# Patient Record
Sex: Female | Born: 1965
Health system: Southern US, Community
[De-identification: ages and names within clinical notes are randomized; demographics above are authoritative.]

## PROBLEM LIST (undated history)

## (undated) DIAGNOSIS — Z8042 Family history of malignant neoplasm of prostate: Secondary | ICD-10-CM

## (undated) DIAGNOSIS — F329 Major depressive disorder, single episode, unspecified: Secondary | ICD-10-CM

## (undated) DIAGNOSIS — F419 Anxiety disorder, unspecified: Secondary | ICD-10-CM

## (undated) DIAGNOSIS — C50411 Malignant neoplasm of upper-outer quadrant of right female breast: Principal | ICD-10-CM

## (undated) DIAGNOSIS — J302 Other seasonal allergic rhinitis: Secondary | ICD-10-CM

## (undated) DIAGNOSIS — Z9889 Other specified postprocedural states: Secondary | ICD-10-CM

## (undated) DIAGNOSIS — R112 Nausea with vomiting, unspecified: Secondary | ICD-10-CM

## (undated) DIAGNOSIS — I10 Essential (primary) hypertension: Secondary | ICD-10-CM

## (undated) DIAGNOSIS — Z803 Family history of malignant neoplasm of breast: Secondary | ICD-10-CM

## (undated) DIAGNOSIS — F32A Depression, unspecified: Secondary | ICD-10-CM

## (undated) HISTORY — DX: Depression, unspecified: F32.A

## (undated) HISTORY — PX: WISDOM TOOTH EXTRACTION: SHX21

## (undated) HISTORY — PX: FOOT NEUROMA SURGERY: SHX646

## (undated) HISTORY — DX: Anxiety disorder, unspecified: F41.9

## (undated) HISTORY — PX: TONSILLECTOMY: SUR1361

## (undated) HISTORY — DX: Family history of malignant neoplasm of breast: Z80.3

## (undated) HISTORY — DX: Major depressive disorder, single episode, unspecified: F32.9

## (undated) HISTORY — DX: Family history of malignant neoplasm of prostate: Z80.42

## (undated) HISTORY — DX: Malignant neoplasm of upper-outer quadrant of right female breast: C50.411

---

## 1998-10-22 ENCOUNTER — Other Ambulatory Visit: Admission: RE | Admit: 1998-10-22 | Discharge: 1998-10-22 | Payer: Self-pay

## 1999-11-29 ENCOUNTER — Other Ambulatory Visit: Admission: RE | Admit: 1999-11-29 | Discharge: 1999-11-29 | Payer: Self-pay | Admitting: Obstetrics and Gynecology

## 2000-12-01 ENCOUNTER — Other Ambulatory Visit: Admission: RE | Admit: 2000-12-01 | Discharge: 2000-12-01 | Payer: Self-pay | Admitting: Obstetrics and Gynecology

## 2001-12-02 ENCOUNTER — Other Ambulatory Visit: Admission: RE | Admit: 2001-12-02 | Discharge: 2001-12-02 | Payer: Self-pay | Admitting: Obstetrics and Gynecology

## 2002-12-05 ENCOUNTER — Other Ambulatory Visit: Admission: RE | Admit: 2002-12-05 | Discharge: 2002-12-05 | Payer: Self-pay | Admitting: Obstetrics and Gynecology

## 2003-12-15 ENCOUNTER — Other Ambulatory Visit: Admission: RE | Admit: 2003-12-15 | Discharge: 2003-12-15 | Payer: Self-pay | Admitting: Obstetrics and Gynecology

## 2005-11-24 ENCOUNTER — Ambulatory Visit (HOSPITAL_COMMUNITY): Admission: RE | Admit: 2005-11-24 | Discharge: 2005-11-24 | Payer: Self-pay | Admitting: *Deleted

## 2005-12-02 ENCOUNTER — Ambulatory Visit (HOSPITAL_COMMUNITY): Admission: RE | Admit: 2005-12-02 | Discharge: 2005-12-02 | Payer: Self-pay | Admitting: *Deleted

## 2005-12-08 ENCOUNTER — Inpatient Hospital Stay (HOSPITAL_COMMUNITY): Admission: AD | Admit: 2005-12-08 | Discharge: 2005-12-12 | Payer: Self-pay | Admitting: Obstetrics and Gynecology

## 2008-08-16 ENCOUNTER — Ambulatory Visit (HOSPITAL_COMMUNITY): Admission: RE | Admit: 2008-08-16 | Discharge: 2008-08-16 | Payer: Self-pay | Admitting: Obstetrics & Gynecology

## 2010-03-25 HISTORY — PX: ABDOMINAL HYSTERECTOMY: SHX81

## 2010-04-02 ENCOUNTER — Ambulatory Visit (HOSPITAL_COMMUNITY): Admission: RE | Admit: 2010-04-02 | Discharge: 2010-04-03 | Payer: Self-pay | Admitting: Obstetrics & Gynecology

## 2010-04-02 ENCOUNTER — Encounter (INDEPENDENT_AMBULATORY_CARE_PROVIDER_SITE_OTHER): Payer: Self-pay | Admitting: Obstetrics & Gynecology

## 2010-09-15 ENCOUNTER — Encounter: Payer: Self-pay | Admitting: *Deleted

## 2010-11-08 LAB — DIFFERENTIAL
Basophils Absolute: 0 10*3/uL (ref 0.0–0.1)
Eosinophils Absolute: 0.1 10*3/uL (ref 0.0–0.7)
Eosinophils Relative: 1 % (ref 0–5)

## 2010-11-08 LAB — CBC
HCT: 40.3 % (ref 36.0–46.0)
Hemoglobin: 12.3 g/dL (ref 12.0–15.0)
MCH: 33.5 pg (ref 26.0–34.0)
MCV: 95.7 fL (ref 78.0–100.0)
MCV: 96.2 fL (ref 78.0–100.0)
RBC: 4.18 MIL/uL (ref 3.87–5.11)
RDW: 13 % (ref 11.5–15.5)
WBC: 11.7 10*3/uL — ABNORMAL HIGH (ref 4.0–10.5)

## 2010-11-08 LAB — TYPE AND SCREEN: Antibody Screen: NEGATIVE

## 2010-11-08 LAB — ABO/RH: ABO/RH(D): A POS

## 2010-11-08 LAB — SURGICAL PCR SCREEN
MRSA, PCR: NEGATIVE
Staphylococcus aureus: POSITIVE — AB

## 2010-11-08 LAB — PREGNANCY, URINE: Preg Test, Ur: NEGATIVE

## 2011-01-07 NOTE — Op Note (Signed)
NAMEJACI, DESANTO             ACCOUNT NO.:  [id number]   MEDICAL RECORD NO.:  [id number]          PATIENT TYPE:  AMB   LOCATION:  SDC                           FACILITY:  [facility]   PHYSICIAN:  Genia Del, M.D.DATE OF BIRTH:  [birth year]   DATE OF PROCEDURE:  DATE OF DISCHARGE:                               OPERATIVE REPORT   PREOPERATIVE DIAGNOSIS:  Intrauterine polyp, irregular bleeding.   POSTOPERATIVE DIAGNOSIS:  Intrauterine polyp, irregular bleeding.   PROCEDURE:  Hysteroscopy, resection, and dilatation and curettage.   SURGEON:  Genia Del, MD   PROCEDURE:  Under MAC analgesia, the patient is in lithotomy position.  She is prepped with Betadine on the suprapubic, vulvar, and vaginal  areas.  The vaginal exam reveals an anteverted uterus slightly deviated  to the right, mobile, no adnexal mass.  We inserted the speculum in the  vagina.  We proceeded with a paracervical block with Nesacaine 1% 20 mL  total at 4 and 8 o'clock.  The anterior lip of the cervix was grasped  with a tenaculum.  Dilatation of the cervix at Hegar dilator #31 without  difficulty.  We then inserted the operative hysteroscope with 1 loop.  We visualized the intrauterine cavity.  Few polyps were present on the  anterior wall of the uterus.  We resected those polyp after taking  pictures.  We then completed a curettage of the entire intrauterine  cavity with a sharp curette a lot of endometrial curettings including  more polyps were removed.  The specimen was sent altogether to  Pathology.  We then went back with the hysteroscope, visualized the  intrauterine cavity.  No other lesion were present.  Hemostasis was  adequate.  The instruments were therefore removed.  The estimated blood  loss was minimal.  The fluid deficit was 180 mL.  No complications  occurred, and the patient was brought to recovery room in good stable  status.  A dose of Ancef 1 g IV was given at induction.      Genia Del, M.D.  Electronically Signed     ML/MEDQ  D:  08/16/2008  T:  08/16/2008  Job:  [id number]

## 2011-05-30 LAB — CBC
MCHC: 34.2 g/dL (ref 30.0–36.0)
MCV: 96.8 fL (ref 78.0–100.0)
Platelets: 353 10*3/uL (ref 150–400)
RDW: 12.4 % (ref 11.5–15.5)
WBC: 8.1 10*3/uL (ref 4.0–10.5)

## 2011-05-30 LAB — PREGNANCY, URINE: Preg Test, Ur: NEGATIVE

## 2012-06-22 ENCOUNTER — Ambulatory Visit (INDEPENDENT_AMBULATORY_CARE_PROVIDER_SITE_OTHER): Payer: PRIVATE HEALTH INSURANCE | Admitting: Family Medicine

## 2012-06-22 VITALS — BP 110/74 | HR 75 | Temp 98.0°F | Resp 16 | Ht 69.0 in | Wt 175.0 lb

## 2012-06-22 DIAGNOSIS — Z0000 Encounter for general adult medical examination without abnormal findings: Secondary | ICD-10-CM

## 2012-06-22 LAB — POCT URINALYSIS DIPSTICK
Bilirubin, UA: NEGATIVE
Blood, UA: NEGATIVE
Glucose, UA: NEGATIVE
Ketones, UA: NEGATIVE
Leukocytes, UA: NEGATIVE
Nitrite, UA: NEGATIVE
Protein, UA: NEGATIVE
Spec Grav, UA: 1.01
Urobilinogen, UA: 0.2
pH, UA: 7

## 2012-06-22 LAB — POCT CBC
Granulocyte percent: 69.3 %G (ref 37–80)
HCT, POC: 43.8 % (ref 37.7–47.9)
Hemoglobin: 13.5 g/dL (ref 12.2–16.2)
Lymph, poc: 1.3 (ref 0.6–3.4)
MCH, POC: 31.1 pg (ref 27–31.2)
MCHC: 30.8 g/dL — AB (ref 31.8–35.4)
MCV: 100.9 fL — AB (ref 80–97)
MID (cbc): 0.3 (ref 0–0.9)
MPV: 8.6 fL (ref 0–99.8)
POC Granulocyte: 3.7 (ref 2–6.9)
POC LYMPH PERCENT: 25.4 %L (ref 10–50)
POC MID %: 5.3 %M (ref 0–12)
Platelet Count, POC: 325 10*3/uL (ref 142–424)
RBC: 4.34 M/uL (ref 4.04–5.48)
RDW, POC: 13.1 %
WBC: 5.3 10*3/uL (ref 4.6–10.2)

## 2012-06-22 LAB — LIPID PANEL
Cholesterol: 203 mg/dL — ABNORMAL HIGH (ref 0–200)
HDL: 48 mg/dL (ref >39)
LDL Cholesterol: 135 mg/dL — ABNORMAL HIGH (ref 0–99)
Total CHOL/HDL Ratio: 4.2 Ratio
Triglycerides: 100 mg/dL (ref <150)
VLDL: 20 mg/dL (ref 0–40)

## 2012-06-22 LAB — COMPREHENSIVE METABOLIC PANEL
ALT: 14 U/L (ref 0–35)
AST: 16 U/L (ref 0–37)
Albumin: 4.5 g/dL (ref 3.5–5.2)
Alkaline Phosphatase: 39 U/L (ref 39–117)
BUN: 9 mg/dL (ref 6–23)
CO2: 27 mEq/L (ref 19–32)
Calcium: 9.6 mg/dL (ref 8.4–10.5)
Chloride: 103 mEq/L (ref 96–112)
Creat: 0.83 mg/dL (ref 0.50–1.10)
Glucose, Bld: 89 mg/dL (ref 70–99)
Potassium: 4.5 mEq/L (ref 3.5–5.3)
Sodium: 139 mEq/L (ref 135–145)
Total Bilirubin: 0.5 mg/dL (ref 0.3–1.2)
Total Protein: 6.8 g/dL (ref 6.0–8.3)

## 2012-06-22 LAB — POCT UA - MICROSCOPIC ONLY
Casts, Ur, LPF, POC: NEGATIVE
Crystals, Ur, HPF, POC: NEGATIVE
Mucus, UA: NEGATIVE
RBC, urine, microscopic: NEGATIVE
Yeast, UA: NEGATIVE

## 2012-06-22 LAB — TSH: TSH: 1.23 u[IU]/mL (ref 0.350–4.500)

## 2012-06-22 NOTE — Progress Notes (Signed)
@[email or web address] @  Patient ID: Caitlin Monroe MRN: [id number], DOB: 07/25/1966, 46 y.o. Date of Encounter: 06/22/2012, 8:30 AM  Primary Physician: No primary provider on file.  Chief Complaint: Physical (CPE)  HPI: 46 y.o. y/o female with history of noted below here for CPE.  Doing well. No issues/complaints.  Pap:  3 years MMG:  2012 Review of Systems: Consitutional: No fever, chills, fatigue, night sweats, lymphadenopathy, or weight changes. Eyes: No visual changes, eye redness, or discharge. ENT/Mouth: Ears: No otalgia, tinnitus, hearing loss, discharge. Nose: No congestion, rhinorrhea, sinus pain, or epistaxis. Throat: No sore throat, post nasal drip, or teeth pain. Cardiovascular: No CP, palpitations, diaphoresis, DOE, edema, orthopnea, PND. Respiratory: No cough, hemoptysis, SOB, or wheezing. Gastrointestinal: No anorexia, dysphagia, reflux, pain, nausea, vomiting, hematemesis, diarrhea, constipation, BRBPR, or melena. Breast: No discharge, pain, swelling, or mass. Genitourinary: No dysuria, frequency, urgency, hematuria, incontinence, nocturia, amenorrhea, vaginal discharge, pruritis, burning, abnormal bleeding, or pain. Musculoskeletal: No decreased ROM, myalgias, stiffness, joint swelling, or weakness. Skin: No rash, erythema, lesion changes, pain, warmth, jaundice, or pruritis. Neurological: No headache, dizziness, syncope, seizures, tremors, memory loss, coordination problems, or paresthesias. Psychological: No anxiety, depression, hallucinations, SI/HI. Endocrine: No fatigue, polydipsia, polyphagia, polyuria, or known diabetes. All other systems were reviewed and are otherwise negative.  Past Medical History  Diagnosis Date  . Depression      Past Surgical History  Procedure Date  . Abdominal hysterectomy     Home Meds:  Prior to Admission medications   Medication Sig Start Date End Date Taking? Authorizing Provider  buPROPion (WELLBUTRIN SR) 150 MG 12 hr tablet  Take 150 mg by mouth daily.   Yes Historical Provider, MD  sertraline (ZOLOFT) 100 MG tablet Take 150 mg by mouth daily.   Yes Historical Provider, MD    Allergies: No Known Allergies  History   Social History  . Marital Status: Married    Spouse Name: N/A    Number of Children: N/A  . Years of Education: N/A   Occupational History  . Not on file.   Social History Main Topics  . Smoking status: Former Smoker    Quit date: 06/23/1995  . Smokeless tobacco: Not on file  . Alcohol Use: Not on file  . Drug Use: Not on file  . Sexually Active: Not on file   Other Topics Concern  . Not on file   Social History Narrative  . No narrative on file    Family History  Problem Relation Age of Onset  . Hypertension Mother   . Cancer Father   . Mental illness Sister   . Cancer Brother   . Cancer Maternal Grandmother   . Cancer Maternal Grandfather   . Cancer Paternal Grandmother     Physical Exam: Blood pressure 110/74, pulse 75, temperature 98 F (36.7 C), temperature source Oral, resp. rate 16, height 5\' 9"  (1.753 m), weight 175 lb (79.379 kg), SpO2 99.00%., Body mass index is 25.84 kg/(m^2). General: Well developed, well nourished, in no acute distress. HEENT: Normocephalic, atraumatic. Conjunctiva pink, sclera non-icteric. Pupils 2 mm constricting to 1 mm, round, regular, and equally reactive to light and accomodation. EOMI. Internal auditory canal clear. TMs with good cone of light and without pathology. Nasal mucosa pink. Nares are without discharge. No sinus tenderness. Oral mucosa pink. Dentition excellent. Pharynx without exudate.   Neck: Supple. Trachea midline. No thyromegaly. Full ROM. No lymphadenopathy. Lungs: Clear to auscultation bilaterally without wheezes, rales, or rhonchi. Breathing is of normal effort and  unlabored. Cardiovascular: RRR with S1 S2. No murmurs, rubs, or gallops appreciated. Distal pulses 2+ symmetrically. No carotid or abdominal bruits. Breast:  Symmetrical. No masses. Nipples without discharge. Abdomen: Soft, non-tender, non-distended with normoactive bowel sounds. No hepatosplenomegaly or masses. No rebound/guarding. No CVA tenderness. Without hernias. Musculoskeletal: Full range of motion and 5/5 strength throughout. Without swelling, atrophy, tenderness, crepitus, or warmth. Extremities without clubbing, cyanosis, or edema. Calves supple. Skin: Warm and moist without erythema, ecchymosis, wounds, or rash. Neuro: A+Ox3. CN II-XII grossly intact. Moves all extremities spontaneously. Full sensation throughout. Normal gait. DTR 2+ throughout upper and lower extremities. Finger to nose intact. Psych:  Responds to questions appropriately with a normal affect.   Studies: CBC, CMET, Lipid, TSH, Vitamin D all pending.  UA:   Assessment/Plan:  46 y.o. y/o female here for CPE -  Signed, Elvina Sidle, MD 06/22/2012 8:30 AM

## 2012-08-16 ENCOUNTER — Ambulatory Visit (INDEPENDENT_AMBULATORY_CARE_PROVIDER_SITE_OTHER): Payer: PRIVATE HEALTH INSURANCE | Admitting: Family Medicine

## 2012-08-16 VITALS — BP 128/80 | HR 114 | Temp 99.5°F | Resp 16 | Ht 68.5 in | Wt 170.0 lb

## 2012-08-16 DIAGNOSIS — J029 Acute pharyngitis, unspecified: Secondary | ICD-10-CM

## 2012-08-16 MED ORDER — CEFDINIR 300 MG PO CAPS: 600.0000 mg | ORAL_CAPSULE | Freq: Every day | ORAL | Status: DC

## 2012-08-16 NOTE — Progress Notes (Signed)
Patient ID: Caitlin OLBERDING MRN: [id number], DOB: 06-01-1966, 46 y.o. Date of Encounter: 08/16/2012, 6:49 PM  Primary Physician: No primary provider on file.  Chief Complaint:  Chief Complaint  Patient presents with  . Sore Throat    Daughter has had strep 3 x in the last month and now she thinks she has it; started having a ST today  . Fever    was 100.8 at 330 and she took some ibuprofen    HPI: 46 y.o. year old female presents with day history of sore throat. Subjective fever and chills. No cough, congestion, rhinorrhea, sinus pressure, otalgia, or headache. Normal hearing. No GI complaints. Able to swallow saliva, but hurts to do so. Decreased appetite secondary to sore throat.   Past Medical History  Diagnosis Date  . Depression      Home Meds: Prior to Admission medications   Medication Sig Start Date End Date Taking? Authorizing Provider  buPROPion (WELLBUTRIN SR) 150 MG 12 hr tablet Take 150 mg by mouth daily.   Yes Historical Provider, MD  hydrOXYzine (ATARAX/VISTARIL) 25 MG tablet Take 25 mg by mouth 3 (three) times daily as needed.   Yes Historical Provider, MD  sertraline (ZOLOFT) 100 MG tablet Take 150 mg by mouth daily.   Yes Historical Provider, MD    Allergies: No Known Allergies  History   Social History  . Marital Status: Married    Spouse Name: N/A    Number of Children: N/A  . Years of Education: N/A   Occupational History  . Not on file.   Social History Main Topics  . Smoking status: Former Smoker    Quit date: 06/23/1995  . Smokeless tobacco: Not on file  . Alcohol Use: Not on file  . Drug Use: Not on file  . Sexually Active: Not on file   Other Topics Concern  . Not on file   Social History Narrative  . No narrative on file     Review of Systems: Constitutional: negative for chills, fever, night sweats or weight changes HEENT: see above Cardiovascular: negative for chest pain or palpitations Respiratory: negative for  hemoptysis, wheezing, or shortness of breath Abdominal: negative for abdominal pain, nausea, vomiting or diarrhea Dermatological: negative for rash Neurologic: negative for headache   Physical Exam: Blood pressure 128/80, pulse 114, temperature 99.5 F (37.5 C), resp. rate 16, height 5' 8.5" (1.74 m), weight 170 lb (77.111 kg)., Body mass index is 25.47 kg/(m^2). General: Well developed, well nourished, in no acute distress. Head: Normocephalic, atraumatic, eyes without discharge, sclera non-icteric, nares are patent. Bilateral auditory canals clear, TM's are without perforation, pearly grey with reflective cone of light bilaterally. No sinus TTP. Oral cavity moist, dentition normal. Posterior pharynx with post nasal drip and mild erythema. No peritonsillar abscess or tonsillar exudate. Neck: Supple. No thyromegaly. Full ROM. No lymphadenopathy. Lungs: Clear bilaterally to auscultation without wheezes, rales, or rhonchi. Breathing is unlabored. Heart: RRR with S1 S2. No murmurs, rubs, or gallops appreciated. Abdomen: Soft, non-tender, non-distended with normoactive bowel sounds. No hepatomegaly. No rebound/guarding. No obvious abdominal masses. Msk:  Strength and tone normal for age. Extremities: No clubbing or cyanosis. No edema. Neuro: Alert and oriented X 3. Moves all extremities spontaneously. CNII-XII grossly in tact. Psych:  Responds to questions appropriately with a normal affect.      ASSESSMENT AND PLAN:  46 y.o. year old female with  - -Tylenol/Motrin prn -Rest/fluids -RTC precautions -RTC 3-5 days if no  improvement  Signed, Elvina Sidle, MD 08/16/2012 6:49 PM

## 2012-08-16 NOTE — Patient Instructions (Signed)

## 2012-08-20 LAB — CULTURE, GROUP A STREP: Organism ID, Bacteria: NORMAL

## 2013-03-17 ENCOUNTER — Telehealth: Payer: Self-pay | Admitting: Radiology

## 2013-03-17 NOTE — Telephone Encounter (Signed)
Medical Records: Can we please provide this for the pt and give her a call once completed? Thank you so much!  The following email was submitted to your website from [employer] I would like to get a copy of my most recent physical (06/22/12), which I need to submit for matriculation into graduate school. What steps do I need to take to get access to this? Thank you!

## 2013-03-18 NOTE — Telephone Encounter (Signed)
In the mailbox waiting to be mailed. Notified patient and she gave verbal consent to have them mailed and processed for graduate school.

## 2013-04-05 ENCOUNTER — Ambulatory Visit (INDEPENDENT_AMBULATORY_CARE_PROVIDER_SITE_OTHER): Payer: PRIVATE HEALTH INSURANCE | Admitting: Family Medicine

## 2013-04-05 ENCOUNTER — Encounter: Payer: Self-pay | Admitting: Family Medicine

## 2013-04-05 VITALS — BP 112/68 | HR 88 | Temp 98.1°F | Resp 16 | Ht 68.5 in | Wt 171.0 lb

## 2013-04-05 DIAGNOSIS — Z130 Encounter for screening for diseases of the blood and blood-forming organs and certain disorders involving the immune mechanism: Secondary | ICD-10-CM

## 2013-04-05 DIAGNOSIS — F418 Other specified anxiety disorders: Secondary | ICD-10-CM | POA: Insufficient documentation

## 2013-04-05 DIAGNOSIS — Z23 Encounter for immunization: Secondary | ICD-10-CM

## 2013-04-05 DIAGNOSIS — J309 Allergic rhinitis, unspecified: Secondary | ICD-10-CM | POA: Insufficient documentation

## 2013-04-05 NOTE — Progress Notes (Signed)
S:  This 47 y.o. Cauc female is here for [employer] pre-admission immunizations. She had CPE at [facility] in October 2013; exam was unremarkable. Pt requests Hepatitis B series and she needs a Tdap today. She will need titers for MMR and Varicella. She reports no new health issues since CPE. We briefly discussed her lipid panel results which showed and elevated LDL; pt thinks this is genetic. She and her husband practice good nutrition w/ fish oil and whole grain cereals.  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  PMHx, Soc Hx and Fam Hx reviewed.  ROS: Noncontributory.  O: Filed Vitals:   04/05/13 1431  BP: 112/68  Pulse: 88  Temp: 98.1 F (36.7 C)  Resp: 16   GEN: In NAD; WN,WD. HENT: [place]/AT; EOMI w/ clear conj/sclerae. EACs/nose/oroph clear. SKIN: W&D; no rashes, erythema, jaundice or pallor. COR: RRR. LUNGS: Normal resp rate and effort. MS: MAEs; no joint deformities, c/c/e. NEURO: A&O x 3; CNs intact. Nonfocal.  A/P: Need for prophylactic vaccination with combined diphtheria-tetanus-pertussis (DTP) vaccine - Plan: Tdap vaccine greater than or equal to 7yo IM  Need for prophylactic vaccination and inoculation against viral hepatitis - Plan: Hepatitis B vaccine adult IM; pt will return in 2 months for Hep B #2 and in 6 months for Hep B #3.  Screening for other and unspecified endocrine, nutritional, metabolic, and immunity disorders - Plan: Varicella zoster antibody, IgG, Measles/Mumps/Rubella Immunity

## 2013-04-05 NOTE — Patient Instructions (Signed)
You received Tdap and Hepatitis B #1 vaccines today. Return to [facility] in 2 months for Hep B #2 and in 6 months for Hep B #3.

## 2013-04-06 LAB — MEASLES/MUMPS/RUBELLA IMMUNITY: Rubeola IgG: 77.5 AU/mL — ABNORMAL HIGH (ref <25.00)

## 2014-07-19 ENCOUNTER — Ambulatory Visit (INDEPENDENT_AMBULATORY_CARE_PROVIDER_SITE_OTHER): Payer: PRIVATE HEALTH INSURANCE | Admitting: Family Medicine

## 2014-07-19 VITALS — BP 134/84 | HR 86 | Temp 98.0°F | Resp 16 | Ht 68.5 in | Wt 184.4 lb

## 2014-07-19 DIAGNOSIS — Z130 Encounter for screening for diseases of the blood and blood-forming organs and certain disorders involving the immune mechanism: Secondary | ICD-10-CM | POA: Diagnosis not present

## 2014-07-19 DIAGNOSIS — E559 Vitamin D deficiency, unspecified: Secondary | ICD-10-CM

## 2014-07-19 DIAGNOSIS — Z0000 Encounter for general adult medical examination without abnormal findings: Secondary | ICD-10-CM

## 2014-07-19 DIAGNOSIS — Z1329 Encounter for screening for other suspected endocrine disorder: Secondary | ICD-10-CM

## 2014-07-19 DIAGNOSIS — Z13220 Encounter for screening for lipoid disorders: Secondary | ICD-10-CM | POA: Diagnosis not present

## 2014-07-19 LAB — POCT CBC
Granulocyte percent: 71.8 % (ref 37–80)
HCT, POC: 42.8 % (ref 37.7–47.9)
Hemoglobin: 14.4 g/dL (ref 12.2–16.2)
Lymph, poc: 1.8 (ref 0.6–3.4)
MCH, POC: 32 pg — AB (ref 27–31.2)
MCHC: 33.5 g/dL (ref 31.8–35.4)
MCV: 95.3 fL (ref 80–97)
MID (cbc): 0.1 (ref 0–0.9)
MPV: 6.8 fL (ref 0–99.8)
POC Granulocyte: 4.8 (ref 2–6.9)
POC LYMPH PERCENT: 36.7 % (ref 10–50)
POC MID %: 1.5 % (ref 0–12)
Platelet Count, POC: 350 10*3/uL (ref 142–424)
RBC: 4.5 M/uL (ref 4.04–5.48)
RDW, POC: 12.8 %
WBC: 6.7 10*3/uL (ref 4.6–10.2)

## 2014-07-19 LAB — COMPLETE METABOLIC PANEL WITH GFR
BUN: 13 mg/dL (ref 6–23)
CO2: 26 mEq/L (ref 19–32)
Chloride: 104 mEq/L (ref 96–112)
GFR, Est African American: >89 mL/min
Glucose, Bld: 84 mg/dL (ref 70–99)
Potassium: 4.6 mEq/L (ref 3.5–5.3)
Sodium: 138 mEq/L (ref 135–145)
Total Bilirubin: 0.6 mg/dL (ref 0.2–1.2)

## 2014-07-19 LAB — COMPLETE METABOLIC PANEL WITHOUT GFR
ALT: 15 U/L (ref 0–35)
AST: 18 U/L (ref 0–37)
Albumin: 4.5 g/dL (ref 3.5–5.2)
Alkaline Phosphatase: 41 U/L (ref 39–117)
Calcium: 9.4 mg/dL (ref 8.4–10.5)
Creat: 0.77 mg/dL (ref 0.50–1.10)
GFR, Est Non African American: >89 mL/min
Total Protein: 7.2 g/dL (ref 6.0–8.3)

## 2014-07-19 LAB — LIPID PANEL
Cholesterol: 244 mg/dL — ABNORMAL HIGH (ref 0–200)
HDL: 55 mg/dL (ref >39)
LDL Cholesterol: 159 mg/dL — ABNORMAL HIGH (ref 0–99)
Total CHOL/HDL Ratio: 4.4 ratio
Triglycerides: 148 mg/dL (ref <150)
VLDL: 30 mg/dL (ref 0–40)

## 2014-07-19 LAB — TSH: TSH: 1.014 u[IU]/mL (ref 0.350–4.500)

## 2014-07-19 LAB — VITAMIN D 25 HYDROXY (VIT D DEFICIENCY, FRACTURES): Vit D, 25-Hydroxy: 39 ng/mL (ref 30–100)

## 2014-07-19 NOTE — Progress Notes (Signed)
Chief Complaint:  Chief Complaint  Patient presents with  . Annual Exam    wants to get a lipid and cmp--for insurance purposes    HPI: Caitlin Monroe is a 48 y.o. female who is here for  An annual exam Has had hysterectomy for Benign fibroid,  no paps are needed Last saw her Ob/gyn in October, had breast exam done then, UTD on Mammogram  Doing well overall, no complaitns.  Eat well healthy vegetable , exercise 2 times a week, she is law school.  She is in Sports coach school, want to pursue Possibly immigration.women's right UTD on flu vaccine UTD on TdaP    Past Medical History  Diagnosis Date  . Depression   . Allergy   . Anxiety    Past Surgical History  Procedure Laterality Date  . Abdominal hysterectomy  Aug 2011    [employer] TAH for fibroids   History   Social History  . Marital Status: Married    Spouse Name: N/A    Number of Children: N/A  . Years of Education: N/A   Occupational History  . Law Student    Social History Main Topics  . Smoking status: Former Smoker    Quit date: 06/23/1995  . Smokeless tobacco: None  . Alcohol Use: No  . Drug Use: No  . Sexual Activity: Yes   Other Topics Concern  . None   Social History Narrative   Married. Education: [employer]. Exercise: Gym 3 times a week for 45 minutes.                        Family History  Problem Relation Age of Onset  . Hypertension Mother   . Depression Mother   . Cancer Father     prostate  . Mental illness Sister   . Cancer Brother   . Cancer Maternal Grandmother   . Cancer Maternal Grandfather   . Heart disease Maternal Grandfather   . Cancer Paternal Grandmother     lung  . Cancer Paternal Grandfather   . Allergies Daughter   . Anxiety disorder Daughter    No Known Allergies Prior to Admission medications   Medication Sig Start Date End Date Taking? Authorizing Provider  buPROPion (WELLBUTRIN SR) 150 MG 12 hr tablet Take 150 mg by mouth daily.   Yes Historical  Provider, MD  hydrOXYzine (ATARAX/VISTARIL) 25 MG tablet Take 25 mg by mouth 3 (three) times daily as needed.   Yes Historical Provider, MD  sertraline (ZOLOFT) 100 MG tablet Take 150 mg by mouth daily.   Yes Historical Provider, MD     ROS: The patient denies fevers, chills, night sweats, unintentional weight loss, chest pain, palpitations, wheezing, dyspnea on exertion, nausea, vomiting, abdominal pain, dysuria, hematuria, melena, numbness, weakness, or tingling.   All other systems have been reviewed and were otherwise negative with the exception of those mentioned in the HPI and as above.    PHYSICAL EXAM: Filed Vitals:   07/19/14 0811  BP: 134/84  Pulse: 86  Temp: 98 F (36.7 C)  Resp: 16   Filed Vitals:   07/19/14 0811  Height: 5' 8.5" (1.74 m)  Weight: 184 lb 6.4 oz (83.643 kg)   Body mass index is 27.63 kg/(m^2).  General: Alert, no acute distress HEENT:  Normocephalic, atraumatic, oropharynx patent. EOMI, PERRLA. EOMI, PERRLA, fundo exam normal, neg thryoidmegaly Cardiovascular:  Regular rate and rhythm, no rubs murmurs or gallops.  No Carotid bruits, radial pulse intact. No pedal edema.  Respiratory: Clear to auscultation bilaterally.  No wheezes, rales, or rhonchi.  No cyanosis, no use of accessory musculature GI: No organomegaly, abdomen is soft and non-tender, positive bowel sounds.  No masses. Skin: No rashes. Neurologic: Facial musculature symmetric. Psychiatric: Patient is appropriate throughout our interaction. Lymphatic: No cervical lymphadenopathy Musculoskeletal: Gait intact. UE and LE normal, /5/ sterngth, 2/2 DTRS   LABS: Results for orders placed or performed in visit on 04/05/13  Varicella zoster antibody, IgG  Result Value Ref Range   Varicella IgG 214.30 (H) <135.00 Index  Measles/Mumps/Rubella Immunity  Result Value Ref Range   Rubella 1.27 (H) <0.90 Index   Mumps IgG 48.00 (H) <9.00 AU/mL   Rubeola IgG 77.50 (H) <25.00 AU/mL     EKG/XRAY:    Primary read interpreted by Dr. Marin Comment at [facility].   ASSESSMENT/PLAN: Encounter Diagnoses  Name Primary?  . Annual physical exam Yes  . Screening for deficiency anemia   . Screening for hyperlipidemia   . Screening for thyroid disorder   . Vitamin D deficiency    Annual labs pending Declined Flu F/u prn , otherwise in 1 year  Gross sideeffects, risk and benefits, and alternatives of medications d/w patient. Patient is aware that all medications have potential sideeffects and we are unable to predict every sideeffect or drug-drug interaction that may occur.  LE, [place], DO 07/19/2014 9:11 AM

## 2015-06-27 ENCOUNTER — Encounter: Payer: Self-pay | Admitting: Family Medicine

## 2015-06-27 ENCOUNTER — Ambulatory Visit (INDEPENDENT_AMBULATORY_CARE_PROVIDER_SITE_OTHER): Payer: PRIVATE HEALTH INSURANCE | Admitting: Family Medicine

## 2015-06-27 VITALS — BP 126/78 | HR 80 | Temp 98.4°F | Resp 16 | Ht 68.5 in | Wt 184.8 lb

## 2015-06-27 DIAGNOSIS — Z0000 Encounter for general adult medical examination without abnormal findings: Secondary | ICD-10-CM

## 2015-06-27 DIAGNOSIS — Z1329 Encounter for screening for other suspected endocrine disorder: Secondary | ICD-10-CM | POA: Diagnosis not present

## 2015-06-27 DIAGNOSIS — Z13220 Encounter for screening for lipoid disorders: Secondary | ICD-10-CM | POA: Diagnosis not present

## 2015-06-27 DIAGNOSIS — Z1231 Encounter for screening mammogram for malignant neoplasm of breast: Secondary | ICD-10-CM

## 2015-06-27 DIAGNOSIS — Z130 Encounter for screening for diseases of the blood and blood-forming organs and certain disorders involving the immune mechanism: Secondary | ICD-10-CM

## 2015-06-27 DIAGNOSIS — Z131 Encounter for screening for diabetes mellitus: Secondary | ICD-10-CM

## 2015-06-27 DIAGNOSIS — Z1321 Encounter for screening for nutritional disorder: Secondary | ICD-10-CM | POA: Diagnosis not present

## 2015-06-27 DIAGNOSIS — F338 Other recurrent depressive disorders: Secondary | ICD-10-CM | POA: Insufficient documentation

## 2015-06-27 DIAGNOSIS — F32A Depression, unspecified: Secondary | ICD-10-CM | POA: Insufficient documentation

## 2015-06-27 DIAGNOSIS — F39 Unspecified mood [affective] disorder: Secondary | ICD-10-CM | POA: Diagnosis not present

## 2015-06-27 DIAGNOSIS — F329 Major depressive disorder, single episode, unspecified: Secondary | ICD-10-CM | POA: Diagnosis not present

## 2015-06-27 LAB — CBC
HEMATOCRIT: 40.9 % (ref 36.0–46.0)
Hemoglobin: 13.7 g/dL (ref 12.0–15.0)
MCH: 32.5 pg (ref 26.0–34.0)
MCHC: 33.5 g/dL (ref 30.0–36.0)
MCV: 96.9 fL (ref 78.0–100.0)
MPV: 9.1 fL (ref 8.6–12.4)
PLATELETS: 339 10*3/uL (ref 150–400)
RBC: 4.22 MIL/uL (ref 3.87–5.11)
RDW: 13.1 % (ref 11.5–15.5)
WBC: 5.4 10*3/uL (ref 4.0–10.5)

## 2015-06-27 LAB — COMPREHENSIVE METABOLIC PANEL
ALK PHOS: 39 U/L (ref 33–115)
ALT: 14 U/L (ref 6–29)
AST: 19 U/L (ref 10–35)
Albumin: 4.6 g/dL (ref 3.6–5.1)
BUN: 14 mg/dL (ref 7–25)
CALCIUM: 9.1 mg/dL (ref 8.6–10.2)
CO2: 25 mmol/L (ref 20–31)
Chloride: 104 mmol/L (ref 98–110)
Creat: 0.85 mg/dL (ref 0.50–1.10)
GLUCOSE: 98 mg/dL (ref 65–99)
POTASSIUM: 4.4 mmol/L (ref 3.5–5.3)
Sodium: 139 mmol/L (ref 135–146)
Total Bilirubin: 0.7 mg/dL (ref 0.2–1.2)
Total Protein: 7 g/dL (ref 6.1–8.1)

## 2015-06-27 LAB — LIPID PANEL
CHOL/HDL RATIO: 4.6 ratio (ref <=5.0)
Cholesterol: 255 mg/dL — ABNORMAL HIGH (ref 125–200)
HDL: 55 mg/dL (ref >=46)
LDL CALC: 172 mg/dL — AB (ref <130)
Triglycerides: 140 mg/dL (ref <150)
VLDL: 28 mg/dL (ref <30)

## 2015-06-27 LAB — HEMOGLOBIN A1C
Hgb A1c MFr Bld: 5.3 % (ref <5.7)
Mean Plasma Glucose: 105 mg/dL (ref <117)

## 2015-06-27 LAB — TSH: TSH: 1.048 u[IU]/mL (ref 0.350–4.500)

## 2015-06-27 MED ORDER — BUPROPION HCL ER (SR) 100 MG PO TB12: 100.0000 mg | ORAL_TABLET | Freq: Every day | ORAL | Status: DC

## 2015-06-27 NOTE — Patient Instructions (Addendum)
It was good to see you today!   I will be in touch with your labs asap Continue to take good care of yourself as the days get shorter- if you need any help please let us know When you turn 50 it is time to get a colonoscopy- this can be scheduled with the GI doctor of your choice-  Consider [place], [place], [facility]   I refilled your wellbutrin SR- this is typically taken twice a day, but if once a day is working for you that is fine

## 2015-06-27 NOTE — Addendum Note (Signed)
Addended by: Wyatt Haste on: 06/27/2015 03:23 PM   Modules accepted: Orders

## 2015-06-27 NOTE — Progress Notes (Signed)
[facility] [address] [place] [address] [phone]  Date:  06/27/2015   Name:  Caitlin Monroe   DOB:  11-08-1965   MRN:  [id number]  PCP:  No primary care provider on file.    Chief Complaint: Annual Exam; Medication Refill; and Depression scale during triage   History of Present Illness:  Caitlin Monroe is a 49 y.o. very pleasant female patient who presents with the following:  Here today for a CPE History of abd hysterectomy in 2011 for fibroids.   She is on zoloft and also wellbutrin - she is on the wellbutrin SR but only takes it once a day. This is working well for her  Last seen here about a year ago She had a mammo per [facility].  This was done around this time last year and she will update this soon  She is fasting today for labs  She does tend to have some trouble with depression more around this time of year.  Her sister had bipolar disorder and committed suicide about 2 years ago. She herself does not have bipolar, but she is very careful with herself and monitors her depression sx closely  She uses a light box, exercises, does not drink, smoke or use any drugs.    She is studying law, and also has a 65 year old son who has some behavorial issues.  She still sees her OB-GYN yearly   Patient Active Problem List   Diagnosis Date Noted  . Allergic rhinitis 04/05/2013  . Depression with anxiety 04/05/2013    Past Medical History  Diagnosis Date  . Depression   . Allergy   . Anxiety     Past Surgical History  Procedure Laterality Date  . Abdominal hysterectomy  Aug 2011    Da Vinci TAH for fibroids    Social History  Substance Use Topics  . Smoking status: Former Smoker    Quit date: 06/23/1995  . Smokeless tobacco: None  . Alcohol Use: No    Family History  Problem Relation Age of Onset  . Hypertension Mother   . Depression Mother   . Hyperlipidemia Mother   . Heart disease Mother   . Cancer Father    prostate  . Mental illness Sister   . Hypertension Sister   . Cancer Brother   . Cancer Maternal Grandmother   . Mental illness Maternal Grandmother   . Cancer Maternal Grandfather   . Heart disease Maternal Grandfather   . Mental illness Maternal Grandfather   . Cancer Paternal Grandmother     lung  . Cancer Paternal Grandfather   . Allergies Daughter   . Anxiety disorder Daughter     No Known Allergies  Medication list has been reviewed and updated.  Current Outpatient Prescriptions on File Prior to Visit  Medication Sig Dispense Refill  . buPROPion (WELLBUTRIN SR) 150 MG 12 hr tablet Take 150 mg by mouth daily.    . hydrOXYzine (ATARAX/VISTARIL) 25 MG tablet Take 25 mg by mouth 3 (three) times daily as needed.    . sertraline (ZOLOFT) 100 MG tablet Take 150 mg by mouth daily.     No current facility-administered medications on file prior to visit.    Review of Systems:  As per HPI- otherwise negative.   Physical Examination: Filed Vitals:   06/27/15 0958  BP: 126/78  Pulse: 104  Temp: 98.4 F (36.9 C)  Resp: 16   Filed Vitals:  06/27/15 0958  Height: 5' 8.5" (1.74 m)  Weight: 184 lb 12.8 oz (83.825 kg)   Body mass index is 27.69 kg/(m^2). Ideal Body Weight: Weight in (lb) to have BMI = 25: 166.5  GEN: WDWN, NAD, Non-toxic, A & O x 3, mild overweight, looks well HEENT: Atraumatic, Normocephalic. Neck supple. No masses, No LAD.  Bilateral TM wnl, oropharynx normal.  PEERL,EOMI.   Ears and Nose: No external deformity. CV: RRR, No M/G/R. No JVD. No thrill. No extra heart sounds. PULM: CTA B, no wheezes, crackles, rhonchi. No retractions. No resp. distress. No accessory muscle use. ABD: S, NT, ND. No rebound. No HSM. EXTR: No c/c/e NEURO Normal gait.  PSYCH: Normally interactive. Conversant. Not depressed or anxious appearing.  Calm demeanor.    Assessment and Plan: Physical exam  Seasonal affective disorder ([place])  Encounter for vitamin deficiency  screening - Plan: Vitamin D, 25-hydroxy  Screening for hyperlipidemia - Plan: Lipid panel  Screening for diabetes mellitus - Plan: Comprehensive metabolic panel, Hemoglobin A1c  Screening for hypothyroidism - Plan: TSH  Screening for deficiency anemia - Plan: CBC  Depression - Plan: buPROPion (WELLBUTRIN SR) 100 MG 12 hr tablet   Noted a suspicious mole on her right upper back- she will see her derm soon to have this removed  Otherwise screening labs as above She sees a mental health professional but does need a RF of her wellbutrin so did this for her today See patient instructions for more details.     Signed Lamar Blinks, MD

## 2015-06-28 LAB — VITAMIN D 25 HYDROXY (VIT D DEFICIENCY, FRACTURES): VIT D 25 HYDROXY: 38 ng/mL (ref 30–100)

## 2015-10-24 ENCOUNTER — Ambulatory Visit: Payer: PRIVATE HEALTH INSURANCE | Admitting: Family Medicine

## 2016-01-31 ENCOUNTER — Encounter: Payer: Self-pay | Admitting: Family Medicine

## 2016-01-31 ENCOUNTER — Ambulatory Visit (INDEPENDENT_AMBULATORY_CARE_PROVIDER_SITE_OTHER): Payer: PRIVATE HEALTH INSURANCE | Admitting: Family Medicine

## 2016-01-31 VITALS — BP 103/72 | HR 96 | Temp 98.6°F | Resp 16 | Ht 68.5 in | Wt 186.6 lb

## 2016-01-31 DIAGNOSIS — Z0000 Encounter for general adult medical examination without abnormal findings: Secondary | ICD-10-CM | POA: Diagnosis not present

## 2016-01-31 NOTE — Patient Instructions (Addendum)
IF you received an x-ray today, you will receive an invoice from [facility]. Please contact [facility] at [phone] with questions or concerns regarding your invoice.   IF you received labwork today, you will receive an invoice from [employer]. Please contact [facility] at [phone] with questions or concerns regarding your invoice.   Our billing staff will not be able to assist you with questions regarding bills from these companies.  You will be contacted with the lab results as soon as they are available. The fastest way to get your results is to activate your My Chart account. Instructions are located on the last page of this paperwork. If you have not heard from [place] regarding the results in 2 weeks, please contact this office.    No change in meds for now. Can recheck lipids once you are back into your normal routine. If more recent labs needed for your paperwork - let me know and I can place a "lab only order" for you to return at your convenience.  Your neck pain may be due to a pinched nerve or nerve pain from the "brachial plexus".  No changes in treatment at this time, but you can follow up at your convenience to examine this further if needed.  Good luck on the exam!    Keeping You Healthy  Get These Tests  Blood Pressure- Have your blood pressure checked by your healthcare provider at least once a year.  Normal blood pressure is 120/80.  Weight- Have your body mass index (BMI) calculated to screen for obesity.  BMI is a measure of body fat based on height and weight.  You can calculate your own BMI at [employer]vel[employer].it  Cholesterol- Have your cholesterol checked every year.  Diabetes- Have your blood sugar checked every year if you have high blood pressure, high cholesterol, a family history of diabetes or if you are overweight.  Pap Test - Have a pap test every 1 to 5 years if you have been sexually active.  If  you are older than 65 and recent pap tests have been normal you may not need additional pap tests.  In addition, if you have had a hysterectomy  for benign disease additional pap tests are not necessary.  Mammogram-Yearly mammograms are essential for early detection of breast cancer  Screening for Colon Cancer- Colonoscopy starting at age 3. Screening may begin sooner depending on your family history and other health conditions.  Follow up colonoscopy as directed by your Gastroenterologist.  Screening for Osteoporosis- Screening begins at age 47 with bone density scanning, sooner if you are at higher risk for developing Osteoporosis.  Get these medicines  Calcium with Vitamin D- Your body requires 1200-1500 mg of Calcium a day and [phone] IU of Vitamin D a day.  You can only absorb 500 mg of Calcium at a time therefore Calcium must be taken in 2 or 3 separate doses throughout the day.  Hormones- Hormone therapy has been associated with increased risk for certain cancers and heart disease.  Talk to your healthcare provider about if you need relief from menopausal symptoms.  Aspirin- Ask your healthcare provider about taking Aspirin to prevent Heart Disease and Stroke.  Get these Immuniztions  Flu shot- Every fall  Pneumonia shot- Once after the age of 85; if you are younger ask your healthcare provider if you need a pneumonia shot.  Tetanus- Every ten years.  Zostavax- Once after the age of 71 to prevent  shingles.  Take these steps  Don't smoke- Your healthcare provider can help you quit. For tips on how to quit, ask your healthcare provider or go to www.smokefree.gov or call 1-800 QUIT-NOW.  Be physically active- Exercise 5 days a week for a minimum of 30 minutes.  If you are not already physically active, start slow and gradually work up to 30 minutes of moderate physical activity.  Try walking, dancing, bike riding, swimming, etc.  Eat a healthy diet- Eat a variety of healthy  foods such as fruits, vegetables, whole grains, low fat milk, low fat cheeses, yogurt, lean meats, chicken, fish, eggs, dried beans, tofu, etc.  For more information go to www.thenutritionsource.org  Dental visit- Brush and floss teeth twice daily; visit your dentist twice a year.  Eye exam- Visit your Optometrist or Ophthalmologist yearly.  Drink alcohol in moderation- Limit alcohol intake to one drink or less a day.  Never drink and drive.  Depression- Your emotional health is as important as your physical health.  If you're feeling down or losing interest in things you normally enjoy, please talk to your healthcare provider.  Seat Belts- can save your life; always wear one  Smoke/Carbon Monoxide detectors- These detectors need to be installed on the appropriate level of your home.  Replace batteries at least once a year.  Violence- If anyone is threatening or hurting you, please tell your healthcare provider.  Living Will/ Health care power of attorney- Discuss with your healthcare provider and family.

## 2016-01-31 NOTE — Progress Notes (Addendum)
Subjective:  By signing my name below, I, [name], attest that this documentation has been prepared under the direction and in the presence of Caitlin Ray, MD. Electronically Signed: [name], [place]. 01/31/2016 , 3:46 PM .  Patient was seen in Room 27 .   Patient ID: Caitlin Monroe, female    DOB: 04/29/1966, 50 y.o.   MRN: [id number] Chief Complaint  Patient presents with  . Annual Exam    NO PAP SMEAR   HPI Caitlin Monroe is a 50 y.o. female Here for annual physical. Previous patient of Dr. Marin Comment, and then Dr. Lorelei Pont, with last physical in November 2016.  She has history of depression/seasonal affective disorder and HLD. She isn't fasting today.   She's studying for the [employer] exam now. She recently graduated from [employer].   Mole Removal Patient mentions she had a mole removed. It resulted as non-malignant mole, removed Nov-Dec 2016.   Cancer Screening Colonoscopy: recommended scheduling at last physical. 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  Cervical cancer screening: hysterectomy for benign reasons, in 2011 for fibroids.  Breast cancer screening: mammogram previously had been done by [facility]. Last mammogram done in May.   Immunizations Immunization History  Administered Date(s) Administered  . Hepatitis B, adult 04/05/2013  . Influenza-Unspecified 06/20/2015  . Td 11/21/2002  . Tdap 04/05/2013    Depression Depression screen [facility] 2/9 01/31/2016 06/27/2015  Decreased Interest 0 1  Down, Depressed, Hopeless 0 1  PHQ - 2 Score 0 2  Altered sleeping - 0  Tired, decreased energy - 0  Change in appetite - 1  Feeling bad or failure about yourself  - 0  Trouble concentrating - 0  Moving slowly or fidgety/restless - 0  Suicidal thoughts - 0  PHQ-9 Score - 3  Difficult doing work/chores - Not difficult at all   She has had history of depression, usually seasonally. She had a sister who committed suicide about 2-3 years ago who had bipolar  disorder per review of previous note. Patient without symptoms of bipolar. And uses light therapy during the winter, exercising as well as zoloft 150mg  qd and wellbutrin 100mg  qd.   She decreases her zoloft to 100mg  qd during the summer but bumps up to zoloft 200mg  qd during the winter. She's also taking wellbutrin 100mg  qd. She also mentions going to [place]. She mentions having emetophobia.   Alcohol She was drinking out of control in the past, but has been sober for 21 years now. She notes taking a Lavender extract.   Vision  Visual Acuity Screening   Right eye Left eye Both eyes  Without correction:     With correction: 20/20 20/20 20/20    She sees them once a year.   Dentist She sees dentist every 6 months.   Exercise She has a fitbit and gets about 10k steps a day. She states she's doing less exercises at the moment, due to studying for her [employer] exam.   HLD Lab Results  Component Value Date   CHOL 255* 06/27/2015   HDL 55 06/27/2015   LDLCALC 172* 06/27/2015   TRIG 140 06/27/2015   CHOLHDL 4.6 06/27/2015   Lab Results  Component Value Date   ALT 14 06/27/2015   AST 19 06/27/2015   ALKPHOS 39 06/27/2015   BILITOT 0.7 06/27/2015   Total ratio was overall normal. Advised weight loss of 5-10 lbs initially.   Patient Active Problem List   Diagnosis  Date Noted  . Depression 06/27/2015  . Seasonal affective disorder ([place]) 06/27/2015  . Allergic rhinitis 04/05/2013  . Depression with anxiety 04/05/2013   Past Medical History  Diagnosis Date  . Depression   . Allergy   . Anxiety    Past Surgical History  Procedure Laterality Date  . Abdominal hysterectomy  Aug 2011    [employer] TAH for fibroids   No Known Allergies Prior to Admission medications   Medication Sig Start Date End Date Taking? Authorizing Provider  buPROPion (WELLBUTRIN SR) 100 MG 12 hr tablet Take 1 tablet (100 mg total) by mouth daily. 06/27/15   [name], MD    cholecalciferol (VITAMIN D) 1000 UNITS tablet Take 1,000 Units by mouth daily.    Historical Provider, MD  hydrOXYzine (ATARAX/VISTARIL) 25 MG tablet Take 25 mg by mouth 3 (three) times daily as needed.    Historical Provider, MD  loratadine (CLARITIN) 10 MG tablet Take 10 mg by mouth daily.    Historical Provider, MD  MAGNESIUM CARBONATE PO Take by mouth.    Historical Provider, MD  Omega-3 Fatty Acids (FISH OIL PO) Take by mouth.    Historical Provider, MD  sertraline (ZOLOFT) 100 MG tablet Take 150 mg by mouth daily.    Historical Provider, MD  VITAMIN K PO Take by mouth.    Historical Provider, MD   Social History   Social History  . Marital Status: Married    Spouse Name: N/A  . Number of Children: N/A  . Years of Education: N/A   Occupational History  . Law Student    Social History Main Topics  . Smoking status: Former Smoker -- 15 years    Types: Cigarettes    Quit date: 06/23/1995  . Smokeless tobacco: Not on file  . Alcohol Use: No  . Drug Use: No  . Sexual Activity: Yes   Other Topics Concern  . Not on file   Social History Narrative   Married. Education: [employer]. Exercise: Gym 2 times a week fit walking.                         Review of Systems 13 point ROS - positive for seasonal allergies     Objective:   Physical Exam  Constitutional: She is oriented to person, place, and time. She appears well-developed and well-nourished.  HENT:  Head: Normocephalic and atraumatic.  Right Ear: External ear normal.  Left Ear: External ear normal.  Mouth/Throat: Oropharynx is clear and moist.  Eyes: Conjunctivae are normal. Pupils are equal, round, and reactive to light.  Neck: Normal range of motion. Neck supple. No thyromegaly present.  Cardiovascular: Normal rate, regular rhythm, normal heart sounds and intact distal pulses.   No murmur heard. Pulmonary/Chest: Effort normal and breath sounds normal. No respiratory distress. She has no wheezes.   Abdominal: Soft. Bowel sounds are normal. There is no tenderness.  Musculoskeletal: Normal range of motion. She exhibits no edema or tenderness.  Lymphadenopathy:    She has no cervical adenopathy.  Neurological: She is alert and oriented to person, place, and time.  Skin: Skin is warm and dry. No rash noted.  Psychiatric: She has a normal mood and affect. Her behavior is normal. Thought content normal.  Vitals reviewed.    Filed Vitals:   01/31/16 1450  BP: 103/72  Pulse: 96  Temp: 98.6 F (37 C)  TempSrc: Oral  Resp: 16  Height: 5' 8.5" (1.74 m)  Weight: 186 lb 9.6 oz (84.641 kg)  SpO2: 97%      Assessment & Plan:   DESHELIA PIVIROTTO is a 50 y.o. female Annual physical exam -anticipatory guidance as below in AVS, screening labs above. Health maintenance items as above in HPI discussed/recommended as applicable.   -Nonfasting today, did not check lab work. Prior lipidemia. Work on diet, back into exercise routine after her law exam,  then can have fasting labs done.   Episodic left neck/upper back symptoms may be due to neuronal impingement/brachial plexopathy. Minimal symptoms at present, and episodic. RTC precautions if persistent and can look into cervical imaging or other treatment options.  Depression/seasonal affective disorder. Stable with current medications. RTC precautions if changes  Meds ordered this encounter  Medications  . OVER THE COUNTER MEDICATION    Sig: 80 mg as needed.   Patient Instructions       IF you received an x-Monroe today, you will receive an invoice from [facility]. Please contact [facility] at [phone] with questions or concerns regarding your invoice.   IF you received labwork today, you will receive an invoice from [employer]. Please contact [facility] at [phone] with questions or concerns regarding your invoice.   Our billing staff will not be able to assist you with questions  regarding bills from these companies.  You will be contacted with the lab results as soon as they are available. The fastest way to get your results is to activate your My Chart account. Instructions are located on the last page of this paperwork. If you have not heard from [place] regarding the results in 2 weeks, please contact this office.    No change in meds for now. Can recheck lipids once you are back into your normal routine. If more recent labs needed for your paperwork - let me know and I can place a "lab only order" for you to return at your convenience.  Your neck pain may be due to a pinched nerve or nerve pain from the "brachial plexus".  No changes in treatment at this time, but you can follow up at your convenience to examine this further if needed.  Good luck on the exam!    Keeping You Healthy  Get These Tests  Blood Pressure- Have your blood pressure checked by your healthcare provider at least once a year.  Normal blood pressure is 120/80.  Weight- Have your body mass index (BMI) calculated to screen for obesity.  BMI is a measure of body fat based on height and weight.  You can calculate your own BMI at [employer]  Cholesterol- Have your cholesterol checked every year.  Diabetes- Have your blood sugar checked every year if you have high blood pressure, high cholesterol, a family history of diabetes or if you are overweight.  Pap Test - Have a pap test every 1 to 5 years if you have been sexually active.  If you are older than 65 and recent pap tests have been normal you may not need additional pap tests.  In addition, if you have had a hysterectomy  for benign disease additional pap tests are not necessary.  Mammogram-Yearly mammograms are essential for early detection of breast cancer  Screening for Colon Cancer- Colonoscopy starting at age 33. Screening may begin sooner depending on your family history and other health conditions.  Follow up colonoscopy as  directed by your Gastroenterologist.  Screening for Osteoporosis- Screening begins at age 26 with bone density scanning,  sooner if you are at higher risk for developing Osteoporosis.  Get these medicines  Calcium with Vitamin D- Your body requires 1200-1500 mg of Calcium a day and [phone] IU of Vitamin D a day.  You can only absorb 500 mg of Calcium at a time therefore Calcium must be taken in 2 or 3 separate doses throughout the day.  Hormones- Hormone therapy has been associated with increased risk for certain cancers and heart disease.  Talk to your healthcare provider about if you need relief from menopausal symptoms.  Aspirin- Ask your healthcare provider about taking Aspirin to prevent Heart Disease and Stroke.  Get these Immuniztions  Flu shot- Every fall  Pneumonia shot- Once after the age of 75; if you are younger ask your healthcare provider if you need a pneumonia shot.  Tetanus- Every ten years.  Zostavax- Once after the age of 35 to prevent shingles.  Take these steps  Don't smoke- Your healthcare provider can help you quit. For tips on how to quit, ask your healthcare provider or go to www.smokefree.gov or call 1-800 QUIT-NOW.  Be physically active- Exercise 5 days a week for a minimum of 30 minutes.  If you are not already physically active, start slow and gradually work up to 30 minutes of moderate physical activity.  Try walking, dancing, bike riding, swimming, etc.  Eat a healthy diet- Eat a variety of healthy foods such as fruits, vegetables, whole grains, low fat milk, low fat cheeses, yogurt, lean meats, chicken, fish, eggs, dried beans, tofu, etc.  For more information go to www.thenutritionsource.org  Dental visit- Brush and floss teeth twice daily; visit your dentist twice a year.  Eye exam- Visit your Optometrist or Ophthalmologist yearly.  Drink alcohol in moderation- Limit alcohol intake to one drink or less a day.  Never drink and drive.  Depression-  Your emotional health is as important as your physical health.  If you're feeling down or losing interest in things you normally enjoy, please talk to your healthcare provider.  Seat Belts- can save your life; always wear one  Smoke/Carbon Monoxide detectors- These detectors need to be installed on the appropriate level of your home.  Replace batteries at least once a year.  Violence- If anyone is threatening or hurting you, please tell your healthcare provider.  Living Will/ Health care power of attorney- Discuss with your healthcare provider and family.     I personally performed the services described in this documentation, which was scribed in my presence. The recorded information has been reviewed and considered, and addended by me as needed.   Signed,   Caitlin Ray, MD [facility].  02/02/2016 10:13 PM

## 2016-03-25 HISTORY — PX: BREAST BIOPSY: SHX20

## 2016-04-23 ENCOUNTER — Other Ambulatory Visit: Payer: Self-pay | Admitting: Radiology

## 2016-04-25 ENCOUNTER — Encounter: Payer: Self-pay | Admitting: *Deleted

## 2016-04-25 ENCOUNTER — Telehealth: Payer: Self-pay | Admitting: *Deleted

## 2016-04-25 DIAGNOSIS — C50411 Malignant neoplasm of upper-outer quadrant of right female breast: Secondary | ICD-10-CM

## 2016-04-25 HISTORY — DX: Malignant neoplasm of upper-outer quadrant of right female breast: C50.411

## 2016-04-25 NOTE — Telephone Encounter (Signed)
Confirmed [facility] for 04/30/16 at 0815 .  Instructions and contact information given.

## 2016-04-30 ENCOUNTER — Ambulatory Visit
Admission: RE | Admit: 2016-04-30 | Discharge: 2016-04-30 | Disposition: A | Discharge status: Home / Self Care | Payer: PRIVATE HEALTH INSURANCE | Source: Ambulatory Visit | Attending: General Surgery | Admitting: General Surgery

## 2016-04-30 ENCOUNTER — Ambulatory Visit: Payer: PRIVATE HEALTH INSURANCE | Attending: General Surgery | Admitting: Physical Therapy

## 2016-04-30 ENCOUNTER — Other Ambulatory Visit: Payer: Self-pay | Admitting: *Deleted

## 2016-04-30 ENCOUNTER — Encounter: Payer: Self-pay | Admitting: Physical Therapy

## 2016-04-30 ENCOUNTER — Encounter: Payer: Self-pay | Admitting: Hematology

## 2016-04-30 ENCOUNTER — Encounter: Payer: Self-pay | Admitting: *Deleted

## 2016-04-30 ENCOUNTER — Ambulatory Visit (HOSPITAL_BASED_OUTPATIENT_CLINIC_OR_DEPARTMENT_OTHER): Payer: PRIVATE HEALTH INSURANCE | Admitting: Hematology

## 2016-04-30 ENCOUNTER — Ambulatory Visit
Admission: RE | Admit: 2016-04-30 | Discharge: 2016-04-30 | Disposition: A | Discharge status: Home / Self Care | Payer: PRIVATE HEALTH INSURANCE | Source: Ambulatory Visit | Attending: Radiation Oncology | Admitting: Radiation Oncology

## 2016-04-30 ENCOUNTER — Other Ambulatory Visit (HOSPITAL_BASED_OUTPATIENT_CLINIC_OR_DEPARTMENT_OTHER): Payer: PRIVATE HEALTH INSURANCE

## 2016-04-30 VITALS — BP 158/103 | HR 99 | Temp 98.2°F | Resp 20 | Ht 68.5 in | Wt 186.7 lb

## 2016-04-30 DIAGNOSIS — R293 Abnormal posture: Secondary | ICD-10-CM | POA: Insufficient documentation

## 2016-04-30 DIAGNOSIS — C50411 Malignant neoplasm of upper-outer quadrant of right female breast: Secondary | ICD-10-CM

## 2016-04-30 DIAGNOSIS — Z170 Estrogen receptor positive status [ER+]: Secondary | ICD-10-CM

## 2016-04-30 LAB — COMPREHENSIVE METABOLIC PANEL WITH GFR
ALT: 18 U/L (ref 0–55)
AST: 18 U/L (ref 5–34)
Albumin: 4.1 g/dL (ref 3.5–5.0)
Alkaline Phosphatase: 48 U/L (ref 40–150)
Anion Gap: 10 meq/L (ref 3–11)
BUN: 11.8 mg/dL (ref 7.0–26.0)
CO2: 25 meq/L (ref 22–29)
Calcium: 9.5 mg/dL (ref 8.4–10.4)
Chloride: 105 meq/L (ref 98–109)
Creatinine: 0.8 mg/dL (ref 0.6–1.1)
EGFR: 87 ml/min/1.73 m2 — ABNORMAL LOW
Glucose: 95 mg/dL (ref 70–140)
Potassium: 4.6 meq/L (ref 3.5–5.1)
Sodium: 141 meq/L (ref 136–145)
Total Bilirubin: 0.56 mg/dL (ref 0.20–1.20)
Total Protein: 7.4 g/dL (ref 6.4–8.3)

## 2016-04-30 LAB — CBC WITH DIFFERENTIAL/PLATELET
BASO%: 0.2 % (ref 0.0–2.0)
Basophils Absolute: 0 10e3/uL (ref 0.0–0.1)
EOS%: 1 % (ref 0.0–7.0)
Eosinophils Absolute: 0.1 10e3/uL (ref 0.0–0.5)
HCT: 41.6 % (ref 34.8–46.6)
HGB: 14.3 g/dL (ref 11.6–15.9)
LYMPH%: 12.6 % — ABNORMAL LOW (ref 14.0–49.7)
MCH: 32.3 pg (ref 25.1–34.0)
MCHC: 34.3 g/dL (ref 31.5–36.0)
MCV: 94.1 fL (ref 79.5–101.0)
MONO#: 0.5 10e3/uL (ref 0.1–0.9)
MONO%: 4.6 % (ref 0.0–14.0)
NEUT#: 8.8 10e3/uL — ABNORMAL HIGH (ref 1.5–6.5)
NEUT%: 81.6 % — ABNORMAL HIGH (ref 38.4–76.8)
Platelets: 332 10e3/uL (ref 145–400)
RBC: 4.43 10e6/uL (ref 3.70–5.45)
RDW: 12.9 % (ref 11.2–14.5)
WBC: 10.8 10e3/uL — ABNORMAL HIGH (ref 3.9–10.3)
lymph#: 1.4 10e3/uL (ref 0.9–3.3)

## 2016-04-30 MED ORDER — GADOBENATE DIMEGLUMINE 529 MG/ML IV SOLN
17.0000 mL | Freq: Once | INTRAVENOUS | Status: AC | PRN
  Administered 2016-04-30: 17 mL via INTRAVENOUS

## 2016-04-30 NOTE — Progress Notes (Signed)
Radiation Oncology         [phone] ________________________________  Initial Outpatient Consultation  Name: Caitlin Monroe MRN: [id number]  Date: 04/30/2016  DOB: 12-16-1965  LN:[id number],HERDEYC R, MD  Excell Seltzer, MD   REFERRING PHYSICIAN: Excell Seltzer, MD  DIAGNOSIS: The encounter diagnosis was Breast cancer of upper-outer quadrant of right female breast ([place]).   Multifocal clinical stage I (T1c, T1b, N0) invasive lobular carcinoma of the right breast (ER/PR +, HER2 -)  HISTORY OF PRESENT ILLNESS::Caitlin Monroe is a 50 y.o. female who had a screening mammogram on 11/29/15 noting an increase in size of a right breast nodule. Additional imaging was recommended.  Diagnostic mammogram of the right breast on 12/11/15 showed a round mass with a circumscribed margin at the 12:00 middle depth. Ultrasound of the right breast showed a benign 2.2 cm oval simple cyst in the right breast at the 12:00 middle depth. Of note, the breast composition is category D (the breast tissue is extremely dense).  At a later time, the patient self palpated a right breast mass at the 9:00 position. Another right diagnostic mammogram was performed on 04/17/16 and this noted a 2.4 cm cyst in the 12:00 middle depth that was indeterminate. However, nothing was seen to account for the patient's new symptom at the 9:00 position and a ultrasound was recommended. Ultrasound noted a new 1.6 cm lobulated mass with a circumscribed margin at the 9:00 position, a new 0.6 cm mass at the 10:00 position, and a benign 2.2 cm round simple cyst in the right breast 12:00 anterior depth. On ultrasound, the right axilla was negative for lymphadenopathy.  Biopsies on the right breast were performed on 04/23/16. Biopsy of the right breast 10:00 position revealed grade 1-2 invasive and in situ carcinoma (ER 90% positive, PR 80% positive, HER2 negative, Ki67 10%) and biopsy of the right breast 9:30 position revealed grade 1-2  invasive and in situ mammary carcinoma (ER 90% poisitive, PR 90% positive, HER2 negative, Ki67 15%). Post clip imagining showed that the clips are 6.7 cm apart. Of note, both biopsies came back as invasive lobular carcinoma.  The patient presents today in multidisciplinary breast clinic to discuss treatment options for the management of her disease.  PREVIOUS RADIATION THERAPY: No  PAST MEDICAL HISTORY:  has a past medical history of Allergy; Anxiety; Breast cancer of upper-outer quadrant of right female breast ([place]) (04/25/2016); and Depression.    PAST SURGICAL HISTORY: Past Surgical History:  Procedure Laterality Date  . ABDOMINAL HYSTERECTOMY  Aug 2011   [employer] TAH for fibroids    FAMILY HISTORY: family history includes Allergies in her daughter; Anxiety disorder in her daughter; Cancer in her brother, father, maternal grandfather, maternal grandmother, paternal grandfather, and paternal grandmother; Depression in her mother; Heart disease in her maternal grandfather and mother; Hyperlipidemia in her mother; Hypertension in her mother and sister; Melanoma in her brother; Mental illness in her maternal grandfather, maternal grandmother, and sister.  SOCIAL HISTORY:  reports that she quit smoking about 20 years ago. Her smoking use included Cigarettes. She has a 7.50 pack-year smoking history. She has never used smokeless tobacco. She reports that she does not drink alcohol or use drugs.  ALLERGIES: Review of patient's allergies indicates no known allergies.  MEDICATIONS:  Current Outpatient Prescriptions  Medication Sig Dispense Refill  . buPROPion (WELLBUTRIN SR) 100 MG 12 hr tablet Take 1 tablet (100 mg total) by mouth daily. 90 tablet 3  . cholecalciferol (VITAMIN D) 1000  UNITS tablet Take 1,000 Units by mouth daily.    . Omega-3 Fatty Acids (FISH OIL PO) Take by mouth.    . sertraline (ZOLOFT) 100 MG tablet Take 150 mg by mouth daily.     No current facility-administered  medications for this encounter.     REVIEW OF SYSTEMS:  A 15 point review of systems is documented in the electronic medical record. This was obtained by the nursing staff. However, I reviewed this with the patient to discuss relevant findings and make appropriate changes.  Pertinent items noted in HPI and remainder of comprehensive ROS otherwise negative.   Gynecologic History  Age at first menstrual period? 12  Are you still having periods? No (hysterectomy)  If you no longer have periods: Have you used hormone replacement? No Obstetric History:  How many children have you carried to term? q Your age at first live birth? 43  Pregnant now or trying to get pregnant? No  Have you used birth control pills or hormone shots for contraception? Yes  If so, for how long (or approximate dates)? Ages 42s-30s  Would you be interested in learning more about the options to preserve fertility? No Health Maintenance:  Have you ever had a colonoscopy? No  Have you ever had a bone density? No  Date of your last PAP smear? Pre-2012 Date of your FIRST mammogram? Under age 64  The patient complains of urinary tract infections, right breast lumps, anxiety, and depression.   PHYSICAL EXAM:  Vitals with BMI 04/30/2016  Height 5' 8.5"  Weight 186 lbs 11 oz  BMI 28  Systolic 563  Diastolic 149  Pulse 99  Respirations 20   General: Alert and oriented, in no acute distress. Her sister and a good friend accompanied her during the exam. Neck: Neck is supple, no palpable cervical or supraclavicular lymphadenopathy. Heart: Regular in rate and rhythm with no murmurs, rubs, or gallops. Chest: Clear to auscultation bilaterally, with no rhonchi, wheezes, or rales. Extremities: No cyanosis or edema. Lymphatics: see Neck Exam Skin: No concerning lesions. Psychiatric: Judgment and insight are intact. Affect is appropriate. Breast: Left breast, no palpable mass or nipple discharge, breast is large and pendulous.  Right breast has bruising in the lateral and upper outer quadrant from her biopsy. In the approximately 10:00 position of the left breast, there is a 1.5 cm firm mass palpated several cm from the areolar border. No nipple discharge or bleeding.  ECOG = 0   LABORATORY DATA:  Lab Results  Component Value Date   WBC 10.8 (H) 04/30/2016   HGB 14.3 04/30/2016   HCT 41.6 04/30/2016   MCV 94.1 04/30/2016   PLT 332 04/30/2016   NEUTROABS 8.8 (H) 04/30/2016   Lab Results  Component Value Date   NA 141 04/30/2016   K 4.6 04/30/2016   CL 104 06/27/2015   CO2 25 04/30/2016   GLUCOSE 95 04/30/2016   CREATININE 0.8 04/30/2016   CALCIUM 9.5 04/30/2016      RADIOGRAPHY: No results found.    IMPRESSION: Multifocal clinical stage I (T1c, T1b, N0) invasive lobular carcinoma of the right breast (ER/PR +, HER2 -)  I spoke to the patient today regarding her diagnosis and options for treatment. We discussed the role of radiation in decreasing local failures in patients who undergo a lumpectomy. We discussed the process of CT simulation and the placement tattoos. We discussed 4-6 weeks of treatment as an outpatient. We discussed the possibility of asymptomatic lung damage. We discussed  the low likelihood of secondary malignancies. We discussed the possible side effects including but not limited to skin redness, fatigue, permanent skin darkening, and breast swelling.  The patient appears to be a good candidate for breast conservation therapy with radiation directed to the right breast. However, we will await the results of MRI and genetic testing prior to making surgical treatment decisions.  PLAN: Breast MRI and genetic evaluation.     ------------------------------------------------  Blair Promise, PhD, MD  This document serves as a record of services personally performed by Gery Pray, MD. It was created on his behalf by Darcus Austin, a trained medical scribe. The creation of this record is  based on the scribe's personal observations and the provider's statements to them. This document has been checked and approved by the attending provider.

## 2016-04-30 NOTE — Therapy (Signed)
[place], [place], [address] Phone: [phone]   Fax:  [phone]  Physical Therapy Evaluation  Patient Details  Name: Caitlin Monroe MRN: [id number] Date of Birth: 1966/07/03 Referring Provider: Dr. Excell Seltzer  Encounter Date: 04/30/2016      PT End of Session - 04/30/16 1222    Visit Number 1   Number of Visits 1   PT Start Time 1104   PT Stop Time 1130   PT Time Calculation (min) 26 min   Activity Tolerance Patient tolerated treatment well   Behavior During Therapy [facility] for tasks assessed/performed      Past Medical History:  Diagnosis Date  . Allergy   . Anxiety   . Breast cancer of upper-outer quadrant of right female breast ([place]) 04/25/2016  . Depression     Past Surgical History:  Procedure Laterality Date  . ABDOMINAL HYSTERECTOMY  Aug 2011   [employer] Vinc[employer] TAH for fibroids    There were no vitals filed for this visit.       Subjective Assessment - 04/30/16 1223    Subjective Patient reports she is here today to be assessed by her medical team for her newly diagnosed right breast cancer.   Pertinent History Patient was diagnosed on 04/17/16 with left grade 1-2 invasive lobular carcinoma breast cancer.  There are 2 masses measuring 6 mm at 10:00 and 1.6 cm at 9:00. It is ER/PR positive and HER2 negative with a Ki67 of 10% and is located in the upper outer quadrant.   Patient Stated Goals Reduce lymphedema risk and learn post op shoulder HEP            [facility] PT Assessment - 04/30/16 0001      Assessment   Medical Diagnosis Right breast cancer   Referring Provider Dr. Excell Seltzer   Onset Date/Surgical Date 04/17/16   Hand Dominance Right   Prior Therapy none     Precautions   Precautions Other (comment)   Precaution Comments Active breast cancer     Restrictions   Weight Bearing Restrictions No     Balance Screen   Has the patient fallen in the past 6  months No   Has the patient had a decrease in activity level because of a fear of falling?  No   Is the patient reluctant to leave their home because of a fear of falling?  No     Home Environment   Living Environment Private residence   Living Arrangements Alone  10 & 65 y.o. kids live with their Dad most of the time   Available Help at Discharge Friend(s)     Prior Function   Level of Independence Independent   Vocation Unemployed   Vocation Requirements [employer] school 5/17 but is not working   Leisure Does yoga 3x/wk for 30 minutes and swims sometimes     Cognition   Overall Cognitive Status Within Functional Limits for tasks assessed     Posture/Postural Control   Posture/Postural Control Postural limitations   Postural Limitations Rounded Shoulders;Forward head     ROM / Strength   AROM / PROM / Strength AROM;Strength     AROM   AROM Assessment Site Shoulder;Cervical   Right/Left Shoulder Right;Left   Right Shoulder Extension 57 Degrees   Right Shoulder Flexion 160 Degrees   Right Shoulder ABduction 172 Degrees   Right Shoulder Internal Rotation 77 Degrees   Right Shoulder External Rotation 75 Degrees  Left Shoulder Extension 48 Degrees   Left Shoulder Flexion 166 Degrees   Left Shoulder ABduction 171 Degrees   Left Shoulder Internal Rotation 80 Degrees   Left Shoulder External Rotation 85 Degrees   Cervical Flexion WNL   Cervical Extension WNL   Cervical - Right Side Bend WNL   Cervical - Left Side Bend WNL   Cervical - Right Rotation WNL   Cervical - Left Rotation WNL     Strength   Overall Strength Within functional limits for tasks performed           LYMPHEDEMA/ONCOLOGY QUESTIONNAIRE - 04/30/16 1216      Type   Cancer Type Rihgt breast cancer     Lymphedema Assessments   Lymphedema Assessments Upper extremities     Right Upper Extremity Lymphedema   10 cm Proximal to Olecranon Process 29.1 cm   Olecranon Process 25.4 cm   10 cm Proximal  to Ulnar Styloid Process 21.9 cm   Just Proximal to Ulnar Styloid Process 14 cm   Across Hand at [employer] 18.2 cm   At [place] of 2nd Digit 5.9 cm     Left Upper Extremity Lymphedema   10 cm Proximal to Olecranon Process 29.5 cm   Olecranon Process 26.3 cm   10 cm Proximal to Ulnar Styloid Process 21.3 cm   Just Proximal to Ulnar Styloid Process 13.8 cm   Across Hand at [employer] 18.2 cm   At [place] of 2nd Digit 5.8 cm             Patient was instructed today in a home exercise program today for post op shoulder range of motion. These included active assist shoulder flexion in sitting, scapular retraction, wall walking with shoulder abduction, and hands behind head external rotation.  She was encouraged to do these twice a day, holding 3 seconds and repeating 5 times when permitted by her physician.               PT Education - 04/30/16 1221    Education provided Yes   Education Details Lymphedema risk reduction and post op shoulder ROM HEP   Person(s) Educated Patient   Methods Explanation;Demonstration;Handout   Comprehension Returned demonstration;Verbalized understanding              Breast Clinic Goals - 04/30/16 1225      Patient will be able to verbalize understanding of pertinent lymphedema risk reduction practices relevant to her diagnosis specifically related to skin care.   Time 1   Period Days   Status Achieved     Patient will be able to return demonstrate and/or verbalize understanding of the post-op home exercise program related to regaining shoulder range of motion.   Time 1   Period Days   Status Achieved     Patient will be able to verbalize understanding of the importance of attending the postoperative After Breast Cancer Class for further lymphedema risk reduction education and therapeutic exercise.   Time 1   Period Days   Status Achieved              Plan - 04/30/16 1223    Rehab Potential Excellent   Clinical  Impairments Affecting Rehab Potential None   PT Frequency One time visit   PT Treatment/Interventions Patient/family education;Therapeutic exercise   PT Next Visit Plan Will f/u after surgery   PT Home Exercise Plan post op shoulder ROM HEP   Consulted and Agree with Plan of Care  Patient;Family member/caregiver   Family Member Consulted sister and friend      Patient will benefit from skilled therapeutic intervention in order to improve the following deficits and impairments:  Decreased strength, Decreased knowledge of precautions, Pain, Impaired UE functional use, Decreased range of motion  Visit Diagnosis: Carcinoma of upper-outer quadrant of right female breast ([place]) - Plan: PT plan of care cert/re-cert  Abnormal posture - Plan: PT plan of care cert/re-cert   Patient will follow up at outpatient cancer rehab if needed following surgery.  If the patient requires physical therapy at that time, a specific plan will be dictated and sent to the referring physician for approval. The patient was educated today on appropriate basic range of motion exercises to begin post operatively and the importance of attending the After Breast Cancer class following surgery.  Patient was educated today on lymphedema risk reduction practices as it pertains to recommendations that will benefit the patient immediately following surgery.  She verbalized good understanding.  No additional physical therapy is indicated at this time.      Problem List Patient Active Problem List   Diagnosis Date Noted  . Breast cancer of upper-outer quadrant of right female breast ([place]) 04/25/2016  . Depression 06/27/2015  . Seasonal affective disorder ([place]) 06/27/2015  . Allergic rhinitis 04/05/2013  . Depression with anxiety 04/05/2013   Annia Friendly, PT 04/30/16 12:27 PM  [facility] [place], [place], [address] Phone: [phone]   Fax:   [phone]  Name: Caitlin Monroe MRN: [id number] Date of Birth: 1966-06-18

## 2016-04-30 NOTE — Progress Notes (Addendum)
Caitlin Monroe  [phone] Fax:([phone] [place] Note   Patient Care Team: Caitlin Agreste, MD as PCP - General (Family Medicine) Brien Few, MD as Consulting Physician (Obstetrics and Gynecology) Artelia Laroche, CNM as Midwife (Obstetrics and Gynecology) Excell Seltzer, MD as Consulting Physician (General Surgery) [name], MD as Consulting Physician (Hematology) Gery Pray, MD as Consulting Physician (Radiation Oncology) 04/30/2016  CHIEF COMPLAINTS/PURPOSE OF CONSULTATION:  Newly diagnosed right breast cancer  Oncology History   Breast cancer of upper-outer quadrant of right female breast [facility])   Staging form: Breast, AJCC 7th Edition   - Clinical stage from 04/23/2016: Stage IA (T1c(m), N0, M0) - Signed by [name], MD on 04/30/2016      Breast cancer of upper-outer quadrant of right female breast ([place])   04/17/2016 Mammogram    Mammogram and US showed a 2.2cm cyst at 12:00, a 1.6cm lobulated mass at 9:00 and a 8m mass at 10:00, axilla was negative       04/23/2016 Initial Diagnosis    Breast cancer of upper-outer quadrant of right female breast ([place]      04/23/2016 Initial Biopsy    Core needle biopsy of left breast masses at 10:00 and 9:30 positions both showed invasive and insitu mammary carcinoma       04/23/2016 Receptors her2    ER 90%+, PR 80-90%+, HER2-, KI67 10-15%        HISTORY OF PRESENTING ILLNESS:  AMax Fickle89y.o. female is here because of her recently diagnosed right breast cancer. She is accompanied by her friend and sister to our multidisciplinary breast clinic today.  She failed a breast lump about a few weeks ago. She denies any breast pain, skin change or nipple discharge. She had normal screening mammogram in April 2017. She was seen by her primary care physician for the breast mass, and underwent diagnostic mammogram and ultrasound on 04/17/2016, which showed a 2.2 cm cyst at 12:00  position, a 1.6 cm lobulated mass at 9:00 and a 6 mm mass at 10:00 position, ultrasound of the axilla was negative. She underwent ultrasound guided core needle biopsy of both right breast mass, which all showed invasive lobular carcinoma, ER/PR strongly positive, HER-2 negative.  She feels very well overall, denies any pain or other symptoms. She is physically active, has been quite anxious since her cancer diagnosis.  GYN HISTORY  Menarchal: 12 LMP: 89(hysrectomy)  Contraceptive: 10, stopped in 30's  HRT: none  G1P1: one daughter, 139yo    MEDICAL HISTORY:  Past Medical History:  Diagnosis Date  . Allergy   . Anxiety   . [place] of [place] of [place] 04[place]  . Depression     SURGICAL HISTORY: Past Surgical History:  Procedure Laterality Date  . ABDOMINAL HYSTERECTOMY  Aug 2011   Da Vinci TAH for fibroids    SOCIAL HISTORY: Social History   Social History  . Marital status: Married    Spouse name: N/A  . Number of children: N/A  . Years of education: N/A   Occupational History  . Law Student    Social History Main Topics  . Smoking status: Former Smoker    Packs/day: 0.50    Years: 15.00    Types: Cigarettes    Quit date: 06/23/1995  . Smokeless tobacco: Never Used  . Alcohol use No  . Drug use: No  . Sexual activity: Yes   Other Topics Concern  .  Not on file   Social History Narrative   Married. Education: [employer]. Exercise: Gym 2 times a week fit walking.                         FAMILY HISTORY: Family History  Problem Relation Age of Onset  . Hypertension Mother   . Depression Mother   . Hyperlipidemia Mother   . Heart disease Mother   . Cancer Father     prostate  . Mental illness Sister   . Hypertension Sister   . Cancer Brother     Prostate  . Cancer Maternal Grandmother     Lung and Breast  . Mental illness Maternal Grandmother   . Cancer Maternal Grandfather   . Heart disease Maternal  Grandfather   . Mental illness Maternal Grandfather   . Cancer Paternal Grandmother     lung  . Cancer Paternal Grandfather   . Allergies Daughter   . Anxiety disorder Daughter   . Melanoma Brother     ALLERGIES:  has No Known Allergies.  MEDICATIONS:  Current Outpatient Prescriptions  Medication Sig Dispense Refill  . buPROPion (WELLBUTRIN SR) 100 MG 12 hr tablet Take 1 tablet (100 mg total) by mouth daily. 90 tablet 3  . cholecalciferol (VITAMIN D) 1000 UNITS tablet Take 1,000 Units by mouth daily.    . Omega-3 Fatty Acids (FISH OIL PO) Take by mouth.    . sertraline (ZOLOFT) 100 MG tablet Take 150 mg by mouth daily.     No current facility-administered medications for this visit.     REVIEW OF SYSTEMS:   Constitutional: Denies fevers, chills or abnormal night sweats Eyes: Denies blurriness of vision, double vision or watery eyes Ears, nose, mouth, throat, and face: Denies mucositis or sore throat Respiratory: Denies cough, dyspnea or wheezes Cardiovascular: Denies palpitation, chest discomfort or lower extremity swelling Gastrointestinal:  Denies nausea, heartburn or change in bowel habits Skin: Denies abnormal skin rashes Lymphatics: Denies new lymphadenopathy or easy bruising Neurological:Denies numbness, tingling or new weaknesses Behavioral/Psych: Mood is stable, no new changes  All other systems were reviewed with the patient and are negative.  PHYSICAL EXAMINATION: ECOG PERFORMANCE STATUS: 0 - Asymptomatic  Vitals:   04/30/16 0923  BP: (!) 158/103  Pulse: 99  Resp: 20  Temp: 98.2 F (36.8 C)   Filed Weights   04/30/16 0923  Weight: 186 lb 11.2 oz (84.7 kg)    GENERAL:alert, no distress and comfortable SKIN: skin color, texture, turgor are normal, no rashes or significant lesions EYES: normal, conjunctiva are pink and non-injected, sclera clear OROPHARYNX:no exudate, no erythema and lips, buccal mucosa, and tongue normal  NECK: supple, thyroid normal  size, non-tender, without nodularity LYMPH:  no palpable lymphadenopathy in the cervical, axillary or inguinal LUNGS: clear to auscultation and percussion with normal breathing effort HEART: regular rate & rhythm and no murmurs and no lower extremity edema ABDOMEN:abdomen soft, non-tender and normal bowel sounds Musculoskeletal:no cyanosis of digits and no clubbing  PSYCH: alert & oriented x 3 with fluent speech NEURO: no focal motor/sensory deficits Breasts: Breast inspection showed them to be symmetrical with no nipple discharge. There is a 2cm mass in the lateral posterial of right breast, and a 2 cm movable round mass at 12:00 position of the right breast, (+) skin ecchymosis at the biopsy site. Palpation of the left beast and axilla revealed no obvious mass that I could appreciate.  LABORATORY DATA:  I have reviewed the  data as listed CBC Latest Ref Rng & Units 04/30/2016 06/27/2015 07/19/2014  WBC 3.9 - 10.3 10e3/uL 10.8(H) 5.4 6.7  Hemoglobin 11.6 - 15.9 g/dL 14.3 13.7 14.4  Hematocrit 34.8 - 46.6 % 41.6 40.9 42.8  Platelets 145 - 400 10e3/uL 332 339 -   CMP Latest Ref Rng & Units 04/30/2016 06/27/2015 07/19/2014  Glucose 70 - 140 mg/dl 95 98 84  BUN 7.0 - 26.0 mg/dL 11._0 Creatinine 0.6 - 1.1 mg/dL 0.8 0.85 0.77  Sodium 136 - 145 mEq/L 141 139 138  Potassium 3.5 - 5.1 mEq/L 4.6 4.4 4.6  Chloride 98 - 110 mmol/L - 104 104  CO2 22 - 29 mEq/L _1 Calcium 8.4 - 10.4 mg/dL 9.5 9.1 9.4  Total Protein 6.4 - 8.3 g/dL 7.4 7.0 7.2  Total Bilirubin 0.20 - 1.20 mg/dL 0.56 0.7 0.6  Alkaline Phos 40 - 150 U/L 48 39 41  AST 5 - 34 U/L _2 ALT 0 - 55 U/L _3 PATHOLOGY REPORT  Diagnosis 04/23/2016 1. Breast, right, needle core biopsy, 10 o'clock - INVASIVE AND IN SITU MAMMARY CARCINOMA. 2. Breast, right, needle core biopsy, 9:30 o'clock - INVASIVE AND IN SITU MAMMARY CARCINOMA.  Microscopic Comment 1. , 2. E-Cadherin and breast prognostic profile will be  performed.  1. Results: IMMUNOHISTOCHEMICAL AND MORPHOMETRIC ANALYSIS PERFORMED MANUALLY Estrogen Receptor: 90%, POSITIVE, STRONG STAINING INTENSITY Progesterone Receptor: 80%, POSITIVE, STRONG STAINING INTENSITY Proliferation Marker Ki67: 10%  Results: HER2 - NEGATIVE RATIO OF HER2/CEP17 SIGNALS 1.60 AVERAGE HER2 COPY NUMBER PER CELL 2.00  2. PROGNOSTIC INDICATORS Results: IMMUNOHISTOCHEMICAL AND MORPHOMETRIC ANALYSIS PERFORMED MANUALLY Estrogen Receptor: 90%, POSITIVE, STRONG STAINING INTENSITY Progesterone Receptor: 90%, POSITIVE, STRONG STAINING INTENSITY Proliferation Marker Ki67: 15%  Results: HER2 - NEGATIVE RATIO OF HER2/CEP17 SIGNALS 1.36 AVERAGE HER2 COPY NUMBER PER CELL 1.70   RADIOGRAPHIC STUDIES: I have personally reviewed the radiological images as listed and agreed with the findings in the report. No results found.  ASSESSMENT & PLAN:  50 year old Caucasian female, no significant past medical history, presented with a palpable right breast mass  1. Breast cancer of upper outer quadrant of right breast, invasive lobular carcinoma, mcT1cN0M0, stage IA, ER+/PR+/HER2-, G1 --We discussed her imaging findings and the biopsy results in great details. -Doing this lobular histology, and a multicenter disease, we recommend her to have a bilateral breast MRI for further evaluation. - She was seen by Dr. Excell Seltzer today, mastectomy versus lumpectomy was discussed with patient. Patient wants to wait for her genetic testing results, before she decides about her surgery.  -I recommend a Oncotype Dx test on the surgical sample and we'll make a decision about adjuvant chemotherapy based on the Oncotype result. Written material of this test was given to her. She is young and fit, would be a good candidate for chemotherapy if her Oncotype recurrence score is high. Given her low-grade, lobular histology, low Ki-67, this is likely a low risk disease. -If her surgical sentinel lymph  node node positive, I recommend mammaprint for further risk stratification and guide adjuvant chemotherapy. -Giving the strong ER and PR expression in her tumor, I recommend adjuvant endocrine therapy with tamoxifen if she is pre-or perimenopausal, or aromatase inhibitor if she is postmenopausal for a total of 10 years to reduce the risk of cancer recurrence. Potential benefits and side effects were discussed with patient and she is interested. I plan to check her [place] and estradiol level after surgery, to determine  if she is truly postmenopausal.  -She was also seen by radiation oncologist Dr. Sondra Come today. If her undergo lumpectomy, she will likely need adjuvant breast radiation -We also discussed the breast cancer surveillance after her surgery. She will continue annual screening mammogram, self exam, and a routine office visit with lab and exam with [place]. -I encouraged her to have healthy diet and exercise regularly.   Plan -Bilateral Breast MRI with and without contrast -Genetic counseling and testing as soon as possible -She will make a decision about her breast surgery based on her genetic testing results -Oncotype on surgical sample if node negative, or mammaprint if node positive -I'll see her 3 weeks after her surgery to review her Oncotype results, or given her a call if it's low risk.   No orders of the defined types were placed in this encounter.   All questions were answered. The patient knows to call the clinic with any problems, questions or concerns. I spent 55 minutes counseling the patient face to face. The total time spent in the appointment was 60 minutes and more than 50% was on counseling.     [name], MD 04/30/2016 4:21 PM

## 2016-04-30 NOTE — Progress Notes (Signed)
Clinical Social Work [place] Psychosocial Distress Screening [place]  Patient completed distress screening protocol and scored a 10 on the Psychosocial Distress Thermometer which indicates severe distress. Clinical Social Worker met with patient and patients family/friend in [facility] to assess for distress and other psychosocial needs. Patient stated she was feeling overwhelmed but felt "better" after meeting with the treatment team and getting more information on her treatment plan. CSW and patient discussed common feeling and emotions when being diagnosed with cancer, and the importance of support during treatment. CSW informed patient of the support team and support services at [facility], and patient was agreeable to an alight guide referral. CSW provided contact information and encouraged patient to call with any questions or concerns.  ONCBCN DISTRESS SCREENING 04/30/2016  Screening Type Initial Screening  Distress experienced in past week (1-10) 10  Practical problem type Work/school  Family Problem type Partner;Children  Emotional problem type Nervousness/Anxiety;Adjusting to illness  Spiritual/Religous concerns type Relating to God;Facing my mortality  Information Concerns Type Lack of info about treatment;Lack of info about diagnosis;Lack of info about complementary therapy choices  Physician notified of physical symptoms Yes  Referral to clinical psychology No  Referral to clinical social work Yes  Referral to dietition No  Referral to financial advocate No  Referral to support programs Yes  Referral to palliative care No     Johnnye Lana, MSW, LCSW, OSW-C Clinical Social Worker [place] [phone]

## 2016-04-30 NOTE — Patient Instructions (Signed)

## 2016-05-01 ENCOUNTER — Ambulatory Visit (HOSPITAL_BASED_OUTPATIENT_CLINIC_OR_DEPARTMENT_OTHER): Payer: PRIVATE HEALTH INSURANCE | Admitting: Genetic Counselor

## 2016-05-01 ENCOUNTER — Other Ambulatory Visit: Payer: PRIVATE HEALTH INSURANCE

## 2016-05-01 ENCOUNTER — Encounter: Payer: Self-pay | Admitting: Genetic Counselor

## 2016-05-01 DIAGNOSIS — C50411 Malignant neoplasm of upper-outer quadrant of right female breast: Secondary | ICD-10-CM

## 2016-05-01 DIAGNOSIS — Z8042 Family history of malignant neoplasm of prostate: Secondary | ICD-10-CM | POA: Diagnosis not present

## 2016-05-01 DIAGNOSIS — Z803 Family history of malignant neoplasm of breast: Secondary | ICD-10-CM

## 2016-05-01 NOTE — Progress Notes (Signed)
REFERRING PROVIDER: [name], MD [place], [place] [address]   [name], MD  PRIMARY PROVIDER:  [name], MD  PRIMARY REASON FOR VISIT:  1. Breast cancer of upper-outer quadrant of right female breast ([place])   2. Family history of breast cancer   3. Family history of prostate cancer      HISTORY OF PRESENT ILLNESS:   Caitlin Monroe, a 50 y.o. female, was seen for a  cancer genetics consultation at the request of Dr. Carlota Raspberry due to a personal and family history of cancer.  Caitlin Monroe presents to clinic today to discuss the possibility of a hereditary predisposition to cancer, genetic testing, and to further clarify her future cancer risks, as well as potential cancer risks for family members.   In August 2017, at the age of 37, Caitlin Monroe was diagnosed with invasive lobular carcinoma of the right breast. The tumor is ER+/PR+/Her2-. This will be treated with lumpectomy or mastectomy, depending on her genetic testing.     CANCER HISTORY:  Oncology History   Breast cancer of upper-outer quadrant of right female breast [facility])   Staging form: Breast, AJCC 7th Edition   - Clinical stage from 04/23/2016: Stage IA (T1c(m), N0, M0) - Signed by [name], MD on 04/30/2016      Breast cancer of upper-outer quadrant of right female breast ([place])   04/17/2016 Mammogram    Mammogram and US showed a 2.2cm cyst at 12:00, a 1.6cm lobulated mass at 9:00 and a 39m mass at 10:00, axilla was negative       04/23/2016 Initial Diagnosis    Breast cancer of upper-outer quadrant of right female breast ([place]      04/23/2016 Initial Biopsy    Core needle biopsy of left breast masses at 10:00 and 9:30 positions both showed invasive and insitu mammary carcinoma       04/23/2016 Receptors her2    ER 90%+, PR 80-90%+, HER2-, KI67 10-15%        HORMONAL RISK FACTORS:  Menarche was at age 50  First live birth at age 50  OCP use for approximately 10 years.  Ovaries  intact: yes.  Hysterectomy: yes.  Menopausal status: perimenopausal.  HRT use: 0 years. Colonoscopy: no; not examined. Mammogram within the last year: yes. Number of breast biopsies: 1. Up to date with pelvic exams:  yes. Any excessive radiation exposure in the past:  no  Past Medical History:  Diagnosis Date  . Allergy   . Anxiety   . Breast cancer of upper-outer quadrant of right female breast ([place] 04/25/2016  . Depression   . Family history of breast cancer   . Family history of prostate cancer     Past Surgical History:  Procedure Laterality Date  . ABDOMINAL HYSTERECTOMY  Aug 2011   Da Vinci TAH for fibroids    Social History   Social History  . Marital status: Married    Spouse name: N/A  . Number of children: N/A  . Years of education: N/A   Occupational History  . Law Student    Social History Main Topics  . Smoking status: Former Smoker    Packs/day: 0.50    Years: 15.00    Types: Cigarettes    Quit date: 06/23/1995  . Smokeless tobacco: Never Used  . Alcohol use No  . Drug use: No  . Sexual activity: Yes   Other Topics Concern  . None   Social History Narrative  Married. Education: [employer]. Exercise: Gym 2 times a week fit walking.                          FAMILY HISTORY:  We obtained a detailed, 4-generation family history.  Significant diagnoses are listed below: Family History  Problem Relation Age of Onset  . Hypertension Mother   . Depression Mother   . Hyperlipidemia Mother   . Heart disease Mother   . Prostate cancer Father 72  . Mental illness Sister   . Hypertension Sister   . Prostate cancer Brother     dx in his late 10s  . Mental illness Maternal Grandmother   . Breast cancer Maternal Grandmother     dx in her 59s  . Lung cancer Maternal Grandmother     smoker since age 21; dx in her 52s  . Cancer Maternal Grandfather   . Heart disease Maternal Grandfather   . Mental illness Maternal Grandfather   . Lung cancer  Paternal Grandmother     dx in her 69s, life long smoker  . Prostate cancer Paternal Grandfather   . Allergies Daughter   . Anxiety disorder Daughter   . Melanoma Brother 50  . Bladder Cancer Sister 23    The patient has a biological daughter and adopted son.  She has two brothers and two sisters.  One brother was diagnosed with prostate cancer in his 38's, the other brother was diagnosed with melanoma in his 27's.  Her oldest sister was diagnosed with bladder cancer at 63 and the other sister died by suicide at 104.  Her father was diagnosed with prostate cancer in his 37's.  He has one brother who is alive at 9.  His father died from prostate cancer at 71 and his mother died of lung cancer.  The patient's mother had one brother who died from unknown causes.  Her mother had breast cancer in her 56's and lung cancer in her 35's.  She had been a smoker from age 48.  There is no other reported family history.  Patient's maternal ancestors are of [place] and [place] descent, and paternal ancestors are of [place] descent. There is reported Ashkenazi Jewish ancestry. There is no known consanguinity.  GENETIC COUNSELING ASSESSMENT: Caitlin Monroe is a 50 y.o. female with a personal history of breast cancer and family history of breast, prostate and bladder cancer and melanoma which is somewhat suggestive of a hereditary cancer syndrome and predisposition to cancer. We, therefore, discussed and recommended the following at today's visit.   DISCUSSION: We discussed that about 5-10% of breast cancer is hereditary with most cases due to BRCA mutations.  We discussed that other genes, such as PALB2, ATM and CHEK2 are moderate risk genes that can increase the risk for breast cancer.  Based on her lobular cancer, we also mentioned CDH1 mutations as an increased risk for lobular breast cancer, but there is not the history of gastric cancer.  We reviewed the characteristics, features and inheritance  patterns of hereditary cancer syndromes. We also discussed genetic testing, including the appropriate family members to test, the process of testing, insurance coverage and turn-around-time for results. We discussed the implications of a negative, positive and/or variant of uncertain significant result. We recommended Caitlin Monroe pursue genetic testing for the Breast/Ovarian cancer gene panel as well as melanoma genes including BAP1, CDK4, CDKN2A and MITF.    Based on Caitlin Monroe's personal and family history  of cancer, she meets medical criteria for genetic testing. Despite that she meets criteria, she may still have an out of pocket cost. We discussed that if her out of pocket cost for testing is over $100, the laboratory will call and confirm whether she wants to proceed with testing.  If the out of pocket cost of testing is less than $100 she will be billed by the genetic testing laboratory.   PLAN: After considering the risks, benefits, and limitations, Caitlin Monroe  provided informed consent to pursue genetic testing and the blood sample was sent to [facility] for analysis of the Custom gene panel. The results were placed on a [facility], and therefore should be available within approximately 2-3 weeks' time, at which point they will be disclosed by telephone to Caitlin Monroe, as will any additional recommendations warranted by these results. Caitlin Monroe will receive a summary of her genetic counseling visit and a copy of her results once available. This information will also be available in Epic. We encouraged Caitlin Monroe to remain in contact with cancer genetics annually so that we can continuously update the family history and inform her of any changes in cancer genetics and testing that may be of benefit for her family. Caitlin Monroe questions were answered to her satisfaction today. Our contact information was provided should additional questions or concerns arise.  Lastly, we encouraged Caitlin Monroe  to remain in contact with cancer genetics annually so that we can continuously update the family history and inform her of any changes in cancer genetics and testing that may be of benefit for this family.   Ms.  Monroe questions were answered to her satisfaction today. Our contact information was provided should additional questions or concerns arise. Thank you for the referral and allowing [place] to share in the care of your patient.   Karen P. Florene Glen, [place], [facility] Certified Genetic Counselor [email or web address] phone: [phone]  The patient was seen for a total of 60 minutes in face-to-face genetic counseling.  This patient was discussed with Drs. Magrinat, Lindi Adie and/or Burr Medico who agrees with the above.    _______________________________________________________________________ For Office Staff:  Number of people involved in session: 1 Was an Intern/ student involved with case: No

## 2016-05-05 ENCOUNTER — Ambulatory Visit (INDEPENDENT_AMBULATORY_CARE_PROVIDER_SITE_OTHER): Payer: PRIVATE HEALTH INSURANCE | Admitting: Family Medicine

## 2016-05-05 VITALS — BP 130/90 | HR 90 | Temp 98.4°F | Resp 18 | Ht 68.5 in | Wt 185.0 lb

## 2016-05-05 DIAGNOSIS — Z658 Other specified problems related to psychosocial circumstances: Secondary | ICD-10-CM

## 2016-05-05 DIAGNOSIS — R030 Elevated blood-pressure reading, without diagnosis of hypertension: Secondary | ICD-10-CM

## 2016-05-05 DIAGNOSIS — R002 Palpitations: Secondary | ICD-10-CM

## 2016-05-05 DIAGNOSIS — F439 Reaction to severe stress, unspecified: Secondary | ICD-10-CM

## 2016-05-05 DIAGNOSIS — IMO0001 Reserved for inherently not codable concepts without codable children: Secondary | ICD-10-CM

## 2016-05-05 LAB — POCT URINALYSIS DIP (MANUAL ENTRY)
BILIRUBIN UA: NEGATIVE
BILIRUBIN UA: NEGATIVE
GLUCOSE UA: NEGATIVE
Leukocytes, UA: NEGATIVE
Nitrite, UA: NEGATIVE
Protein Ur, POC: NEGATIVE
RBC UA: NEGATIVE
SPEC GRAV UA: 1.02
Urobilinogen, UA: 0.2
pH, UA: 5.5

## 2016-05-05 MED ORDER — METOPROLOL TARTRATE 25 MG PO TABS: 12.5000 mg | ORAL_TABLET | Freq: Two times a day (BID) | ORAL | 1 refills | Status: DC

## 2016-05-05 NOTE — Progress Notes (Signed)
Subjective:     Patient ID: Caitlin Monroe, female   DOB: [birth year], 50 y.o.   MRN: [id number]  HPI Caitlin Monroe is a 50 y.o. female Here for evaluation of elevated blood pressure. Last seen by me on June 8 for a physical. Blood pressure was normal at that time.  Recently diagnosed with breast cancer of upper-outer quadrant right breast, Dr. Burr Medico, radiation oncologist Dr. Sondra Come, surgeon Dr. Excell Seltzer.  Planning on genetic testing results to determine on mastectomy versus lumpectomy. Bilateral breast MRI on September 7. Few options are being discussed. Planning on meeting with Dr. Harlow Mares (plastic surgeon).  Blood pressure 158/103 at oncology visit on 9/6.  History of depression, and takes Zoloft and Wellbutrin (see last visit, usually increases her Zoloft to 200 mg daily during the winter as some seasonal component to her depression). Currently at 150mg  per day. 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 Has had palpitations at times for past 1 month or this summer. Had been drinking more coffee- 4-5 cups per day, but cut back to 2 cups per day past 4-5 days. No rebound headache.   Has psychiatrist and counselor. In recovery form alcohol so does not want to add benzo due to prior addiction. Has been exercising 30 mins cardio per day.   No prior dx of HTN, but has been borderline in the office previously. Home BP"s when feeling calm has been around 127/93, 0000000, 0000000, diastolic 89, 93.  Lab Results  Component Value Date   CREATININE 0.8 04/30/2016      Patient Active Problem List   Diagnosis Date Noted  . Family history of breast cancer   . Family history of prostate cancer   . Breast cancer of upper-outer quadrant of right female breast ([place]) 04/25/2016  . Depression 06/27/2015  . Seasonal affective disorder ([place]) 06/27/2015  . Allergic rhinitis 04/05/2013  . Depression with anxiety 04/05/2013   Past Medical History:  Diagnosis Date  . Allergy     . Anxiety   . Breast cancer of upper-outer quadrant of right female breast ([place]) 04/25/2016  . Depression   . Family history of breast cancer   . Family history of prostate cancer    Past Surgical History:  Procedure Laterality Date  . ABDOMINAL HYSTERECTOMY  Aug 2011   [facility] TAH for fibroids   No Known Allergies Prior to Admission medications   Medication Sig Start Date End Date Taking? Authorizing Provider  buPROPion (WELLBUTRIN SR) 100 MG 12 hr tablet Take 1 tablet (100 mg total) by mouth daily. 06/27/15  Yes Gay Filler Copland, MD  cholecalciferol (VITAMIN D) 1000 UNITS tablet Take 1,000 Units by mouth daily.   Yes Historical Provider, MD  Omega-3 Fatty Acids (FISH OIL PO) Take by mouth.   Yes Historical Provider, MD  sertraline (ZOLOFT) 100 MG tablet Take 150 mg by mouth daily.   Yes Historical Provider, MD   Social History   Social History  . Marital status: Married    Spouse name: N/A  . Number of children: N/A  . Years of education: N/A   Occupational History  . Law Student    Social History Main Topics  . Smoking status: Former Smoker    Packs/day: 0.50    Years: 15.00    Types: Cigarettes    Quit date: 06/23/1995  . Smokeless tobacco: Never Used  . Alcohol use No  . Drug use: No  . Sexual activity: Yes  Other Topics Concern  . Not on file   Social History Narrative   Married. Education: [employer]. Exercise: Gym 2 times a week fit walking.                          Review of Systems  Constitutional: Negative for fatigue and unexpected weight change.  Respiratory: Negative for chest tightness and shortness of breath.   Cardiovascular: Positive for palpitations. Negative for chest pain and leg swelling.  Gastrointestinal: Negative for abdominal pain and blood in stool.  Neurological: Negative for dizziness, syncope, light-headedness and headaches.  Psychiatric/Behavioral: The patient is nervous/anxious.        Objective:   Physical Exam   Constitutional: She is oriented to person, place, and time. She appears well-developed and well-nourished.  HENT:  Head: Normocephalic and atraumatic.  Eyes: Conjunctivae and EOM are normal. Pupils are equal, round, and reactive to light.  Neck: Carotid bruit is not present.  Cardiovascular: Normal rate, regular rhythm, normal heart sounds and intact distal pulses.   Pulmonary/Chest: Effort normal and breath sounds normal.  Abdominal: Soft. She exhibits no pulsatile midline mass. There is no tenderness.  Neurological: She is alert and oriented to person, place, and time.  Skin: Skin is warm and dry.  Psychiatric: She has a normal mood and affect. Her behavior is normal.  Vitals reviewed.   EKG: sinus rhythm, no acute findings. Vitals:   05/05/16 1230  BP: 130/90  Pulse: 90  Resp: 18  Temp: 98.4 F (36.9 C)  TempSrc: Oral  SpO2: 97%  Weight: 185 lb (83.9 kg)  Height: 5' 8.5" (1.74 m)   Results for orders placed or performed in visit on 05/05/16  POCT urinalysis dipstick  Result Value Ref Range   Color, UA yellow yellow   Clarity, UA clear clear   Glucose, UA negative negative   Bilirubin, UA negative negative   Ketones, POC UA negative negative   Spec Grav, UA 1.020    Blood, UA negative negative   pH, UA 5.5    Protein Ur, POC negative negative   Urobilinogen, UA 0.2    Nitrite, UA Negative Negative   Leukocytes, UA Negative Negative        Assessment:      Caitlin Monroe is a 50 y.o. female Palpitations - Plan: POCT urinalysis dipstick, EKG 12-Lead, TSH, metoprolol tartrate (LOPRESSOR) 25 MG tablet Situational stress  - Suspected combination of palpitations due to increased stress with recent breast cancer diagnosis, and prior caffeine intake. Also with situational stressor of not passing her bar exam. Overall appears to be handling these stressors appropriately, and has psychiatrist and psychologist she is working with. Has also continued to exercise.  -  Elevated blood pressure may be due in  part to stress versus early/stage I hypertension with borderline readings in the past by her report.  -Check TSH.  CBC and CMP were reviewed that were overall reassuring. No proteinuria.  -Start low-dose metoprolol 12.5 mg twice a day, can increase to 25 mg twice a day if blood pressure remains over 140/91 week. Recheck in 3 weeks.   Elevated blood pressure - Plan: POCT urinalysis dipstick, EKG 12-Lead, TSH, metoprolol tartrate (LOPRESSOR) 25 MG tablet  - As above, start low-dose metoprolol, recheck in 3 weeks with home readings.   Meds ordered this encounter  Medications  . metoprolol tartrate (LOPRESSOR) 25 MG tablet    Sig: Take 0.5 tablets (12.5 mg total) by mouth  2 (two) times daily. May increase after 1 week to 1 tab po BID if blood pressure elevated.    Dispense:  60 tablet    Refill:  1   Patient Instructions       IF you received an x-ray today, you will receive an invoice from [facility]. Please contact [facility] at [phone] with questions or concerns regarding your invoice.   IF you received labwork today, you will receive an invoice from [employer]. Please contact [facility] at [phone] with questions or concerns regarding your invoice.   Our billing staff will not be able to assist you with questions regarding bills from these companies.  You will be contacted with the lab results as soon as they are available. The fastest way to get your results is to activate your My Chart account. Instructions are located on the last page of this paperwork. If you have not heard from [place] regarding the results in 2 weeks, please contact this office.     Okay to start low-dose metoprolol one half pill twice per day for now. Continue to avoid large doses of caffeine, and keep an eye on your blood pressure readings. If you are remaining over 140/90 in the next week, can increase to one full pill  twice per day. Follow-up with me in the next 3-4 weeks, sooner if any worsening of your symptoms. Let me know if I can help in the meantime.   How to Take Your Blood Pressure HOW DO I GET A BLOOD PRESSURE MACHINE?  You can buy an electronic home blood pressure machine at your local pharmacy. Insurance will sometimes cover the cost if you have a prescription.  Ask your doctor what type of machine is best for you. There are different machines for your arm and your wrist.  If you decide to buy a machine to check your blood pressure on your arm, first check the size of your arm so you can buy the right size cuff. To check the size of your arm:   Use a measuring tape that shows both inches and centimeters.   Wrap the measuring tape around the upper-middle part of your arm. You may need someone to help you measure.   Write down your arm measurement in both inches and centimeters.   To measure your blood pressure correctly, it is important to have the right size cuff.   If your arm is up to 13 inches (up to 34 centimeters), get an adult cuff size.  If your arm is 13 to 17 inches (35 to 44 centimeters), get a large adult cuff size.    If your arm is 17 to 20 inches (45 to 52 centimeters), get an adult thigh cuff.  WHAT DO THE NUMBERS MEAN?   There are two numbers that make up your blood pressure. For example: 120/80.  The first number (120 in our example) is called the "systolic pressure." It is a measure of the pressure in your blood vessels when your heart is pumping blood.  The second number (80 in our example) is called the "diastolic pressure." It is a measure of the pressure in your blood vessels when your heart is resting between beats.  Your doctor will tell you what your blood pressure should be. WHAT SHOULD I DO BEFORE I CHECK MY BLOOD PRESSURE?   Try to rest or relax for at least 30 minutes before you check your blood pressure.  Do not smoke.  Do not  have any drinks  with caffeine, such as:  Soda.  Coffee.  Tea.  Check your blood pressure in a quiet room.  Sit down and stretch out your arm on a table. Keep your arm at about the level of your heart. Let your arm relax.  Make sure that your legs are not crossed. HOW DO I CHECK MY BLOOD PRESSURE?  Follow the directions that came with your machine.  Make sure you remove any tight-fitting clothing from your arm or wrist. Wrap the cuff around your upper arm or wrist. You should be able to fit a finger between the cuff and your arm. If you cannot fit a finger between the cuff and your arm, it is too tight and should be removed and rewrapped.  Some units require you to manually pump up the arm cuff.  Automatic units inflate the cuff when you press a button.  Cuff deflation is automatic in both models.  After the cuff is inflated, the unit measures your blood pressure and pulse. The readings are shown on a monitor. Hold still and breathe normally while the cuff is inflated.  Getting a reading takes less than a minute.  Some models store readings in a memory. Some provide a printout of readings. If your machine does not store your readings, keep a written record.  Take readings with you to your next visit with your doctor.   This information is not intended to replace advice given to you by your health care provider. Make sure you discuss any questions you have with your health care provider.   Document Released: 07/24/2008 Document Revised: 09/01/2014 Document Reviewed: 10/06/2013 Elsevier Interactive Patient Education 2016 [employer].   Palpitations A palpitation is the feeling that your heartbeat is irregular or is faster than normal. It may feel like your heart is fluttering or skipping a beat. Palpitations are usually not a serious problem. However, in some cases, you may need further medical evaluation. CAUSES  Palpitations can be caused by:  Smoking.  Caffeine or other stimulants,  such as diet pills or energy drinks.  Alcohol.  Stress and anxiety.  Strenuous physical activity.  Fatigue.  Certain medicines.  Heart disease, especially if you have a history of irregular heart rhythms (arrhythmias), such as atrial fibrillation, atrial flutter, or supraventricular tachycardia.  An improperly working pacemaker or defibrillator. DIAGNOSIS  To find the cause of your palpitations, your health care provider will take your medical history and perform a physical exam. Your health care provider may also have you take a test called an ambulatory electrocardiogram (ECG). An ECG records your heartbeat patterns over a 24-hour period. You may also have other tests, such as:  Transthoracic echocardiogram (TTE). During echocardiography, sound waves are used to evaluate how blood flows through your heart.  Transesophageal echocardiogram (TEE).  Cardiac monitoring. This allows your health care provider to monitor your heart rate and rhythm in real time.  Holter monitor. This is a portable device that records your heartbeat and can help diagnose heart arrhythmias. It allows your health care provider to track your heart activity for several days, if needed.  Stress tests by exercise or by giving medicine that makes the heart beat faster. TREATMENT  Treatment of palpitations depends on the cause of your symptoms and can vary greatly. Most cases of palpitations do not require any treatment other than time, relaxation, and monitoring your symptoms. Other causes, such as atrial fibrillation, atrial flutter, or supraventricular tachycardia, usually require further treatment. HOME CARE  INSTRUCTIONS   Avoid:  Caffeinated coffee, tea, soft drinks, diet pills, and energy drinks.  Chocolate.  Alcohol.  Stop smoking if you smoke.  Reduce your stress and anxiety. Things that can help you relax include:  A method of controlling things in your body, such as your heartbeats, with your  mind (biofeedback).  Yoga.  Meditation.  Physical activity such as swimming, jogging, or walking.  Get plenty of rest and sleep. SEEK MEDICAL CARE IF:   You continue to have a fast or irregular heartbeat beyond 24 hours.  Your palpitations occur more often. SEEK IMMEDIATE MEDICAL CARE IF:  You have chest pain or shortness of breath.  You have a severe headache.  You feel dizzy or you faint. MAKE SURE YOU:  Understand these instructions.  Will watch your condition.  Will get help right away if you are not doing well or get worse.   This information is not intended to replace advice given to you by your health care provider. Make sure you discuss any questions you have with your health care provider.   Document Released: 08/08/2000 Document Revised: 08/16/2013 Document Reviewed: 10/10/2011 Elsevier Interactive Patient Education [employer].          Plan:        As above.

## 2016-05-05 NOTE — Patient Instructions (Addendum)
IF you received an x-ray today, you will receive an invoice from [facility]. Please contact [facility] at [phone] with questions or concerns regarding your invoice.   IF you received labwork today, you will receive an invoice from [employer]. Please contact [facility] at [phone] with questions or concerns regarding your invoice.   Our billing staff will not be able to assist you with questions regarding bills from these companies.  You will be contacted with the lab results as soon as they are available. The fastest way to get your results is to activate your My Chart account. Instructions are located on the last page of this paperwork. If you have not heard from [place] regarding the results in 2 weeks, please contact this office.     Okay to start low-dose metoprolol one half pill twice per day for now. Continue to avoid large doses of caffeine, and keep an eye on your blood pressure readings. If you are remaining over 140/90 in the next week, can increase to one full pill twice per day. Follow-up with me in the next 3-4 weeks, sooner if any worsening of your symptoms. Let me know if I can help in the meantime.   How to Take Your Blood Pressure HOW DO I GET A BLOOD PRESSURE MACHINE?  You can buy an electronic home blood pressure machine at your local pharmacy. Insurance will sometimes cover the cost if you have a prescription.  Ask your doctor what type of machine is best for you. There are different machines for your arm and your wrist.  If you decide to buy a machine to check your blood pressure on your arm, first check the size of your arm so you can buy the right size cuff. To check the size of your arm:   Use a measuring tape that shows both inches and centimeters.   Wrap the measuring tape around the upper-middle part of your arm. You may need someone to help you measure.   Write down your arm measurement in both inches  and centimeters.   To measure your blood pressure correctly, it is important to have the right size cuff.   If your arm is up to 13 inches (up to 34 centimeters), get an adult cuff size.  If your arm is 13 to 17 inches (35 to 44 centimeters), get a large adult cuff size.    If your arm is 17 to 20 inches (45 to 52 centimeters), get an adult thigh cuff.  WHAT DO THE NUMBERS MEAN?   There are two numbers that make up your blood pressure. For example: 120/80.  The first number (120 in our example) is called the "systolic pressure." It is a measure of the pressure in your blood vessels when your heart is pumping blood.  The second number (80 in our example) is called the "diastolic pressure." It is a measure of the pressure in your blood vessels when your heart is resting between beats.  Your doctor will tell you what your blood pressure should be. WHAT SHOULD I DO BEFORE I CHECK MY BLOOD PRESSURE?   Try to rest or relax for at least 30 minutes before you check your blood pressure.  Do not smoke.  Do not have any drinks with caffeine, such as:  Soda.  Coffee.  Tea.  Check your blood pressure in a quiet room.  Sit down and stretch out your arm on a table. Keep your arm at about  the level of your heart. Let your arm relax.  Make sure that your legs are not crossed. HOW DO I CHECK MY BLOOD PRESSURE?  Follow the directions that came with your machine.  Make sure you remove any tight-fitting clothing from your arm or wrist. Wrap the cuff around your upper arm or wrist. You should be able to fit a finger between the cuff and your arm. If you cannot fit a finger between the cuff and your arm, it is too tight and should be removed and rewrapped.  Some units require you to manually pump up the arm cuff.  Automatic units inflate the cuff when you press a button.  Cuff deflation is automatic in both models.  After the cuff is inflated, the unit measures your blood pressure and  pulse. The readings are shown on a monitor. Hold still and breathe normally while the cuff is inflated.  Getting a reading takes less than a minute.  Some models store readings in a memory. Some provide a printout of readings. If your machine does not store your readings, keep a written record.  Take readings with you to your next visit with your doctor.   This information is not intended to replace advice given to you by your health care provider. Make sure you discuss any questions you have with your health care provider.   Document Released: 07/24/2008 Document Revised: 09/01/2014 Document Reviewed: 10/06/2013 Elsevier Interactive Patient Education 2016 [employer].   Palpitations A palpitation is the feeling that your heartbeat is irregular or is faster than normal. It may feel like your heart is fluttering or skipping a beat. Palpitations are usually not a serious problem. However, in some cases, you may need further medical evaluation. CAUSES  Palpitations can be caused by:  Smoking.  Caffeine or other stimulants, such as diet pills or energy drinks.  Alcohol.  Stress and anxiety.  Strenuous physical activity.  Fatigue.  Certain medicines.  Heart disease, especially if you have a history of irregular heart rhythms (arrhythmias), such as atrial fibrillation, atrial flutter, or supraventricular tachycardia.  An improperly working pacemaker or defibrillator. DIAGNOSIS  To find the cause of your palpitations, your health care provider will take your medical history and perform a physical exam. Your health care provider may also have you take a test called an ambulatory electrocardiogram (ECG). An ECG records your heartbeat patterns over a 24-hour period. You may also have other tests, such as:  Transthoracic echocardiogram (TTE). During echocardiography, sound waves are used to evaluate how blood flows through your heart.  Transesophageal echocardiogram (TEE).  Cardiac  monitoring. This allows your health care provider to monitor your heart rate and rhythm in real time.  Holter monitor. This is a portable device that records your heartbeat and can help diagnose heart arrhythmias. It allows your health care provider to track your heart activity for several days, if needed.  Stress tests by exercise or by giving medicine that makes the heart beat faster. TREATMENT  Treatment of palpitations depends on the cause of your symptoms and can vary greatly. Most cases of palpitations do not require any treatment other than time, relaxation, and monitoring your symptoms. Other causes, such as atrial fibrillation, atrial flutter, or supraventricular tachycardia, usually require further treatment. HOME CARE INSTRUCTIONS   Avoid:  Caffeinated coffee, tea, soft drinks, diet pills, and energy drinks.  Chocolate.  Alcohol.  Stop smoking if you smoke.  Reduce your stress and anxiety. Things that can help you relax include:  A method of controlling things in your body, such as your heartbeats, with your mind (biofeedback).  Yoga.  Meditation.  Physical activity such as swimming, jogging, or walking.  Get plenty of rest and sleep. SEEK MEDICAL CARE IF:   You continue to have a fast or irregular heartbeat beyond 24 hours.  Your palpitations occur more often. SEEK IMMEDIATE MEDICAL CARE IF:  You have chest pain or shortness of breath.  You have a severe headache.  You feel dizzy or you faint. MAKE SURE YOU:  Understand these instructions.  Will watch your condition.  Will get help right away if you are not doing well or get worse.   This information is not intended to replace advice given to you by your health care provider. Make sure you discuss any questions you have with your health care provider.   Document Released: 08/08/2000 Document Revised: 08/16/2013 Document Reviewed: 10/10/2011 Elsevier Interactive Patient Education [employer].

## 2016-05-06 ENCOUNTER — Telehealth: Payer: Self-pay | Admitting: *Deleted

## 2016-05-06 DIAGNOSIS — C50919 Malignant neoplasm of unspecified site of unspecified female breast: Secondary | ICD-10-CM | POA: Insufficient documentation

## 2016-05-06 LAB — TSH: TSH: 0.91 mIU/L

## 2016-05-06 NOTE — Telephone Encounter (Signed)
Called pt to discuss Caitlin Monroe from 04/30/16. Denies questions or concerns regarding dx. Pt has decided to have MRI bx of right breast. She is scheduled to see Dr. Harlow Mares for reconstruction consult on 05/08/16. Informed Dr. Excell Seltzer pts decision for bx. Encourage pt to call with further questions. Received verbal understanding.

## 2016-05-13 ENCOUNTER — Telehealth: Payer: Self-pay | Admitting: Genetic Counselor

## 2016-05-13 ENCOUNTER — Ambulatory Visit: Payer: Self-pay | Admitting: Genetic Counselor

## 2016-05-13 ENCOUNTER — Encounter: Payer: Self-pay | Admitting: Genetic Counselor

## 2016-05-13 DIAGNOSIS — Z1379 Encounter for other screening for genetic and chromosomal anomalies: Secondary | ICD-10-CM

## 2016-05-13 DIAGNOSIS — C50411 Malignant neoplasm of upper-outer quadrant of right female breast: Secondary | ICD-10-CM

## 2016-05-13 DIAGNOSIS — Z8042 Family history of malignant neoplasm of prostate: Secondary | ICD-10-CM

## 2016-05-13 DIAGNOSIS — Z803 Family history of malignant neoplasm of breast: Secondary | ICD-10-CM

## 2016-05-13 NOTE — Telephone Encounter (Signed)
Negative genetic testing on the Breast/[place] cancer panel.  Discussed that we do not know why she developed breast cancer but that it does not appear to be due to a hereditary mutation in the genes we evaluated.

## 2016-05-13 NOTE — Progress Notes (Signed)
HPI: Caitlin Monroe was previously seen in the [place] clinic due to a personal and family history of cancer and concerns regarding a hereditary predisposition to cancer. Please refer to our prior cancer genetics clinic note for more information regarding Caitlin Monroe's medical, social and family histories, and our assessment and recommendations, at the time. Caitlin Monroe recent genetic test results were disclosed to her, as were recommendations warranted by these results. These results and recommendations are discussed in more detail below.  FAMILY HISTORY:  We obtained a detailed, 4-generation family history.  Significant diagnoses are listed below: Family History  Problem Relation Age of Onset  . Hypertension Mother   . Depression Mother   . Hyperlipidemia Mother   . Heart disease Mother   . Prostate cancer Father 34  . Mental illness Sister   . Hypertension Sister   . Prostate cancer Brother     dx in his late 32s  . Mental illness Maternal Grandmother   . Breast cancer Maternal Grandmother     dx in her 27s  . Lung cancer Maternal Grandmother     smoker since age 45; dx in her 82s  . Cancer Maternal Grandfather   . Heart disease Maternal Grandfather   . Mental illness Maternal Grandfather   . Lung cancer Paternal Grandmother     dx in her 87s, life long smoker  . Prostate cancer Paternal Grandfather   . Allergies Daughter   . Anxiety disorder Daughter   . Melanoma Brother 78  . Bladder Cancer Sister 74    The patient has a biological daughter and adopted son.  She has two brothers and two sisters.  One brother was diagnosed with prostate cancer in his 57's, the other brother was diagnosed with melanoma in his 80's.  Her oldest sister was diagnosed with bladder cancer at 33 and the other sister died by suicide at 55.  Her father was diagnosed with prostate cancer in his 47's.  He has one brother who is alive at 43.  His father died from prostate cancer at 56 and  his mother died of lung cancer.  The patient's mother had one brother who died from unknown causes.  Her mother had breast cancer in her 64's and lung cancer in her 89's.  She had been a smoker from age 72.  There is no other reported family history.  Patient's maternal ancestors are of [place] and [place] descent, and paternal ancestors are of [place] descent. There is reported Ashkenazi Jewish ancestry. There is no known consanguinity  GENETIC TEST RESULTS: At the time of Caitlin Monroe's visit, we recommended she pursue genetic testing of the custom panel that incorporates genes from a melanoma panel as well as the breast/ovasrian cancer gene panel. The Custom gene panel offered by [facility] includes sequencing and rearrangement analysis for the following 24 genes:  ATM, BAP1, BARD1, BRCA1, BRCA2, BRIP1, CDH1, CDK4, CDKN2A, CHEK2, EPCAM, FANCC, MITF, MLH1, MSH2, MSH6, NBN, PALB2, PMS2, PTEN, RAD51C, RAD51D, TP53, and XRCC2.   The report date is May 12, 2016.  Genetic testing was normal, and did not reveal a deleterious mutation in these genes. The test report has been scanned into EPIC and is located under the Molecular Pathology section of the Results Review tab.   We discussed with Caitlin Monroe that since the current genetic testing is not perfect, it is possible there may be a gene mutation in one of these genes that current testing cannot detect, but  that chance is small. We also discussed, that it is possible that another gene that has not yet been discovered, or that we have not yet tested, is responsible for the cancer diagnoses in the family, and it is, therefore, important to remain in touch with cancer genetics in the future so that we can continue to offer Caitlin Monroe the most up to date genetic testing.   CANCER SCREENING RECOMMENDATIONS: This result is reassuring and indicates that Caitlin Monroe likely does not have an increased risk for a future cancer due to a mutation in one of  these genes. This normal test also suggests that Caitlin Monroe's cancer was most likely not due to an inherited predisposition associated with one of these genes.  Most cancers happen by chance and this negative test suggests that her cancer falls into this category.  We, therefore, recommended she continue to follow the cancer management and screening guidelines provided by her oncology and primary healthcare provider.   RECOMMENDATIONS FOR FAMILY MEMBERS: Women in this family might be at some increased risk of developing cancer, over the general population risk, simply due to the family history of cancer. We recommended women in this family have a yearly mammogram beginning at age 15, or 62 years younger than the earliest onset of cancer, an an annual clinical breast exam, and perform monthly breast self-exams. Women in this family should also have a gynecological exam as recommended by their primary provider. All family members should have a colonoscopy by age 23.  FOLLOW-UP: Lastly, we discussed with Caitlin Monroe that cancer genetics is a rapidly advancing field and it is possible that new genetic tests will be appropriate for her and/or her family members in the future. We encouraged her to remain in contact with cancer genetics on an annual basis so we can update her personal and family histories and let her know of advances in cancer genetics that may benefit this family.   Our contact number was provided. Caitlin Monroe questions were answered to her satisfaction, and she knows she is welcome to call us at anytime with additional questions or concerns.   Caitlin Kayser, MS, [facility] Certified Genetic Counselor [email or web address]

## 2016-05-14 ENCOUNTER — Other Ambulatory Visit: Payer: Self-pay | Admitting: General Surgery

## 2016-05-14 DIAGNOSIS — R928 Other abnormal and inconclusive findings on diagnostic imaging of breast: Secondary | ICD-10-CM

## 2016-05-16 ENCOUNTER — Telehealth: Payer: Self-pay | Admitting: *Deleted

## 2016-05-16 NOTE — Telephone Encounter (Signed)
Pt called to discuss surgery decision. Pt wishes to proceed with right mastectomy without reconstruction. Physician team notified.

## 2016-05-19 ENCOUNTER — Ambulatory Visit: Payer: Self-pay | Admitting: General Surgery

## 2016-05-19 DIAGNOSIS — C50411 Malignant neoplasm of upper-outer quadrant of right female breast: Secondary | ICD-10-CM

## 2016-05-20 ENCOUNTER — Other Ambulatory Visit: Payer: Self-pay | Admitting: General Surgery

## 2016-05-20 DIAGNOSIS — R928 Other abnormal and inconclusive findings on diagnostic imaging of breast: Secondary | ICD-10-CM

## 2016-05-23 ENCOUNTER — Encounter (HOSPITAL_COMMUNITY)
Admission: RE | Admit: 2016-05-23 | Discharge: 2016-05-23 | Disposition: A | Discharge status: Home / Self Care | Payer: PRIVATE HEALTH INSURANCE | Source: Ambulatory Visit | Attending: General Surgery | Admitting: General Surgery

## 2016-05-23 ENCOUNTER — Encounter (HOSPITAL_COMMUNITY): Payer: Self-pay

## 2016-05-23 DIAGNOSIS — Z01812 Encounter for preprocedural laboratory examination: Secondary | ICD-10-CM | POA: Diagnosis not present

## 2016-05-23 HISTORY — DX: Nausea with vomiting, unspecified: R11.2

## 2016-05-23 HISTORY — DX: Other specified postprocedural states: Z98.890

## 2016-05-23 HISTORY — DX: Essential (primary) hypertension: I10

## 2016-05-23 LAB — BASIC METABOLIC PANEL
ANION GAP: 7 (ref 5–15)
BUN: 16 mg/dL (ref 6–20)
CALCIUM: 9.3 mg/dL (ref 8.9–10.3)
CO2: 25 mmol/L (ref 22–32)
Chloride: 106 mmol/L (ref 101–111)
Creatinine, Ser: 0.88 mg/dL (ref 0.44–1.00)
Glucose, Bld: 90 mg/dL (ref 65–99)
POTASSIUM: 4.4 mmol/L (ref 3.5–5.1)
Sodium: 138 mmol/L (ref 135–145)

## 2016-05-23 LAB — CBC
HCT: 40.8 % (ref 36.0–46.0)
HEMOGLOBIN: 13.5 g/dL (ref 12.0–15.0)
MCH: 31.9 pg (ref 26.0–34.0)
MCHC: 33.1 g/dL (ref 30.0–36.0)
MCV: 96.5 fL (ref 78.0–100.0)
Platelets: 337 10*3/uL (ref 150–400)
RBC: 4.23 MIL/uL (ref 3.87–5.11)
RDW: 12.3 % (ref 11.5–15.5)
WBC: 8.4 10*3/uL (ref 4.0–10.5)

## 2016-05-23 NOTE — Progress Notes (Addendum)
PCP is Dr. [name] Denies ever seeing a cardiologist. Denies ever having a stress test, card cath, or echo. Ekg noted in epic 05-05-16 Pt states she wants lots of zofran, because she felt so bad after a prior surgery. Encouraged her to speak with the Anesthesia Dr on the day of surgery, voices understanding.

## 2016-05-23 NOTE — Pre-Procedure Instructions (Signed)
TUNYA JOAQUIN  05/23/2016      [facility] [place] [address] Phone: [phone] Fax: [phone]    Your procedure is scheduled on Oct. 3  Report to [facility] at 900 A.M.  Call this number if you have problems the morning of surgery:  [phone]   Remember:  Do not eat food or drink liquids after midnight.  Take these medicines the morning of surgery with A SIP OF WATER Bupropion (Wellbutrin SR), Metoprolol Tartrate (Lopressor), Sertraline (Zoloft)  Stop taking aspirin, Ibuprofen, Advil, Motrin, Herbal medications, Vitamins, BC's, Goody's, Fish Oil, Melatonin   Do not wear jewelry, make-up or nail polish.  Do not wear lotions, powders, or perfumes, or deoderant.  Do not shave 48 hours prior to surgery.  Men may shave face and neck.  Do not bring valuables to the hospital.  [facility] is not responsible for any belongings or valuables.  Contacts, dentures or bridgework may not be worn into surgery.  Leave your suitcase in the car.  After surgery it may be brought to your room.  For patients admitted to the hospital, discharge time will be determined by your treatment team.  Patients discharged the day of surgery will not be allowed to drive home.    Special instructions:  [facility] - Preparing for Surgery  Before surgery, you can play an important role.  Because skin is not sterile, your skin needs to be as free of germs as possible.  You can reduce the number of germs on you skin by washing with CHG (chlorahexidine gluconate) soap before surgery.  CHG is an antiseptic cleaner which kills germs and bonds with the skin to continue killing germs even after washing.  Please DO NOT use if you have an allergy to CHG or antibacterial soaps.  If your skin becomes reddened/irritated stop using the CHG and inform your nurse when you arrive at  Short Stay.  Do not shave (including legs and underarms) for at least 48 hours prior to the first CHG shower.  You may shave your face.  Please follow these instructions carefully:   1.  Shower with CHG Soap the night before surgery and the                                morning of Surgery.  2.  If you choose to wash your hair, wash your hair first as usual with your  normal shampoo.  3.  After you shampoo, rinse your hair and body thoroughly to remove the                      Shampoo.  4.  Use CHG as you would any other liquid soap.  You can apply chg directly to the skin and wash gently with scrungie or a clean washcloth.  5.  Apply the CHG Soap to your body ONLY FROM THE NECK DOWN.   Do not use on open wounds or open sores.  Avoid contact with your eyes, ears, mouth and genitals (private parts).  Wash genitals (private parts)  with your normal soap.  6.  Wash thoroughly, paying special attention to the area where your surgery  will be performed.  7.  Thoroughly rinse your body with warm water from the neck  down.  8.  DO NOT shower/wash with your normal soap after using and rinsing off   the CHG Soap.  9.  Pat yourself dry with a clean towel.            10.  Wear clean pajamas.            11.  Place clean sheets on your bed the night of your first shower and do not   sleep with pets.  Day of Surgery  Do not apply any lotions/deoderants the morning of surgery.  Please wear clean clothes to the hospital/surgery center.     Please read over the following fact sheets that you were given. Surgical Site Infection Prevention

## 2016-05-27 ENCOUNTER — Observation Stay (HOSPITAL_COMMUNITY)
Admission: RE | Admit: 2016-05-27 | Discharge: 2016-05-28 | Disposition: A | Discharge status: Home / Self Care | Payer: PRIVATE HEALTH INSURANCE | Source: Ambulatory Visit | Attending: General Surgery | Admitting: General Surgery

## 2016-05-27 ENCOUNTER — Ambulatory Visit (HOSPITAL_COMMUNITY): Payer: PRIVATE HEALTH INSURANCE | Admitting: Anesthesiology

## 2016-05-27 ENCOUNTER — Ambulatory Visit (HOSPITAL_COMMUNITY)
Admission: RE | Admit: 2016-05-27 | Discharge: 2016-05-27 | Disposition: A | Discharge status: Home / Self Care | Payer: PRIVATE HEALTH INSURANCE | Source: Ambulatory Visit | Attending: General Surgery | Admitting: General Surgery

## 2016-05-27 ENCOUNTER — Encounter (HOSPITAL_COMMUNITY)
Admission: RE | Disposition: A | Discharge status: Home / Self Care | Payer: Self-pay | Source: Ambulatory Visit | Attending: General Surgery

## 2016-05-27 ENCOUNTER — Encounter (HOSPITAL_COMMUNITY): Payer: Self-pay | Admitting: Surgery

## 2016-05-27 DIAGNOSIS — C50411 Malignant neoplasm of upper-outer quadrant of right female breast: Secondary | ICD-10-CM | POA: Diagnosis not present

## 2016-05-27 DIAGNOSIS — Z170 Estrogen receptor positive status [ER+]: Secondary | ICD-10-CM | POA: Diagnosis not present

## 2016-05-27 DIAGNOSIS — Z87891 Personal history of nicotine dependence: Secondary | ICD-10-CM | POA: Diagnosis not present

## 2016-05-27 DIAGNOSIS — I10 Essential (primary) hypertension: Secondary | ICD-10-CM | POA: Insufficient documentation

## 2016-05-27 DIAGNOSIS — C50911 Malignant neoplasm of unspecified site of right female breast: Secondary | ICD-10-CM | POA: Diagnosis present

## 2016-05-27 DIAGNOSIS — F329 Major depressive disorder, single episode, unspecified: Secondary | ICD-10-CM | POA: Insufficient documentation

## 2016-05-27 DIAGNOSIS — Z803 Family history of malignant neoplasm of breast: Secondary | ICD-10-CM | POA: Diagnosis not present

## 2016-05-27 DIAGNOSIS — N6011 Diffuse cystic mastopathy of right breast: Secondary | ICD-10-CM | POA: Insufficient documentation

## 2016-05-27 DIAGNOSIS — Z8042 Family history of malignant neoplasm of prostate: Secondary | ICD-10-CM | POA: Insufficient documentation

## 2016-05-27 DIAGNOSIS — F419 Anxiety disorder, unspecified: Secondary | ICD-10-CM | POA: Insufficient documentation

## 2016-05-27 HISTORY — PX: MASTECTOMY COMPLETE / SIMPLE W/ SENTINEL NODE BIOPSY: SUR846

## 2016-05-27 HISTORY — DX: Other seasonal allergic rhinitis: J30.2

## 2016-05-27 HISTORY — PX: MASTECTOMY W/ SENTINEL NODE BIOPSY: SHX2001

## 2016-05-27 SURGERY — MASTECTOMY WITH SENTINEL LYMPH NODE BIOPSY
Anesthesia: General | Site: Breast | Laterality: Right

## 2016-05-27 MED ORDER — DEXAMETHASONE SODIUM PHOSPHATE 10 MG/ML IJ SOLN
INTRAMUSCULAR | Status: AC
  Filled 2016-05-27: qty 1

## 2016-05-27 MED ORDER — ENOXAPARIN SODIUM 40 MG/0.4ML ~~LOC~~ SOLN
40.0000 mg | SUBCUTANEOUS | Status: DC
  Administered 2016-05-28: 40 mg via SUBCUTANEOUS
  Filled 2016-05-27: qty 0.4

## 2016-05-27 MED ORDER — ONDANSETRON HCL 4 MG/2ML IJ SOLN
INTRAMUSCULAR | Status: AC
  Filled 2016-05-27: qty 2

## 2016-05-27 MED ORDER — IBUPROFEN 200 MG PO TABS: 200.0000 mg | ORAL_TABLET | Freq: Four times a day (QID) | ORAL | Status: DC | PRN

## 2016-05-27 MED ORDER — MELATONIN 3 MG PO TABS
3.0000 mg | ORAL_TABLET | Freq: Every evening | ORAL | Status: DC | PRN
  Filled 2016-05-27: qty 1

## 2016-05-27 MED ORDER — OXYCODONE HCL 5 MG PO TABS: 5.0000 mg | ORAL_TABLET | Freq: Once | ORAL | Status: DC | PRN

## 2016-05-27 MED ORDER — EPHEDRINE 5 MG/ML INJ
INTRAVENOUS | Status: AC
Start: 2016-05-27 — End: 2016-05-27
  Filled 2016-05-27: qty 10

## 2016-05-27 MED ORDER — CHLORHEXIDINE GLUCONATE CLOTH 2 % EX PADS: 6.0000 | MEDICATED_PAD | Freq: Once | CUTANEOUS | Status: DC

## 2016-05-27 MED ORDER — MIDAZOLAM HCL 2 MG/2ML IJ SOLN
INTRAMUSCULAR | Status: AC
  Administered 2016-05-27: 1 mg via INTRAVENOUS
  Filled 2016-05-27: qty 2

## 2016-05-27 MED ORDER — DEXTROSE 5 % IV SOLN
INTRAVENOUS | Status: DC | PRN
  Administered 2016-05-27: 50 ug/min via INTRAVENOUS

## 2016-05-27 MED ORDER — DIPHENHYDRAMINE HCL 50 MG/ML IJ SOLN
INTRAMUSCULAR | Status: DC | PRN
  Administered 2016-05-27: 12.5 mg via INTRAVENOUS

## 2016-05-27 MED ORDER — LIDOCAINE 2% (20 MG/ML) 5 ML SYRINGE
INTRAMUSCULAR | Status: AC
  Filled 2016-05-27: qty 5

## 2016-05-27 MED ORDER — BUPROPION HCL ER (SR) 100 MG PO TB12
100.0000 mg | ORAL_TABLET | Freq: Every day | ORAL | Status: DC
  Administered 2016-05-28: 100 mg via ORAL
  Filled 2016-05-27: qty 1

## 2016-05-27 MED ORDER — MIDAZOLAM HCL 2 MG/2ML IJ SOLN
1.0000 mg | Freq: Once | INTRAMUSCULAR | Status: AC
  Administered 2016-05-27: 1 mg via INTRAVENOUS

## 2016-05-27 MED ORDER — LACTATED RINGERS IV SOLN
INTRAVENOUS | Status: DC
  Administered 2016-05-27 (×3): via INTRAVENOUS

## 2016-05-27 MED ORDER — ONDANSETRON HCL 4 MG/2ML IJ SOLN
INTRAMUSCULAR | Status: DC | PRN
  Administered 2016-05-27: 4 mg via INTRAVENOUS

## 2016-05-27 MED ORDER — FENTANYL CITRATE (PF) 100 MCG/2ML IJ SOLN
INTRAMUSCULAR | Status: AC
  Administered 2016-05-27: 50 ug via INTRAVENOUS
  Filled 2016-05-27: qty 2

## 2016-05-27 MED ORDER — LIDOCAINE HCL (CARDIAC) 20 MG/ML IV SOLN
INTRAVENOUS | Status: DC | PRN
  Administered 2016-05-27: 60 mg via INTRAVENOUS

## 2016-05-27 MED ORDER — PHENYLEPHRINE HCL 10 MG/ML IJ SOLN
INTRAMUSCULAR | Status: DC | PRN
  Administered 2016-05-27: 40 ug via INTRAVENOUS
  Administered 2016-05-27 (×4): 80 ug via INTRAVENOUS

## 2016-05-27 MED ORDER — DIPHENHYDRAMINE HCL 50 MG/ML IJ SOLN
INTRAMUSCULAR | Status: AC
  Filled 2016-05-27: qty 1

## 2016-05-27 MED ORDER — OXYCODONE-ACETAMINOPHEN 5-325 MG PO TABS
ORAL_TABLET | ORAL | Status: AC
  Filled 2016-05-27: qty 1

## 2016-05-27 MED ORDER — BUPIVACAINE-EPINEPHRINE (PF) 0.5% -1:200000 IJ SOLN
INTRAMUSCULAR | Status: DC | PRN
  Administered 2016-05-27: 30 mL via PERINEURAL

## 2016-05-27 MED ORDER — PROPOFOL 1000 MG/100ML IV EMUL
INTRAVENOUS | Status: AC
  Filled 2016-05-27: qty 100

## 2016-05-27 MED ORDER — CEFAZOLIN SODIUM-DEXTROSE 2-4 GM/100ML-% IV SOLN
2.0000 g | INTRAVENOUS | Status: AC
  Administered 2016-05-27: 2 g via INTRAVENOUS
  Filled 2016-05-27: qty 100

## 2016-05-27 MED ORDER — HYDROMORPHONE HCL 1 MG/ML IJ SOLN: 0.2500 mg | INTRAMUSCULAR | Status: DC | PRN

## 2016-05-27 MED ORDER — OXYCODONE-ACETAMINOPHEN 5-325 MG PO TABS
ORAL_TABLET | ORAL | Status: AC
  Administered 2016-05-27: 2 via ORAL
  Filled 2016-05-27: qty 1

## 2016-05-27 MED ORDER — ONDANSETRON HCL 4 MG/2ML IJ SOLN: 4.0000 mg | Freq: Four times a day (QID) | INTRAMUSCULAR | Status: DC | PRN

## 2016-05-27 MED ORDER — METOPROLOL TARTRATE 25 MG PO TABS
25.0000 mg | ORAL_TABLET | Freq: Two times a day (BID) | ORAL | Status: DC
  Administered 2016-05-27: 25 mg via ORAL
  Filled 2016-05-27 (×2): qty 1

## 2016-05-27 MED ORDER — METHYLENE BLUE 0.5 % INJ SOLN
INTRAVENOUS | Status: AC
  Filled 2016-05-27: qty 10

## 2016-05-27 MED ORDER — PROPOFOL 500 MG/50ML IV EMUL
INTRAVENOUS | Status: DC | PRN
  Administered 2016-05-27: 50 ug/kg/min via INTRAVENOUS

## 2016-05-27 MED ORDER — INFLUENZA VAC SPLIT QUAD 0.5 ML IM SUSY
0.5000 mL | PREFILLED_SYRINGE | INTRAMUSCULAR | Status: AC
  Administered 2016-05-28: 0.5 mL via INTRAMUSCULAR
  Filled 2016-05-27: qty 0.5

## 2016-05-27 MED ORDER — ACETAMINOPHEN 500 MG PO TABS
1000.0000 mg | ORAL_TABLET | ORAL | Status: AC
  Administered 2016-05-27: 1000 mg via ORAL
  Filled 2016-05-27: qty 2

## 2016-05-27 MED ORDER — EPHEDRINE SULFATE 50 MG/ML IJ SOLN
INTRAMUSCULAR | Status: DC | PRN
  Administered 2016-05-27: 10 mg via INTRAVENOUS
  Administered 2016-05-27: 5 mg via INTRAVENOUS
  Administered 2016-05-27 (×3): 10 mg via INTRAVENOUS
  Administered 2016-05-27: 5 mg via INTRAVENOUS

## 2016-05-27 MED ORDER — PROPOFOL 10 MG/ML IV BOLUS
INTRAVENOUS | Status: DC | PRN
  Administered 2016-05-27: 160 mg via INTRAVENOUS

## 2016-05-27 MED ORDER — LACTATED RINGERS IV SOLN
INTRAVENOUS | Status: DC
  Administered 2016-05-27: 20:00:00 via INTRAVENOUS

## 2016-05-27 MED ORDER — GABAPENTIN 300 MG PO CAPS
300.0000 mg | ORAL_CAPSULE | ORAL | Status: AC
  Administered 2016-05-27: 300 mg via ORAL
  Filled 2016-05-27: qty 1

## 2016-05-27 MED ORDER — DEXAMETHASONE SODIUM PHOSPHATE 10 MG/ML IJ SOLN
INTRAMUSCULAR | Status: DC | PRN
  Administered 2016-05-27: 10 mg via INTRAVENOUS

## 2016-05-27 MED ORDER — SCOPOLAMINE 1 MG/3DAYS TD PT72
1.0000 | MEDICATED_PATCH | TRANSDERMAL | Status: DC
  Administered 2016-05-27: 1.5 mg via TRANSDERMAL
  Filled 2016-05-27: qty 1

## 2016-05-27 MED ORDER — OXYCODONE HCL 5 MG/5ML PO SOLN: 5.0000 mg | Freq: Once | ORAL | Status: DC | PRN

## 2016-05-27 MED ORDER — OXYCODONE-ACETAMINOPHEN 5-325 MG PO TABS
1.0000 | ORAL_TABLET | ORAL | Status: DC | PRN
  Administered 2016-05-27 – 2016-05-28 (×5): 2 via ORAL
  Filled 2016-05-27 (×5): qty 2

## 2016-05-27 MED ORDER — FENTANYL CITRATE (PF) 100 MCG/2ML IJ SOLN
INTRAMUSCULAR | Status: DC | PRN
  Administered 2016-05-27: 100 ug via INTRAVENOUS
  Administered 2016-05-27 (×2): 50 ug via INTRAVENOUS

## 2016-05-27 MED ORDER — FENTANYL CITRATE (PF) 100 MCG/2ML IJ SOLN
INTRAMUSCULAR | Status: AC
  Filled 2016-05-27: qty 4

## 2016-05-27 MED ORDER — ROCURONIUM BROMIDE 10 MG/ML (PF) SYRINGE
PREFILLED_SYRINGE | INTRAVENOUS | Status: AC
  Filled 2016-05-27: qty 10

## 2016-05-27 MED ORDER — SERTRALINE HCL 50 MG PO TABS
150.0000 mg | ORAL_TABLET | Freq: Every day | ORAL | Status: DC
  Administered 2016-05-27: 150 mg via ORAL
  Filled 2016-05-27 (×2): qty 1

## 2016-05-27 MED ORDER — ONDANSETRON 4 MG PO TBDP: 4.0000 mg | ORAL_TABLET | Freq: Four times a day (QID) | ORAL | Status: DC | PRN

## 2016-05-27 MED ORDER — SCOPOLAMINE 1 MG/3DAYS TD PT72
MEDICATED_PATCH | TRANSDERMAL | Status: AC
  Filled 2016-05-27: qty 1

## 2016-05-27 MED ORDER — TECHNETIUM TC 99M SULFUR COLLOID FILTERED
1.0000 | Freq: Once | INTRAVENOUS | Status: AC | PRN
  Administered 2016-05-27: 1 via INTRADERMAL

## 2016-05-27 MED ORDER — PROPOFOL 10 MG/ML IV BOLUS
INTRAVENOUS | Status: AC
  Filled 2016-05-27: qty 20

## 2016-05-27 MED ORDER — FENTANYL CITRATE (PF) 100 MCG/2ML IJ SOLN
50.0000 ug | Freq: Once | INTRAMUSCULAR | Status: AC
  Administered 2016-05-27: 50 ug via INTRAVENOUS
  Filled 2016-05-27: qty 1

## 2016-05-27 MED ORDER — MORPHINE SULFATE (PF) 2 MG/ML IV SOLN: 2.0000 mg | INTRAVENOUS | Status: DC | PRN

## 2016-05-27 MED ORDER — FENTANYL CITRATE (PF) 100 MCG/2ML IJ SOLN
50.0000 ug | Freq: Once | INTRAMUSCULAR | Status: AC
  Administered 2016-05-27: 50 ug via INTRAVENOUS

## 2016-05-27 MED ORDER — 0.9 % SODIUM CHLORIDE (POUR BTL) OPTIME
TOPICAL | Status: DC | PRN
Start: 2016-05-27 — End: 2016-05-27
  Administered 2016-05-27: 1000 mL

## 2016-05-27 MED ORDER — CELECOXIB 200 MG PO CAPS
400.0000 mg | ORAL_CAPSULE | ORAL | Status: AC
  Administered 2016-05-27: 400 mg via ORAL
  Filled 2016-05-27: qty 2

## 2016-05-27 SURGICAL SUPPLY — 54 items
ADH SKN CLS APL DERMABOND .7 (GAUZE/BANDAGES/DRESSINGS) ×1
BINDER BREAST LRG (GAUZE/BANDAGES/DRESSINGS) ×1 IMPLANT
BINDER BREAST XLRG (GAUZE/BANDAGES/DRESSINGS) IMPLANT
CANISTER SUCTION 2500CC (MISCELLANEOUS) ×4 IMPLANT
CHLORAPREP W/TINT 26ML (MISCELLANEOUS) ×2 IMPLANT
CLIP TI MEDIUM 6 (CLIP) ×3 IMPLANT
CONT SPEC 4OZ CLIKSEAL STRL BL (MISCELLANEOUS) ×3 IMPLANT
COVER PROBE W GEL 5X96 (DRAPES) ×2 IMPLANT
COVER SURGICAL LIGHT HANDLE (MISCELLANEOUS) ×2 IMPLANT
DERMABOND ADVANCED (GAUZE/BANDAGES/DRESSINGS) ×1
DERMABOND ADVANCED .7 DNX12 (GAUZE/BANDAGES/DRESSINGS) IMPLANT
DEVICE DISSECT PLASMABLAD 3.0S (MISCELLANEOUS) ×1 IMPLANT
DRAIN CHANNEL 19F RND (DRAIN) ×2 IMPLANT
DRAPE CHEST BREAST 15X10 FENES (DRAPES) ×2 IMPLANT
DRAPE SURG 17X23 STRL (DRAPES) ×8 IMPLANT
DRSG PAD ABDOMINAL 8X10 ST (GAUZE/BANDAGES/DRESSINGS) ×1 IMPLANT
ELECT CAUTERY BLADE 6.4 (BLADE) ×2 IMPLANT
ELECT REM PT RETURN 9FT ADLT (ELECTROSURGICAL) ×4
ELECTRODE REM PT RTRN 9FT ADLT (ELECTROSURGICAL) ×2 IMPLANT
EVACUATOR SILICONE 100CC (DRAIN) ×2 IMPLANT
GAUZE SPONGE 4X4 12PLY STRL (GAUZE/BANDAGES/DRESSINGS) ×1 IMPLANT
GLOVE BIOGEL PI IND STRL 8 (GLOVE) ×1 IMPLANT
GLOVE BIOGEL PI INDICATOR 8 (GLOVE) ×1
GLOVE ECLIPSE 7.5 STRL STRAW (GLOVE) ×3 IMPLANT
GOWN STRL REUS W/ TWL LRG LVL3 (GOWN DISPOSABLE) ×1 IMPLANT
GOWN STRL REUS W/ TWL XL LVL3 (GOWN DISPOSABLE) ×1 IMPLANT
GOWN STRL REUS W/TWL LRG LVL3 (GOWN DISPOSABLE) ×2
GOWN STRL REUS W/TWL XL LVL3 (GOWN DISPOSABLE) ×4
KIT BASIN OR (CUSTOM PROCEDURE TRAY) ×2 IMPLANT
KIT ROOM TURNOVER OR (KITS) ×2 IMPLANT
LIQUID BAND (GAUZE/BANDAGES/DRESSINGS) ×2 IMPLANT
NDL 18GX1X1/2 (RX/OR ONLY) (NEEDLE) ×1 IMPLANT
NDL BLUNT 16X1.5 OR ONLY (NEEDLE) IMPLANT
NDL HYPO 25GX1X1/2 BEV (NEEDLE) ×1 IMPLANT
NEEDLE 18GX1X1/2 (RX/OR ONLY) (NEEDLE) ×2 IMPLANT
NEEDLE BLUNT 16X1.5 OR ONLY (NEEDLE) IMPLANT
NEEDLE HYPO 25GX1X1/2 BEV (NEEDLE) ×2 IMPLANT
NS IRRIG 1000ML POUR BTL (IV SOLUTION) ×2 IMPLANT
PACK GENERAL/GYN (CUSTOM PROCEDURE TRAY) ×2 IMPLANT
PAD ARMBOARD 7.5X6 YLW CONV (MISCELLANEOUS) ×2 IMPLANT
PLASMABLADE 3.0S (MISCELLANEOUS) ×2
SPECIMEN JAR X LARGE (MISCELLANEOUS) ×2 IMPLANT
SPONGE LAP 18X18 X RAY DECT (DISPOSABLE) ×2 IMPLANT
SUT ETHILON 2 0 FS 18 (SUTURE) ×2 IMPLANT
SUT ETHILON 4 0 PS 2 18 (SUTURE) IMPLANT
SUT MNCRL AB 4-0 PS2 18 (SUTURE) ×4 IMPLANT
SUT VIC AB 3-0 54X BRD REEL (SUTURE) ×1 IMPLANT
SUT VIC AB 3-0 BRD 54 (SUTURE) ×4
SUT VIC AB 3-0 SH 18 (SUTURE) ×4 IMPLANT
SYR CONTROL 10ML LL (SYRINGE) ×2 IMPLANT
TAPE STRIPS DRAPE STRL (GAUZE/BANDAGES/DRESSINGS) ×1 IMPLANT
TOWEL OR 17X24 6PK STRL BLUE (TOWEL DISPOSABLE) ×2 IMPLANT
TOWEL OR 17X26 10 PK STRL BLUE (TOWEL DISPOSABLE) ×2 IMPLANT
TUBE CONNECTING 12X1/4 (SUCTIONS) ×2 IMPLANT

## 2016-05-27 NOTE — Op Note (Signed)
Preoperative Diagnosis: RIGHT BREAST CANCER  Postoprative Diagnosis: RIGHT BREAST CANCER  Procedure: Procedure(s): RIGHT TOTAL MASTECTOMY WITH AXILLARY SENTINEL LYMPH NODE BIOPSY   Surgeon: Excell Seltzer T   Assistants: None  Anesthesia:  General LMA anesthesia  Indications: Patient is a 50 year old female with recently diagnosed multicentric stage I right breast cancer. After extensive consultation regarding initial surgical treatment options she has elected to proceed with right total mastectomy with sentinel lymph node biopsy. The surgery and alternatives and risks have been discussed extensively and detailed elsewhere.    Procedure Detail:  Preoperatively the patient underwent injection of 1 mCi of technetium sulfur colloid intradermally around the right nipple. She was taken to the operating room, placed in the supine position on the operating table, and general anesthesia induced. She was carefully positioned with her right arm extended. The entire right anterior chest axilla and upper arm were widely sterilely prepped and draped. Patient timeout was performed and correct procedure verified. She received preoperative IV antibiotics. PAS port in place. A transverse elliptical incision was planned encompassing the nipple areolar complex. The incision was made with the plasma blade. Skin and subcutaneous tenderness flaps were then raised superiorly toward the clavicle, medially to the edge of the sternum, inferiorly to the inframammary crease and laterally out to the anterior border of latissimus dorsi which was defined and dissected. The breast was then reflected up off the chest wall working medial to lateral dividing attachments to the pectoralis major and serratus. As the dissection progressed laterally the specimen was freed from the edge of the trellis major and the clavipectoral fascia was incised at the lateral border of the pectoralis minor. The specimen was dissected anteriorly  away from the anterior border of the latissimus dorsi working up to the axilla. The neoprobe was then used to localize several hot areas in the axilla and with careful blunt dissection and with the plasma blade 3 sentinel nodes were removed with counts ranging from 140o to 200. Background in the axilla at this point was negligible. There was no palpable abnormal adenopathy.  I then came across the low axilla with Kelly clamps and the specimen was removed, oriented with sutures and sent for permanent pathology. The wound was thoroughly irrigated and complete hemostasis obtained. A 19 Blake closed suction drain was left beneath both flaps and brought out through a lateral stab wound. The subcutaneous tissue was closed with interrupted 3-0 Vicryl and the skin with a running subcuticular 4-0 Monocryl and Dermabond. Sponge needle and instrument counts were correct.    Findings: As above  Estimated Blood Loss:  Minimal         Drains: 59 Blake round drain  Blood Given: none          Specimens: #1 right total mastectomy      #2 right axillary sentinel lymph nodes 3        Complications:  * No complications entered in OR log *         Disposition: PACU - hemodynamically stable.         Condition: stable

## 2016-05-27 NOTE — Anesthesia Procedure Notes (Signed)
Procedure Name: LMA Insertion Date/Time: 05/27/2016 12:19 PM Performed by: Greggory Stallion, Anayansi Rundquist L Pre-anesthesia Checklist: Patient identified, Emergency Drugs available, Suction available and Patient being monitored Patient Re-evaluated:Patient Re-evaluated prior to inductionOxygen Delivery Method: Circle System Utilized Preoxygenation: Pre-oxygenation with 100% oxygen Intubation Type: IV induction Ventilation: Mask ventilation without difficulty LMA: LMA inserted LMA Size: 4.0 Number of attempts: 1 Airway Equipment and Method: Bite block Placement Confirmation: positive ETCO2 Tube secured with: Tape Dental Injury: Teeth and Oropharynx as per pre-operative assessment

## 2016-05-27 NOTE — Transfer of Care (Signed)
Immediate Anesthesia Transfer of Care Note  Patient: Caitlin Monroe  Procedure(s) Performed: Procedure(s): RIGHT TOTAL MASTECTOMY WITH AXILLARY SENTINEL LYMPH NODE BIOPSY (Right)  Patient Location: PACU  Anesthesia Type:General  Level of Consciousness: awake, alert , oriented and patient cooperative  Airway & Oxygen Therapy: Patient Spontanous Breathing and Patient connected to nasal cannula oxygen  Post-op Assessment: Report given to RN, Post -op Vital signs reviewed and stable and Patient moving all extremities  Post vital signs: Reviewed and stable  Last Vitals:  Vitals:   05/27/16 1417 05/27/16 1420  BP: 130/74   Pulse:    Resp:    Temp:  36.7 C    Last Pain:  Vitals:   05/27/16 0918  TempSrc: Oral         Complications: No apparent anesthesia complications

## 2016-05-27 NOTE — Anesthesia Procedure Notes (Signed)
Anesthesia Regional Block:  Pectoralis block  Pre-Anesthetic Checklist: ,, timeout performed, Correct Patient, Correct Site, Correct Laterality, Correct Procedure, Correct Position, site marked, Risks and benefits discussed,  Surgical consent,  Pre-op evaluation,  At surgeon's request and post-op pain management  Laterality: Right  Prep: chloraprep       Needles:  Injection technique: Single-shot  Needle Type: Echogenic Stimulator Needle          Additional Needles:  Procedures: ultrasound guided (picture in chart) Pectoralis block Narrative:  Injection made incrementally with aspirations every 5 mL.  Performed by: Personally  Anesthesiologist: Addasyn Mcbreen  Additional Notes: H+P and labs reviewed, risks and benefits discussed with patient, procedure tolerated well without complications      

## 2016-05-27 NOTE — Interval H&P Note (Signed)
History and Physical Interval Note:  05/27/2016 11:21 AM  Caitlin Monroe  has presented today for surgery, with the diagnosis of RIGHT BREAST CANCER  The various methods of treatment have been discussed with the patient and family. After consideration of risks, benefits and other options for treatment, the patient has consented to  Procedure(s): RIGHT TOTAL MASTECTOMY WITH AXILLARY SENTINEL LYMPH NODE BIOPSY (Right) as a surgical intervention .  The patient's history has been reviewed, patient examined, no change in status, stable for surgery.  I have reviewed the patient's chart and labs.  Questions were answered to the patient's satisfaction.     [name]

## 2016-05-27 NOTE — H&P (Signed)
History of Present Illness Caitlin Monroe [name] Monroe; 05/15/2016 12:42 PM) Patient words: Discuss with Caitlin Monroe.  The patient is a 50 year old female who presents with breast cancer. She returns for further treatment planning for newly diagnosed multicentric cancer of the right breast. Her original presentation was as below.  She is a peri menopausal female referred by Caitlin. Christene Monroe for evaluation of recently diagnosed carcinoma of the right breast. She recently noted a lump in her right breast. Subsequent imaging included diagnostic mamogram showing extremely dense breast tissue and a rounded mass in the right breast corresponding to known cyst, and ultrasound showing a 1.6cm lobulated mass at 9 o'clock posteriorly in the right breast and a 16m oval mass in the right breast 10 o'clock middle depth. An ultrasound guided breast biopsy was performed on both masses with pathology revealing lobular carcinoma of the breast in both. [facility]. She has experienced a breast lump as above. She does not have a personal history of any previous breast problems.  Findings at that time were the following: Tumor size: 1.6 and 0.6 cm Tumor grade: I-II Estrogen Receptor: Pos Progesterone Receptor: Pos Her-2 neu: Neg Lymph node status: Neg  She subsequently has had a bilateral breast MRI which showed 2 new suspicious areas, a proximally 1 cm mass in the medial breast and a large up to 9 cm area of abnormal enhancement in the upper inner breast. We have previously reviewed those findings. She has seen Caitlin Monroe learn about reconstruction options.  Past Medical History:  Diagnosis Date  . Allergy   . Anxiety   . Breast cancer of upper-outer quadrant of right female breast ([place] 04/25/2016  . Depression   . Family history of breast cancer   . Family history of prostate cancer   . Hypertension   . PONV (postoperative nausea and vomiting)    Past  Surgical History:  Procedure Laterality Date  . ABDOMINAL HYSTERECTOMY  Aug 2011   [employer] TAH for fibroids  . FOOT SURGERY Right   . TONSILLECTOMY    . WISDOM TOOTH EXTRACTION       Allergies ([place]aitlin [place] 05/15/2016 12:14 PM) No Known Drug Allergies09/21/2017  Medication History (Caitlin Monroe Caitlin Monroe; 05/15/2016 12:15 PM) Metoprolol Tartrate (Oral) Specific dose unknown - Active. No Current Medications (Taken starting 05/15/2016) BuPROPion HCl (100MG Tablet, Oral) Active. Sertraline HCl (100MG Tablet, Oral) Active. Medications Reconciled  Vitals (Caitlin BertholdCMA; 05/15/2016 12:15 PM) 05/15/2016 12:15 PM Weight: 189.2 lb Height: 69in Body Surface Area: 2.02 m Body Mass Index: 27.94 kg/m  Temp.: 98.18F  Pulse: 68 (Regular)  BP: 110/80 (Sitting, Left Arm, Standard)       Physical Exam (Caitlin KitchenT. Sumayah Bearse Monroe; 05/15/2016 12:43 PM) The physical exam findings are as follows: Note:Breast exam was repeated today. Again noted is a palpable mass in the lateral right breast. She has C to D cup slightly ptotic breasts. Clavicle to inframammary crease distance was measured at 37 cm.    Assessment & Plan (Caitlin KitchenT. Shalan Neault Monroe; 05/15/2016 12:45 PM) MALIGNANT NEOPLASM OF RIGHT BREAST, STAGE 1, ESTROGEN RECEPTOR POSITIVE (C50.911) Impression: 50year old female with a new diagnosis of cancer of the right breast, multi-centric lobular,, 1.6 and 0.6 cm on ultrasound separated by 6.7 cm. Clinical stage 1 a, ER positive, PR positive, HER-2 negative. She now has an MRI showing 2 other suspicious areas in the right breast. She is leaning toward mastectomy  and had a number of pertinent questions regarding pros and cons of lumpectomy versus mastectomy and we went over all those in detail. We discussed that she was not an ideal candidate for lumpectomy originally had with these new MRI findings I would tend to lean toward a mastectomy although if we had negative biopsies of  the 2 new areas she would still be a candidate for breast conservation. She asked a number of pertinent questions and all were answered. She will think this over and call me back in the next few days. Current Plans After consideration she has decided to proceed with right total mastectectomy and axillary SLN Bx without reconstruction.

## 2016-05-28 ENCOUNTER — Telehealth: Payer: Self-pay | Admitting: Hematology

## 2016-05-28 ENCOUNTER — Encounter (HOSPITAL_COMMUNITY): Payer: Self-pay | Admitting: General Surgery

## 2016-05-28 DIAGNOSIS — C50411 Malignant neoplasm of upper-outer quadrant of right female breast: Secondary | ICD-10-CM | POA: Diagnosis not present

## 2016-05-28 LAB — CBC
HCT: 34.3 % — ABNORMAL LOW (ref 36.0–46.0)
Hemoglobin: 11.3 g/dL — ABNORMAL LOW (ref 12.0–15.0)
MCH: 31.8 pg (ref 26.0–34.0)
MCHC: 32.9 g/dL (ref 30.0–36.0)
MCV: 96.6 fL (ref 78.0–100.0)
PLATELETS: 245 10*3/uL (ref 150–400)
RBC: 3.55 MIL/uL — ABNORMAL LOW (ref 3.87–5.11)
RDW: 12.6 % (ref 11.5–15.5)
WBC: 11.9 10*3/uL — AB (ref 4.0–10.5)

## 2016-05-28 MED ORDER — OXYCODONE-ACETAMINOPHEN 5-325 MG PO TABS: 1.0000 | ORAL_TABLET | ORAL | 0 refills | Status: DC | PRN

## 2016-05-28 NOTE — Telephone Encounter (Signed)
Spoke with pt to confirm 10/26 appt per LOS. Time per pt 10/4

## 2016-05-28 NOTE — Progress Notes (Signed)
S: Slept well, pain well controlled, great mood this morning  Vitals, labs, intake/output, and orders reviewed at this time.  Gen: A&Ox3, no distress  H&N: EOMI, atraumatic, neck supple Chest: unlabored respirations, RRR Breast: R mastectomy incision c/d/i, no appreciable fluid under flaps, no erythema or induration, minimal tenderness. JP drain output ss- 115cc since OR Abd: soft, nontender, nondistended Ext: warm, no edema Neuro: grossly normal  Lines/tubes/drains: PIV, JP  A/P:  POD 1 s/p R mastectomy and SLN bx, doing well. Discharge home today.   Romana Juniper, MD [facility] [place] Pager [phone]

## 2016-05-28 NOTE — Discharge Instructions (Signed)
[facility] [place] [phone]  MASTECTOMY: POST OP INSTRUCTIONS  Always review your discharge instruction sheet given to you by the facility where your surgery was performed. IF YOU HAVE DISABILITY OR FAMILY LEAVE FORMS, YOU MUST BRING THEM TO THE OFFICE FOR PROCESSING.   DO NOT GIVE THEM TO YOUR DOCTOR. A prescription for pain medication may be given to you upon discharge.  Take your pain medication as prescribed, if needed.  If narcotic pain medicine is not needed, then you may take acetaminophen (Tylenol) or ibuprofen (Advil) as needed.  1. Take your usually prescribed medications unless otherwise directed. 2. If you need a refill on your pain medication, please contact your pharmacy.  They will contact our office to request authorization.  Prescriptions will not be filled after 5pm or on week-ends. 3. You should follow a light diet the first few days after arrival home, such as soup and crackers, etc.  Resume your normal diet the day after surgery. 4. Most patients will experience some swelling and bruising on the chest and underarm.  Ice packs will help.  Swelling and bruising can take several days to resolve.  5. It is common to experience some constipation if taking pain medication after surgery.  Increasing fluid intake and taking a stool softener (such as Colace) will usually help or prevent this problem from occurring.  A mild laxative (Milk of Magnesia or Miralax) should be taken according to package instructions if there are no bowel movements after 48 hours. 6. Unless discharge instructions indicate otherwise, leave your bandage dry and in place until your next appointment in 3-5 days.  You may take a limited sponge bath.  No tub baths or showers until the drains are removed.  You may have steri-strips (small skin tapes) in place directly over the incision.  These strips should be left on the skin for 7-10 days.  If your surgeon used skin glue on the incision, you may  shower in 24 hours.  The glue will flake off over the next 2-3 weeks.  Any sutures or staples will be removed at the office during your follow-up visit. 7. DRAINS:  If you have drains in place, it is important to keep a list of the amount of drainage produced each day in your drains.  Before leaving the hospital, you should be instructed on drain care.  Call our office if you have any questions about your drains. 8. ACTIVITIES:  You may resume regular (light) daily activities beginning the next day--such as daily self-care, walking, climbing stairs--gradually increasing activities as tolerated.  You may have sexual intercourse when it is comfortable.  Refrain from any heavy lifting or straining until approved by your doctor. a. You may drive when you are no longer taking prescription pain medication, you can comfortably wear a seatbelt, and you can safely maneuver your car and apply brakes. b. RETURN TO WORK:  _3-4 weeks_________________________________________________________ 9. You should see your doctor in the office for a follow-up appointment approximately 3-5 days after your surgery.  Your doctors nurse will typically make your follow-up appointment when she calls you with your pathology report.  Expect your pathology report 2-3 business days after your surgery.  You may call to check if you do not hear from us after three days.   10. OTHER INSTRUCTIONS: ______________________________________________________________________________________________ ____________________________________________________________________________________________ WHEN TO CALL YOUR DOCTOR: 1. Fever over 101.0 2. Nausea and/or vomiting 3. Extreme swelling or bruising 4. Continued bleeding from incision. 5. Increased pain, redness, or drainage from  the incision. The clinic staff is available to answer your questions during regular business hours.  Please dont hesitate to call and ask to speak to one of the nurses for  clinical concerns.  If you have a medical emergency, go to the nearest emergency room or call 911.  A surgeon from [facility] is always on call at the hospital. [address] [place], [place], [place]  [address] ? [address], [place], [place]   [address] [phone] ? [phone] ? FAX [phone] Web site: [email or web address]

## 2016-05-28 NOTE — Discharge Summary (Signed)
Physician Discharge Summary  Patient ID: Caitlin Monroe MRN: [id number] DOB/AGE: April 13, 1966 50 y.o.  Admit date: 05/27/2016 Discharge date: 05/28/2016  Admission Diagnoses:  Discharge Diagnoses:  Active Problems:   Breast cancer, right [facility])   Discharged Condition: good  Hospital Course: She was admitted for observation following right mastectomy with sentinel node biopsy and did very well. Her pain was controlled with oral medications and she was able to tolerate a diet.   Consults: None  Significant Diagnostic Studies: n/a  Treatments: IV hydration, analgesia: percocet and surgery: right mastectomy with sentinel lymph node biopsy  Discharge Exam: Blood pressure (!) 96/58, pulse 66, temperature 98.3 F (36.8 C), temperature source Oral, resp. rate 17, height 5\' 8"  (1.727 m), weight 84.8 kg (187 lb), SpO2 97 %. General appearance: alert and cooperative Resp: clear to auscultation bilaterally Chest wall: no tenderness Cardio: regular rate and rhythm GI: soft, non-tender; bowel sounds normal; no masses,  no organomegaly Extremities: extremities normal, atraumatic, no cyanosis or edema Pulses: 2+ and symmetric Skin: Skin color, texture, turgor normal. No rashes or lesions Neurologic: Grossly normal Incision/Wound: c/d/i, JP output serosanguinous  Disposition:   Discharge Instructions    Increase activity slowly    Complete by:  As directed        Medication List    TAKE these medications   buPROPion 100 MG 12 hr tablet Commonly known as:  WELLBUTRIN SR Take 1 tablet (100 mg total) by mouth daily.   cholecalciferol 1000 units tablet Commonly known as:  VITAMIN D Take 1,000 Units by mouth daily.   COD LIVER OIL PO Take 1 capsule by mouth daily.   FISH OIL PO Take 1 capsule by mouth daily.   ibuprofen 200 MG tablet Commonly known as:  ADVIL,MOTRIN Take 200 mg by mouth every 6 (six) hours as needed for mild pain.   Melatonin 1 MG Tabs Take 1 mg by mouth  at bedtime as needed (sleep).   metoprolol tartrate 25 MG tablet Commonly known as:  LOPRESSOR Take 0.5 tablets (12.5 mg total) by mouth 2 (two) times daily. May increase after 1 week to 1 tab po BID if blood pressure elevated. What changed:  how much to take  additional instructions   oxyCODONE-acetaminophen 5-325 MG tablet Commonly known as:  PERCOCET/ROXICET Take 1-2 tablets by mouth every 4 (four) hours as needed for moderate pain.   sertraline 100 MG tablet Commonly known as:  ZOLOFT Take 150 mg by mouth daily.      Follow-up Information    Edward Jolly, MD .   Specialty:  General Surgery Why:  As scheduled Contact information: [place]  [address] [phone]           Signed: Clovis Riley 05/28/2016, 8:29 AM

## 2016-05-31 NOTE — Anesthesia Preprocedure Evaluation (Signed)
Anesthesia Evaluation  Patient identified by MRN, date of birth, ID band Patient awake    Reviewed: Allergy & Precautions, NPO status , Patient's Chart, lab work & pertinent test results, reviewed documented beta blocker date and time   History of Anesthesia Complications (+) PONV and history of anesthetic complications  Airway Mallampati: I  TM Distance: >3 FB Neck ROM: Full    Dental  (+) Teeth Intact, Dental Advisory Given   Pulmonary former smoker,    breath sounds clear to auscultation       Cardiovascular hypertension, Pt. on home beta blockers  Rhythm:Regular     Neuro/Psych PSYCHIATRIC DISORDERS Anxiety Depression negative neurological ROS     GI/Hepatic negative GI ROS, Neg liver ROS,   Endo/Other  negative endocrine ROS  Renal/GU negative Renal ROS     Musculoskeletal   Abdominal   Peds  Hematology negative hematology ROS (+)   Anesthesia Other Findings   Reproductive/Obstetrics                             Anesthesia Physical Anesthesia Plan  ASA: II  Anesthesia Plan: General and Regional   Post-op Pain Management:    Induction: Intravenous  Airway Management Planned: LMA and Oral ETT  Additional Equipment: None  Intra-op Plan:   Post-operative Plan: Extubation in OR  Informed Consent: I have reviewed the patients History and Physical, chart, labs and discussed the procedure including the risks, benefits and alternatives for the proposed anesthesia with the patient or authorized representative who has indicated his/her understanding and acceptance.   Dental advisory given  Plan Discussed with: CRNA and Surgeon  Anesthesia Plan Comments:         Anesthesia Quick Evaluation

## 2016-05-31 NOTE — Anesthesia Postprocedure Evaluation (Signed)
Anesthesia Post Note  Patient: Caitlin Monroe  Procedure(s) Performed: Procedure(s) (LRB): RIGHT TOTAL MASTECTOMY WITH AXILLARY SENTINEL LYMPH NODE BIOPSY (Right)  Patient location during evaluation: PACU Anesthesia Type: General and Regional Level of consciousness: awake Pain management: pain level controlled Vital Signs Assessment: post-procedure vital signs reviewed and stable Respiratory status: spontaneous breathing Cardiovascular status: stable Postop Assessment: no signs of nausea or vomiting Anesthetic complications: no    Last Vitals:  Vitals:   05/27/16 2044 05/28/16 0530  BP: 96/66 (!) 96/58  Pulse: 83 66  Resp: 17 17  Temp: 36.8 C 36.8 C    Last Pain:  Vitals:   05/28/16 0941  TempSrc:   PainSc: 0-No pain                 Marline Morace

## 2016-06-02 ENCOUNTER — Telehealth: Payer: Self-pay | Admitting: *Deleted

## 2016-06-02 NOTE — Telephone Encounter (Signed)
Ordered oncotype per Dr. Feng.  Faxed requisition to pathology and confirmed receipt.  

## 2016-06-03 ENCOUNTER — Telehealth: Payer: Self-pay | Admitting: *Deleted

## 2016-06-03 NOTE — Telephone Encounter (Signed)
[facility] [employer] representative called to verify insurance on patient. Returned phone call to [phone] but couldn't leave a message.

## 2016-06-12 ENCOUNTER — Telehealth: Payer: Self-pay | Admitting: *Deleted

## 2016-06-12 ENCOUNTER — Encounter (HOSPITAL_COMMUNITY): Payer: Self-pay

## 2016-06-12 NOTE — Telephone Encounter (Signed)
Received Oncotype Score of 18/12%. Called pt with results and informed she did not need chemotherapy. Confirmed f/u appt with Dr. Burr Medico on 06/19/16

## 2016-06-19 ENCOUNTER — Telehealth: Payer: Self-pay | Admitting: Hematology

## 2016-06-19 ENCOUNTER — Ambulatory Visit (HOSPITAL_BASED_OUTPATIENT_CLINIC_OR_DEPARTMENT_OTHER): Payer: PRIVATE HEALTH INSURANCE

## 2016-06-19 ENCOUNTER — Ambulatory Visit (HOSPITAL_BASED_OUTPATIENT_CLINIC_OR_DEPARTMENT_OTHER): Payer: PRIVATE HEALTH INSURANCE | Admitting: Hematology

## 2016-06-19 VITALS — BP 133/92 | HR 81 | Temp 98.4°F | Resp 18 | Ht 68.0 in | Wt 188.5 lb

## 2016-06-19 DIAGNOSIS — Z170 Estrogen receptor positive status [ER+]: Secondary | ICD-10-CM | POA: Diagnosis not present

## 2016-06-19 DIAGNOSIS — F329 Major depressive disorder, single episode, unspecified: Secondary | ICD-10-CM

## 2016-06-19 DIAGNOSIS — R21 Rash and other nonspecific skin eruption: Secondary | ICD-10-CM

## 2016-06-19 DIAGNOSIS — C50411 Malignant neoplasm of upper-outer quadrant of right female breast: Secondary | ICD-10-CM

## 2016-06-19 DIAGNOSIS — I10 Essential (primary) hypertension: Secondary | ICD-10-CM

## 2016-06-19 LAB — CBC WITH DIFFERENTIAL/PLATELET
BASO%: 0.1 % (ref 0.0–2.0)
Basophils Absolute: 0 10*3/uL (ref 0.0–0.1)
EOS ABS: 0.3 10*3/uL (ref 0.0–0.5)
EOS%: 2.5 % (ref 0.0–7.0)
HEMATOCRIT: 38.6 % (ref 34.8–46.6)
HGB: 13.4 g/dL (ref 11.6–15.9)
LYMPH%: 19.2 % (ref 14.0–49.7)
MCH: 32.1 pg (ref 25.1–34.0)
MCHC: 34.7 g/dL (ref 31.5–36.0)
MCV: 92.6 fL (ref 79.5–101.0)
MONO#: 0.5 10*3/uL (ref 0.1–0.9)
MONO%: 4.7 % (ref 0.0–14.0)
NEUT#: 7.3 10*3/uL — ABNORMAL HIGH (ref 1.5–6.5)
NEUT%: 73.5 % (ref 38.4–76.8)
PLATELETS: 333 10*3/uL (ref 145–400)
RBC: 4.17 10*6/uL (ref 3.70–5.45)
RDW: 12.4 % (ref 11.2–14.5)
WBC: 9.9 10*3/uL (ref 3.9–10.3)
lymph#: 1.9 10*3/uL (ref 0.9–3.3)

## 2016-06-19 LAB — COMPREHENSIVE METABOLIC PANEL
ALT: 16 U/L (ref 0–55)
ANION GAP: 7 meq/L (ref 3–11)
AST: 18 U/L (ref 5–34)
Albumin: 4.1 g/dL (ref 3.5–5.0)
Alkaline Phosphatase: 60 U/L (ref 40–150)
BILIRUBIN TOTAL: 0.56 mg/dL (ref 0.20–1.20)
BUN: 11.5 mg/dL (ref 7.0–26.0)
CALCIUM: 9.5 mg/dL (ref 8.4–10.4)
CHLORIDE: 107 meq/L (ref 98–109)
CO2: 25 meq/L (ref 22–29)
Creatinine: 0.8 mg/dL (ref 0.6–1.1)
EGFR: 90 mL/min/{1.73_m2} — AB (ref >90)
Glucose: 95 mg/dl (ref 70–140)
Potassium: 4.2 mEq/L (ref 3.5–5.1)
Sodium: 140 mEq/L (ref 136–145)
TOTAL PROTEIN: 7.4 g/dL (ref 6.4–8.3)

## 2016-06-19 NOTE — Progress Notes (Signed)
[place]  Telephone:(336) [phone] Fax:([phone]) [phone]  Clinic Follow Up Note   Patient Care Team: [name], MD as PCP - General (Family Medicine) Brien Few, MD as Consulting Physician (Obstetrics and Gynecology) Artelia Laroche, CNM as Midwife (Obstetrics and Gynecology) Excell Seltzer, MD as Consulting Physician (General Surgery) [name], MD as Consulting Physician (Hematology) Gery Pray, MD as Consulting Physician (Radiation Oncology) 06/19/2016  CHIEF COMPLAINT:  Follow up right breast cancer  Oncology History   Breast cancer of upper-outer quadrant of right female breast [facility])   Staging form: Breast, AJCC 7th Edition   - Clinical stage from 04/23/2016: Stage IA (T1c(m), N0, M0) - Signed by [name], MD on 04/30/2016   - Pathologic stage from 05/27/2016: Stage IIA (T2(m), N0, cM0) - Unsigned      Breast cancer of upper-outer quadrant of right female breast ([place])   04/17/2016 Mammogram    Mammogram and US showed a 2.2cm cyst at 12:00, a 1.6cm lobulated mass at 9:00 and a 77m mass at 10:00, axilla was negative       04/23/2016 Initial Diagnosis    [place]      04/23/2016 Initial Biopsy    Core needle biopsy of left breast masses at 10:00 and 9:30 positions both showed invasive and insitu mammary carcinoma       04/23/2016 Receptors her2    ER 90%+, PR 80-90%+, HER2-, KI67 10-15%      05/27/2016 Surgery    Right breast mastectomy and sentinel lymph node biopsy      05/27/2016 Pathology Results    Left breast mastectomy showed multifocal invasive and in situ lobular carcinoma, 3.2, 0.9 and 0.9 cm, G1 margins were negative, 3 sentinel lymph nodes were negative.       05/27/2016 Oncotype testing    RS 18, which predicts 10 year distant recurrence risk of 12% with tamoxifen        HISTORY OF PRESENTING ILLNESS (04/30/2016):  AMax [id number].o. female is here because of her  recently diagnosed right breast cancer. She is accompanied by her friend and sister to our multidisciplinary breast clinic today.  She failed a breast lump about a few weeks ago. She denies any breast pain, skin change or nipple discharge. She had normal screening mammogram in April 2017. She was seen by her primary care physician for the breast mass, and underwent diagnostic mammogram and ultrasound on 04/17/2016, which showed a 2.2 cm cyst at 12:00 position, a 1.6 cm lobulated mass at 9:00 and a 6 mm mass at 10:00 position, ultrasound of the axilla was negative. She underwent ultrasound guided core needle biopsy of both right breast mass, which all showed invasive lobular carcinoma, ER/PR strongly positive, HER-2 negative.  She feels very well overall, denies any pain or other symptoms. She is physically active, has been quite anxious since her cancer diagnosis.  GYN HISTORY  Menarchal: 12 LMP: 89(hysrectomy)  Contraceptive: 10, stopped in 30's  HRT: none  G1P1: one daughter, 171yo   INTERIM HISTORY:  AMauriciareturns for follow-up. She underwent a right mastectomy and sentinel lymph node biopsy on 05/27/2016. She tolerated surgery well, and has recovered well. She apparently had allergy reaction to dermabound, and developed diffuse skin rash on the right chest wall, with itchiness. She was seen by her surgeon Dr. HExcell Seltzerand has been using Benadryl. She otherwise feels well, denies any significant pain or other symptoms. Her  appetite and energy level has recovered well.   MEDICAL HISTORY:  Past Medical History:  Diagnosis Date  . Anxiety   . Breast cancer of upper-outer quadrant of right female breast ([place]) 04/25/2016  . Depression   . Family history of breast cancer   . Family history of prostate cancer   . Hypertension    "because of stress" (05/27/2016)  . PONV (postoperative nausea and vomiting)   . Seasonal allergies     SURGICAL HISTORY: Past Surgical History:  Procedure  Laterality Date  . ABDOMINAL HYSTERECTOMY  Aug 2011   [employer] TAH for fibroids  . BREAST BIOPSY  03/2016  . FOOT NEUROMA SURGERY Right 2000?  Marland Kitchen MASTECTOMY COMPLETE / SIMPLE W/ SENTINEL NODE BIOPSY Right 05/27/2016  . MASTECTOMY W/ SENTINEL NODE BIOPSY Right 05/27/2016   Procedure: RIGHT TOTAL MASTECTOMY WITH AXILLARY SENTINEL LYMPH NODE BIOPSY;  Surgeon: Excell Seltzer, MD;  Location: [place];  Service: General;  Laterality: Right;  . TONSILLECTOMY    . WISDOM TOOTH EXTRACTION  ~ 1985    SOCIAL HISTORY: Social History   Social History  . Marital status: Legally Separated    Spouse name: N/A  . Number of children: N/A  . Years of education: N/A   Occupational History  . Law Student    Social History Main Topics  . Smoking status: Former Smoker    Packs/day: 0.50    Years: 15.00    Types: Cigarettes    Quit date: 06/23/1995  . Smokeless tobacco: Never Used  . Alcohol use No     Comment: 05/27/2016 "nothing since 1996"  . Drug use: No     Comment: 05/27/2016 "nothing since 1996"  . Sexual activity: Yes   Other Topics Concern  . Not on file   Social History Narrative   Married. Education: [employer]. Exercise: Gym 2 times a week fit walking.                         FAMILY HISTORY: Family History  Problem Relation Age of Onset  . Hypertension Mother   . Depression Mother   . Hyperlipidemia Mother   . Heart disease Mother   . Prostate cancer Father 70  . Mental illness Sister   . Hypertension Sister   . Prostate cancer Brother     dx in his late 37s  . Mental illness Maternal Grandmother   . Breast cancer Maternal Grandmother     dx in her 72s  . Lung cancer Maternal Grandmother     smoker since age 103; dx in her 65s  . Cancer Maternal Grandfather   . Heart disease Maternal Grandfather   . Mental illness Maternal Grandfather   . Lung cancer Paternal Grandmother     dx in her 69s, life long smoker  . Prostate cancer Paternal Grandfather   . Allergies  Daughter   . Anxiety disorder Daughter   . Melanoma Brother 16  . Bladder Cancer Sister 91    ALLERGIES:  is allergic to other.  MEDICATIONS:  Current Outpatient Prescriptions  Medication Sig Dispense Refill  . buPROPion (WELLBUTRIN SR) 100 MG 12 hr tablet Take 1 tablet (100 mg total) by mouth daily. 90 tablet 3  . cholecalciferol (VITAMIN D) 1000 UNITS tablet Take 2,000 Units by mouth daily.     . COD LIVER OIL PO Take 1 capsule by mouth daily.    Marland Kitchen ibuprofen (ADVIL,MOTRIN) 200 MG tablet Take 200 mg by mouth  every 6 (six) hours as needed for mild pain.    . Melatonin 1 MG TABS Take 1 mg by mouth at bedtime as needed (sleep).    . metoprolol tartrate (LOPRESSOR) 25 MG tablet Take 0.5 tablets (12.5 mg total) by mouth 2 (two) times daily. May increase after 1 week to 1 tab po BID if blood pressure elevated. (Patient taking differently: Take 25 mg by mouth 2 (two) times daily. May increase after 1 week to 1 tab po BID if blood pressure elevated.) 60 tablet 1  . sertraline (ZOLOFT) 100 MG tablet Take 150 mg by mouth daily.    . Omega-3 Fatty Acids (FISH OIL PO) Take 1 capsule by mouth daily.      No current facility-administered medications for this visit.     REVIEW OF SYSTEMS:   Constitutional: Denies fevers, chills or abnormal night sweats Eyes: Denies blurriness of vision, double vision or watery eyes Ears, nose, mouth, throat, and face: Denies mucositis or sore throat Respiratory: Denies cough, dyspnea or wheezes Cardiovascular: Denies palpitation, chest discomfort or lower extremity swelling Gastrointestinal:  Denies nausea, heartburn or change in bowel habits Skin: Denies abnormal skin rashes Lymphatics: Denies new lymphadenopathy or easy bruising Neurological:Denies numbness, tingling or new weaknesses Behavioral/Psych: Mood is stable, no new changes  All other systems were reviewed with the patient and are negative.  PHYSICAL EXAMINATION: ECOG PERFORMANCE STATUS: 0 -  Asymptomatic  Vitals:   06/19/16 1110  BP: (!) 133/92  Pulse: 81  Resp: 18  Temp: 98.4 F (36.9 C)   Filed Weights   06/19/16 1110  Weight: 188 lb 8 oz (85.5 kg)    GENERAL:alert, no distress and comfortable SKIN: skin color, texture, turgor are normal, no rashes or significant lesions EYES: normal, conjunctiva are pink and non-injected, sclera clear OROPHARYNX:no exudate, no erythema and lips, buccal mucosa, and tongue normal  NECK: supple, thyroid normal size, non-tender, without nodularity LYMPH:  no palpable lymphadenopathy in the cervical, axillary or inguinal LUNGS: clear to auscultation and percussion with normal breathing effort HEART: regular rate & rhythm and no murmurs and no lower extremity edema ABDOMEN:abdomen soft, non-tender and normal bowel sounds Musculoskeletal:no cyanosis of digits and no clubbing  PSYCH: alert & oriented x 3 with fluent speech NEURO: no focal motor/sensory deficits Breasts: Breast inspection showed them to be symmetrical with no nipple discharge.  right breast is surgically absent, the incision is healing well, no discharge or skin redness. There are diffuse skin rash on her right side chest wall , no blister or ulcers.  Palpation of the left beast and axilla revealed no obvious mass that I could appreciate.  LABORATORY DATA:  I have reviewed the data as listed CBC Latest Ref Rng & Units 06/19/2016 05/28/2016 05/23/2016  WBC 3.9 - 10.3 10e3/uL 9.9 11.9(H) 8.4  Hemoglobin 11.6 - 15.9 g/dL 13.4 11.3(L) 13.5  Hematocrit 34.8 - 46.6 % 38.6 34.3(L) 40.8  Platelets 145 - 400 10e3/uL 333 245 337   CMP Latest Ref Rng & Units 06/19/2016 05/23/2016 04/30/2016  Glucose 70 - 140 mg/dl 95 90 95  BUN 7.0 - 26.0 mg/dL 11.5 16 11.8  Creatinine 0.6 - 1.1 mg/dL 0.8 0.88 0.8  Sodium 136 - 145 mEq/L 140 138 141  Potassium 3.5 - 5.1 mEq/L 4.2 4.4 4.6  Chloride 101 - 111 mmol/L - 106 -  CO2 22 - 29 mEq/L '25 25 25  ' Calcium 8.4 - 10.4 mg/dL 9.5 9.3 9.5  Total  Protein 6.4 - 8.3 g/dL  7.4 - 7.4  Total Bilirubin 0.20 - 1.20 mg/dL 0.56 - 0.56  Alkaline Phos 40 - 150 U/L 60 - 48  AST 5 - 34 U/L 18 - 18  ALT 0 - 55 U/L 16 - 18   PATHOLOGY REPORT  Diagnosis 04/23/2016 1. Breast, right, needle core biopsy, 10 o'clock - INVASIVE AND IN SITU MAMMARY CARCINOMA. 2. Breast, right, needle core biopsy, 9:30 o'clock - INVASIVE AND IN SITU MAMMARY CARCINOMA.  Microscopic Comment 1. , 2. E-Cadherin and breast prognostic profile will be performed.  1. Results: IMMUNOHISTOCHEMICAL AND MORPHOMETRIC ANALYSIS PERFORMED MANUALLY Estrogen Receptor: 90%, POSITIVE, STRONG STAINING INTENSITY Progesterone Receptor: 80%, POSITIVE, STRONG STAINING INTENSITY Proliferation Marker Ki67: 10%  Results: HER2 - NEGATIVE RATIO OF HER2/CEP17 SIGNALS 1.60 AVERAGE HER2 COPY NUMBER PER CELL 2.00  2. PROGNOSTIC INDICATORS Results: IMMUNOHISTOCHEMICAL AND MORPHOMETRIC ANALYSIS PERFORMED MANUALLY Estrogen Receptor: 90%, POSITIVE, STRONG STAINING INTENSITY Progesterone Receptor: 90%, POSITIVE, STRONG STAINING INTENSITY Proliferation Marker Ki67: 15%  Results: HER2 - NEGATIVE RATIO OF HER2/CEP17 SIGNALS 1.36 AVERAGE HER2 COPY NUMBER PER CELL 1.70  Diagnosis 05/27/2016 1. Breast, simple mastectomy, Right - MULTIFOCAL INVASIVE AND IN SITU LOBULAR CARCINOMA, 3.2, 0.9, AND 0.9 CM. - MULTIFOCAL LOBULAR CARCINOMA IN SITU. - MARGINS NOT INVOLVED. - FIBROCYSTIC CHANGES WITH PSEUDOANGIOMATOUS STROMAL HYPERPLASIA. - PREVIOUS BIOPSY SITES. 2. Lymph node, sentinel, biopsy, Right axillary #1 - ONE BENIGN LYMPH NODE (0/1). 3. Lymph node, sentinel, biopsy, Right axillary #2 - ONE BENIGN LYMPH NODE (0/1). 4. Lymph node, sentinel, biopsy, Right axillary #3 - ONE BENIGN LYMPH NODE (0/1). Microscopic Comment 1. BREAST, INVASIVE TUMOR, WITH LYMPH NODES PRESENT Specimen, including laterality and lymph node sampling (sentinel, non-sentinel): Right breast and three sentinel lymph  nodes. Procedure: Mastectomy and sentinel lymph node biopsies. Histologic type: Lobular. Grade: 1 Tubule formation: 3 Nuclear pleomorphism: 1 Mitotic:1 Tumor size (gross measurement): 3.2, 0.9, and 0.9 cm Margins: Free of tumor. Invasive, distance to closest margin: 0.9 cm from anterior margin. In-situ, distance to closest margin: 0.9 cm from anterior margin. If margin positive, focally or broadly: N/A Lymphovascular invasion: Not identified. Ductal carcinoma in situ: No. Grade: N/A 1 of 4 FINAL for Weight, Addaline C ([id number]) Microscopic Comment(continued) Extensive intraductal component: N/A Lobular neoplasia: Yes, lobular carcinoma in situ. Tumor focality: Multifocal. Extent of tumor: Skin: Free of tumor Nipple: Free of tumor. Skeletal muscle: Free of tumor. Lymph nodes: Examined: 3 Sentinel 0 Non-sentinel 3 Total Lymph nodes with metastasis: 0 Isolated tumor cells (< 0.2 mm): 0 Micrometastasis: (> 0.2 mm and < 2.0 mm): 0 Macrometastasis: (> 2.0 mm): 0 Extracapsular extension: N/A Breast prognostic profile: Case [id number]. Estrogen receptor: 90%, positive, strong staining. Progesterone receptor: 90%, positive, strong staining. Her 2 neu: Negative, ratio 1.36 and 1.6. Ki-67: 10% and 15%. Non-neoplastic breast: Fibrocystic changes and pseudoangiomatous stromal hyperplasia. TNM: pT2(multifocal), pN0, pMX Comments: There are three nodules of invasive and in situ lobular carcinoma which are 3.2, 0.9, and 0.9 cm. There are also separate foci of lobular carcinoma in situ. The margins of the specimen are not involved by invasive or in situ lobular carcinoma. Immunohistochemistry for cytokeratin AE1/AE3 is performed on on all of the sentinel lymph nodes and no positivity is identified. (JDP:gt, 05/29/16)  Oncotype Dx RS 18, which predicts 10 year distant recurrence risk of 12% with tamoxifen  RADIOGRAPHIC STUDIES: I have personally reviewed the radiological images as  listed and agreed with the findings in the report. Nm Sentinel Node Inj-no Rpt (breast)  Result Date: 05/27/2016 CLINICAL DATA: cancer right breast  Sulfur colloid was injected intradermally by the nuclear medicine technologist for breast cancer sentinel node localization.    ASSESSMENT & PLAN:  50 year old Caucasian female, no significant past medical history, presented with a palpable right breast mass  1. Breast cancer of upper outer quadrant of right breast, invasive lobular carcinoma, mpT2N0M0, stage IIA, ER+/PR+/HER2-, G1, RS 18  ---I discussed her surgical path result in details. -She had multifocal disease, surgical margins were negative. Nodes were negative. -the Oncotype Dx result was reviewed with her in details. She has intermedia risk based on the recurrence score, which predicts 10 year distant recurrence after 5 years of tamoxifen 12%. The benefit of chemotherapy in the intermedia risk group is small and controversial. Given her relatively low score in the intermedia group, I did not recommend adjuvant chemotherapy. She agrees with the plan -Giving the strong ER and PR expression in her tumor, I recommend adjuvant endocrine therapy with tamoxifen or aromatase inhibitor, dependent on her menopause status, to reduce the risk of cancer recurrence. The benefit and risks of antiestrogen therapy were discussed with her, which includes but not limited to, hot flash, blistering, weight gain, skin and vaginal dryness, hyperlipidemia, hyperglycemia, weight gain, risk of thrombosis (tamoxifen ), risk of osteopenia and osteoporosis  (aromatase inhibitor ),etc. she agrees to proceed. Will start in 2 weeks -Given her the size of tumor, and a mastectomy, I do not think she need postmastectomy radiation.  - I will check her [place] and estradiol level, to determine if she is truly postmenopausal.  -We finally discussed the breast cancer surveillance after her surgery. She will continue annual screening  mammogram, self exam, and a routine office visit with lab and exam with [place]. -I encouraged her to have healthy diet and exercise regularly.   2. Bone health -If she is post menopause, I'll obtain a bone density scan -We discussed the effect on bone density from tamoxifen or aromatase inhibitor -I encouraged her to take calcium and vitamin D  3. Skin rash -Secondary to Dermabond -She'll continue use Benadryl as needed  4. Hypertension, depression  -She is on Zoloft, metoprolol and Wellbutrin  -Continue follow-up with her primary care physician   Plan -lab test today including CBC, CMP and FSH  -I'll call you tamoxifen or letrozole, depends on her menopause status, and she'll start in 2 weeks  -7 survivorship clinic in 4-6 weeks -I'll plan to see her back in 3 months  Orders Placed This Encounter  Procedures  . CBC with Differential    Standing Status:   Standing    Number of Occurrences:   30    Standing Expiration Date:   06/19/2021  . Comprehensive metabolic panel    Standing Status:   Standing    Number of Occurrences:   30    Standing Expiration Date:   06/19/2021    All questions were answered. The patient knows to call the clinic with any problems, questions or concerns. I spent 25 minutes counseling the patient face to face. The total time spent in the appointment was 30 minutes and more than 50% was on counseling.     [name], MD 06/19/2016 11:33 AM

## 2016-06-19 NOTE — Telephone Encounter (Signed)
Appointments scheduled per 10/26 LOS. Patient given AVS report and Calendars with future scheduled appointments. °

## 2016-06-22 ENCOUNTER — Encounter: Payer: Self-pay | Admitting: Hematology

## 2016-06-23 NOTE — Addendum Note (Signed)
Addended by: [name] on: 06/23/2016 08:41 AM   Modules accepted: Orders

## 2016-06-24 ENCOUNTER — Ambulatory Visit (HOSPITAL_BASED_OUTPATIENT_CLINIC_OR_DEPARTMENT_OTHER): Payer: PRIVATE HEALTH INSURANCE

## 2016-06-24 DIAGNOSIS — Z170 Estrogen receptor positive status [ER+]: Secondary | ICD-10-CM

## 2016-06-24 DIAGNOSIS — C50411 Malignant neoplasm of upper-outer quadrant of right female breast: Secondary | ICD-10-CM

## 2016-06-24 LAB — COMPREHENSIVE METABOLIC PANEL
ALBUMIN: 3.9 g/dL (ref 3.5–5.0)
ALK PHOS: 61 U/L (ref 40–150)
ALT: 19 U/L (ref 0–55)
ANION GAP: 10 meq/L (ref 3–11)
AST: 19 U/L (ref 5–34)
BUN: 16.1 mg/dL (ref 7.0–26.0)
CALCIUM: 9.2 mg/dL (ref 8.4–10.4)
CHLORIDE: 106 meq/L (ref 98–109)
CO2: 24 mEq/L (ref 22–29)
CREATININE: 0.8 mg/dL (ref 0.6–1.1)
EGFR: 87 mL/min/{1.73_m2} — ABNORMAL LOW (ref >90)
Glucose: 87 mg/dl (ref 70–140)
POTASSIUM: 4.6 meq/L (ref 3.5–5.1)
Sodium: 140 mEq/L (ref 136–145)
Total Bilirubin: 0.45 mg/dL (ref 0.20–1.20)
Total Protein: 7.3 g/dL (ref 6.4–8.3)

## 2016-06-24 LAB — CBC WITH DIFFERENTIAL/PLATELET
BASO%: 0.3 % (ref 0.0–2.0)
BASOS ABS: 0 10*3/uL (ref 0.0–0.1)
EOS%: 3.4 % (ref 0.0–7.0)
Eosinophils Absolute: 0.3 10*3/uL (ref 0.0–0.5)
HEMATOCRIT: 39.5 % (ref 34.8–46.6)
HEMOGLOBIN: 13.5 g/dL (ref 11.6–15.9)
LYMPH#: 2 10*3/uL (ref 0.9–3.3)
LYMPH%: 24 % (ref 14.0–49.7)
MCH: 32.7 pg (ref 25.1–34.0)
MCHC: 34.3 g/dL (ref 31.5–36.0)
MCV: 95.3 fL (ref 79.5–101.0)
MONO#: 0.5 10*3/uL (ref 0.1–0.9)
MONO%: 6.3 % (ref 0.0–14.0)
NEUT#: 5.6 10*3/uL (ref 1.5–6.5)
NEUT%: 66 % (ref 38.4–76.8)
PLATELETS: 324 10*3/uL (ref 145–400)
RBC: 4.14 10*6/uL (ref 3.70–5.45)
RDW: 12.9 % (ref 11.2–14.5)
WBC: 8.4 10*3/uL (ref 3.9–10.3)

## 2016-06-25 ENCOUNTER — Other Ambulatory Visit: Payer: Self-pay | Admitting: Hematology

## 2016-06-25 LAB — ESTRADIOL: ESTRADIOL: 68.9 pg/mL

## 2016-06-25 LAB — FOLLICLE STIMULATING HORMONE: FSH: 6.6 m[IU]/mL

## 2016-06-25 NOTE — Progress Notes (Deleted)
Tamoxifen

## 2016-06-27 ENCOUNTER — Telehealth: Payer: Self-pay | Admitting: *Deleted

## 2016-06-27 NOTE — Telephone Encounter (Signed)
-----   Message from [name], MD sent at 06/25/2016 10:34 PM EDT ----- Please let her know that her hormonal test showed she is still pre-menopause, and I will call in Tamoxifen, she can start in a week.  Thanks  [name]  06/25/2016

## 2016-06-27 NOTE — Telephone Encounter (Signed)
Notified pt of premenopausal status per lab & tamoxifen will be sent to her pharmacy per Dr Burr Medico.  Pt expressed understanding.  While prescribing med, interaction warnings popped & up & Dr Burr Medico will discuss sertraline & bupropion with pt's PCP & call pt later.  Pt informed.

## 2016-07-03 NOTE — Progress Notes (Signed)
[place]  [phone] [phone]  Clinic Follow Up Note   Patient Care Team: [name], MD as PCP - General (Family Medicine) Brien Few, MD as Consulting Physician (Obstetrics and Gynecology) Artelia Laroche, CNM as Midwife (Obstetrics and Gynecology) Excell Seltzer, MD as Consulting Physician (General Surgery) [name], MD as Consulting Physician (Hematology) Gery Pray, MD as Consulting Physician (Radiation Oncology) 07/04/2016  CHIEF COMPLAINT:  Follow up right breast cancer  Oncology History   Breast cancer of upper-outer quadrant of right female breast [facility])   Staging form: Breast, AJCC 7th Edition   - Clinical stage from 04/23/2016: Stage IA (T1c(m), N0, M0) - Signed by [name], MD on 04/30/2016   - Pathologic stage from 05/27/2016: Stage IIA (T2(m), N0, cM0) - Unsigned      Breast cancer of upper-outer quadrant of right female breast ([place])   04/17/2016 Mammogram    Mammogram and US showed a 2.2cm cyst at 12:00, a 1.6cm lobulated mass at 9:00 and a 32m mass at 10:00, axilla was negative       04/23/2016 Initial Diagnosis    Breast cancer of upper-outer quadrant of right female breast [place]      04/23/2016 Initial Biopsy    Core needle biopsy of left breast masses at 10:00 and 9:30 positions both showed invasive and insitu mammary carcinoma       04/23/2016 Receptors her2    ER 90%+, PR 80-90%+, HER2-, KI67 10-15%      05/27/2016 Surgery    Right breast mastectomy and sentinel lymph node biopsy      05/27/2016 Pathology Results    Left breast mastectomy showed multifocal invasive and in situ lobular carcinoma, 3.2, 0.9 and 0.9 cm, G1 margins were negative, 3 sentinel lymph nodes were negative.       05/27/2016 Oncotype testing    RS 18, which predicts 10 year distant recurrence risk of 12% with tamoxifen        HISTORY OF PRESENTING ILLNESS (04/30/2016):  Caitlin Fickle89y.o. female is here because of her  recently diagnosed right breast cancer. She is accompanied by her friend and sister to our multidisciplinary breast clinic today.  She failed a breast lump about a few weeks ago. She denies any breast pain, skin change or nipple discharge. She had normal screening mammogram in April 2017. She was seen by her primary care physician for the breast mass, and underwent diagnostic mammogram and ultrasound on 04/17/2016, which showed a 2.2 cm cyst at 12:00 position, a 1.6 cm lobulated mass at 9:00 and a 6 mm mass at 10:00 position, ultrasound of the axilla was negative. She underwent ultrasound guided core needle biopsy of both right breast mass, which all showed invasive lobular carcinoma, ER/PR strongly positive, HER-2 negative.  She feels very well overall, denies any pain or other symptoms. She is physically active, has been quite anxious since her cancer diagnosis.  GYN HISTORY  Menarchal: 12 LMP: 89(hysrectomy)  Contraceptive: 10, stopped in 30's  HRT: none  G1P1: one daughter, 120yo   CURRENT THERAPY: pending adjuvant Tamoxifen   INTERIM HISTORY:  AJanneyreturns for follow-up and further discussion of tamoxifen. She has some concerns about taking tamoxifen. She is not yet menopausal and has read about many of the symptoms of tamoxifen that are similar to menopause. She believes her breast cancer was caused by environmental factors since she does not have a family history. She is willing to taper down her  wellbutrin and zoloft to avoid drug interaction with tamoxifen. She has stopped drinking grapefruit juice.   MEDICAL HISTORY:  Past Medical History:  Diagnosis Date  . Anxiety   . Breast cancer of upper-outer quadrant of right female breast ([place]) 04/25/2016  . Depression   . Family history of breast cancer   . Family history of prostate cancer   . Hypertension    "because of stress" (05/27/2016)  . PONV (postoperative nausea and vomiting)   . Seasonal allergies     SURGICAL  HISTORY: Past Surgical History:  Procedure Laterality Date  . ABDOMINAL HYSTERECTOMY  Aug 2011   [employer] TAH for fibroids  . BREAST BIOPSY  03/2016  . FOOT NEUROMA SURGERY Right 2000?  Marland Kitchen MASTECTOMY COMPLETE / SIMPLE W/ SENTINEL NODE BIOPSY Right 05/27/2016  . MASTECTOMY W/ SENTINEL NODE BIOPSY Right 05/27/2016   Procedure: RIGHT TOTAL MASTECTOMY WITH AXILLARY SENTINEL LYMPH NODE BIOPSY;  Surgeon: Excell Seltzer, MD;  Location: [place];  Service: General;  Laterality: Right;  . TONSILLECTOMY    . WISDOM TOOTH EXTRACTION  ~ 1985    SOCIAL HISTORY: Social History   Social History  . Marital status: Legally Separated    Spouse name: N/A  . Number of children: N/A  . Years of education: N/A   Occupational History  . Law Student    Social History Main Topics  . Smoking status: Former Smoker    Packs/day: 0.50    Years: 15.00    Types: Cigarettes    Quit date: 06/23/1995  . Smokeless tobacco: Never Used  . Alcohol use No     Comment: 05/27/2016 "nothing since 1996"  . Drug use: No     Comment: 05/27/2016 "nothing since 1996"  . Sexual activity: Yes   Other Topics Concern  . Not on file   Social History Narrative   Married. Education: [employer]. Exercise: Gym 2 times a week fit walking.                         FAMILY HISTORY: Family History  Problem Relation Age of Onset  . Hypertension Mother   . Depression Mother   . Hyperlipidemia Mother   . Heart disease Mother   . Prostate cancer Father 15  . Mental illness Sister   . Hypertension Sister   . Prostate cancer Brother     dx in his late 3s  . Mental illness Maternal Grandmother   . Breast cancer Maternal Grandmother     dx in her 21s  . Lung cancer Maternal Grandmother     smoker since age 53; dx in her 51s  . Cancer Maternal Grandfather   . Heart disease Maternal Grandfather   . Mental illness Maternal Grandfather   . Lung cancer Paternal Grandmother     dx in her 74s, life long smoker  . Prostate  cancer Paternal Grandfather   . Allergies Daughter   . Anxiety disorder Daughter   . Melanoma Brother 46  . Bladder Cancer Sister 33    ALLERGIES:  has No Known Allergies.  MEDICATIONS:  Current Outpatient Prescriptions  Medication Sig Dispense Refill  . buPROPion (WELLBUTRIN SR) 100 MG 12 hr tablet Take 1 tablet (100 mg total) by mouth daily. 90 tablet 3  . cholecalciferol (VITAMIN D) 1000 UNITS tablet Take 2,000 Units by mouth daily.     . COD LIVER OIL PO Take 1 capsule by mouth daily.    Marland Kitchen ibuprofen (ADVIL,MOTRIN)  200 MG tablet Take 200 mg by mouth every 6 (six) hours as needed for mild pain.    . Melatonin 1 MG TABS Take 1 mg by mouth at bedtime as needed (sleep).    . metoprolol tartrate (LOPRESSOR) 25 MG tablet Take 0.5 tablets (12.5 mg total) by mouth 2 (two) times daily. May increase after 1 week to 1 tab po BID if blood pressure elevated. (Patient taking differently: Take 25 mg by mouth 2 (two) times daily. May increase after 1 week to 1 tab po BID if blood pressure elevated.) 60 tablet 1  . sertraline (ZOLOFT) 100 MG tablet Take 150 mg by mouth daily.    . Omega-3 Fatty Acids (FISH OIL PO) Take 1 capsule by mouth daily.      No current facility-administered medications for this visit.     REVIEW OF SYSTEMS:   Constitutional: Denies fevers, chills or abnormal night sweats Eyes: Denies blurriness of vision, double vision or watery eyes Ears, nose, mouth, throat, and face: Denies mucositis or sore throat Respiratory: Denies cough, dyspnea or wheezes Cardiovascular: Denies palpitation, chest discomfort or lower extremity swelling Gastrointestinal:  Denies nausea, heartburn or change in bowel habits Skin: Denies abnormal skin rashes Lymphatics: Denies new lymphadenopathy or easy bruising Neurological:Denies numbness, tingling or new weaknesses Behavioral/Psych: Mood is stable, no new changes  All other systems were reviewed with the patient and are negative.  PHYSICAL  EXAMINATION: ECOG PERFORMANCE STATUS: 0 - Asymptomatic  Vitals:   07/04/16 1610  BP: (!) 139/93  Pulse: (!) 103  Resp: 18  Temp: 97.5 F (36.4 C)   Filed Weights   07/04/16 1610  Weight: 190 lb 1.6 oz (86.2 kg)    GENERAL:alert, no distress and comfortable SKIN: skin color, texture, turgor are normal, no rashes or significant lesions EYES: normal, conjunctiva are pink and non-injected, sclera clear OROPHARYNX:no exudate, no erythema and lips, buccal mucosa, and tongue normal  NECK: supple, thyroid normal size, non-tender, without nodularity LYMPH:  no palpable lymphadenopathy in the cervical, axillary or inguinal LUNGS: clear to auscultation and percussion with normal breathing effort HEART: regular rate & rhythm and no murmurs and no lower extremity edema ABDOMEN:abdomen soft, non-tender and normal bowel sounds Musculoskeletal:no cyanosis of digits and no clubbing  PSYCH: alert & oriented x 3 with fluent speech NEURO: no focal motor/sensory deficits Breasts: Breast inspection showed them to be symmetrical with no nipple discharge.  right breast is surgically absent, the incision is healing well, no discharge or skin redness. There are diffuse skin rash on her right side chest wall , no blister or ulcers.  Palpation of the left beast and axilla revealed no obvious mass that I could appreciate.   LABORATORY DATA:  I have reviewed the data as listed CBC Latest Ref Rng & Units 06/24/2016 06/19/2016 05/28/2016  WBC 3.9 - 10.3 10e3/uL 8.4 9.9 11.9(H)  Hemoglobin 11.6 - 15.9 g/dL 13.5 13.4 11.3(L)  Hematocrit 34.8 - 46.6 % 39.5 38.6 34.3(L)  Platelets 145 - 400 10e3/uL 324 333 245   CMP Latest Ref Rng & Units 06/24/2016 06/19/2016 05/23/2016  Glucose 70 - 140 mg/dl 87 95 90  BUN 7.0 - 26.0 mg/dL 16.1 11.5 16  Creatinine 0.6 - 1.1 mg/dL 0.8 0.8 0.88  Sodium 136 - 145 mEq/L 140 140 138  Potassium 3.5 - 5.1 mEq/L 4.6 4.2 4.4  Chloride 101 - 111 mmol/L - - 106  CO2 22 - 29 mEq/L _0 Calcium 8.4 - 10.4 mg/dL  9.2 9.5 9.3  Total Protein 6.4 - 8.3 g/dL 7.3 7.4 -  Total Bilirubin 0.20 - 1.20 mg/dL 0.45 0.56 -  Alkaline Phos 40 - 150 U/L 61 60 -  AST 5 - 34 U/L 19 18 -  ALT 0 - 55 U/L 19 16 -   PATHOLOGY REPORT  Diagnosis 04/23/2016 1. Breast, right, needle core biopsy, 10 o'clock - INVASIVE AND IN SITU MAMMARY CARCINOMA. 2. Breast, right, needle core biopsy, 9:30 o'clock - INVASIVE AND IN SITU MAMMARY CARCINOMA.  Microscopic Comment 1. , 2. E-Cadherin and breast prognostic profile will be performed.  1. Results: IMMUNOHISTOCHEMICAL AND MORPHOMETRIC ANALYSIS PERFORMED MANUALLY Estrogen Receptor: 90%, POSITIVE, STRONG STAINING INTENSITY Progesterone Receptor: 80%, POSITIVE, STRONG STAINING INTENSITY Proliferation Marker Ki67: 10%  Results: HER2 - NEGATIVE RATIO OF HER2/CEP17 SIGNALS 1.60 AVERAGE HER2 COPY NUMBER PER CELL 2.00  2. PROGNOSTIC INDICATORS Results: IMMUNOHISTOCHEMICAL AND MORPHOMETRIC ANALYSIS PERFORMED MANUALLY Estrogen Receptor: 90%, POSITIVE, STRONG STAINING INTENSITY Progesterone Receptor: 90%, POSITIVE, STRONG STAINING INTENSITY Proliferation Marker Ki67: 15%  Results: HER2 - NEGATIVE RATIO OF HER2/CEP17 SIGNALS 1.36 AVERAGE HER2 COPY NUMBER PER CELL 1.70  Diagnosis 05/27/2016 1. Breast, simple mastectomy, Right - MULTIFOCAL INVASIVE AND IN SITU LOBULAR CARCINOMA, 3.2, 0.9, AND 0.9 CM. - MULTIFOCAL LOBULAR CARCINOMA IN SITU. - MARGINS NOT INVOLVED. - FIBROCYSTIC CHANGES WITH PSEUDOANGIOMATOUS STROMAL HYPERPLASIA. - PREVIOUS BIOPSY SITES. 2. Lymph node, sentinel, biopsy, Right axillary #1 - ONE BENIGN LYMPH NODE (0/1). 3. Lymph node, sentinel, biopsy, Right axillary #2 - ONE BENIGN LYMPH NODE (0/1). 4. Lymph node, sentinel, biopsy, Right axillary #3 - ONE BENIGN LYMPH NODE (0/1). Microscopic Comment 1. BREAST, INVASIVE TUMOR, WITH LYMPH NODES PRESENT Specimen, including laterality and lymph node sampling (sentinel,  non-sentinel): Right breast and three sentinel lymph nodes. Procedure: Mastectomy and sentinel lymph node biopsies. Histologic type: Lobular. Grade: 1 Tubule formation: 3 Nuclear pleomorphism: 1 Mitotic:1 Tumor size (gross measurement): 3.2, 0.9, and 0.9 cm Margins: Free of tumor. Invasive, distance to closest margin: 0.9 cm from anterior margin. In-situ, distance to closest margin: 0.9 cm from anterior margin. If margin positive, focally or broadly: N/A Lymphovascular invasion: Not identified. Ductal carcinoma in situ: No. Grade: N/A 1 of 4 FINAL for Welborn, Kseniya C ([id number]) Microscopic Comment(continued) Extensive intraductal component: N/A Lobular neoplasia: Yes, lobular carcinoma in situ. Tumor focality: Multifocal. Extent of tumor: Skin: Free of tumor Nipple: Free of tumor. Skeletal muscle: Free of tumor. Lymph nodes: Examined: 3 Sentinel 0 Non-sentinel 3 Total Lymph nodes with metastasis: 0 Isolated tumor cells (< 0.2 mm): 0 Micrometastasis: (> 0.2 mm and < 2.0 mm): 0 Macrometastasis: (> 2.0 mm): 0 Extracapsular extension: N/A Breast prognostic profile: Case [id number]. Estrogen receptor: 90%, positive, strong staining. Progesterone receptor: 90%, positive, strong staining. Her 2 neu: Negative, ratio 1.36 and 1.6. Ki-67: 10% and 15%. Non-neoplastic breast: Fibrocystic changes and pseudoangiomatous stromal hyperplasia. TNM: pT2(multifocal), pN0, pMX Comments: There are three nodules of invasive and in situ lobular carcinoma which are 3.2, 0.9, and 0.9 cm. There are also separate foci of lobular carcinoma in situ. The margins of the specimen are not involved by invasive or in situ lobular carcinoma. Immunohistochemistry for cytokeratin AE1/AE3 is performed on on all of the sentinel lymph nodes and no positivity is identified. (JDP:gt, 05/29/16)  Oncotype Dx RS 18, which predicts 10 year distant recurrence risk of 12% with tamoxifen  RADIOGRAPHIC STUDIES: I  have personally reviewed the radiological images as listed and agreed with the findings in the report. No results found.  ASSESSMENT &  PLAN:  50 year old Caucasian female, no significant past medical history, presented with a palpable right breast mass  1. Breast cancer of upper outer quadrant of right breast, invasive lobular carcinoma, mpT2N0M0, stage IIA, ER+/PR+/HER2-, G1, RS 18  ---I previously discussed her surgical path result in details. -She had multifocal disease, surgical margins were negative. Nodes were negative. -the Oncotype Dx result was reviewed with her in details. She has intermedia risk based on the recurrence score, which predicts 10 year distant recurrence after 5 years of tamoxifen 12%. The benefit of chemotherapy in the intermedia risk group is small and controversial. Given her relatively low score in the intermedia group, I did not recommend adjuvant chemotherapy. She agrees with the plan -Giving the strong ER and PR expression in her tumor, I recommend adjuvant endocrine therapy -Her FSH and estradiol level showed she is still premenopausal -I recommend her to start adjuvant tamoxifen to reduce her risk of cancer recurrence, especially distant recurrence. We reviewed her risk of recurrence based on her Oncotype DX test result, and the benefit of tamoxifen again. -She is on Wellbutrin and Zoloft, which has mild-to-moderate interaction with tamoxifen, I suggest her to wean off gradually, and replace it with Effexor. She agrees to see a psychologist to adjust her medication -We also discussed the other option of offering suppression plus an aromatase inhibitor, or change her tamoxifen to aromatase inhibitor when she is truly postmenopausal down the road. She is 64, likely will be post menopause soon -I had a lengthy discussion, she agrees to try tamoxifen after she changes her antidepressant medication. We'll plan to start her on tamoxifen in 1-2 months  2. Bone health -If  she is post menopause, I'll obtain a bone density scan -We discussed the effect on bone density from tamoxifen or aromatase inhibitor -I encouraged her to take calcium and vitamin D  3. Skin rash -Secondary to Dermabond -She'll continue use Benadryl as needed  4. Hypertension, depression  -She is on Zoloft, metoprolol and Wellbutrin  -She plans to make an appointment with her psychiatrist to discuss tapering Zoloft and Wellbutrin, to eventually switch to Effexor -Continue follow-up with her primary care physician   Plan -We discussed the benefits and risks of tamoxifen at length today. -She will begin taking tamoxifen after she has switched to effexor -She will call our office to make an appointment after taking tamoxifen for 1 month   No orders of the defined types were placed in this encounter.   All questions were answered. The patient knows to call the clinic with any problems, questions or concerns. I spent 25 minutes counseling the patient face to face. The total time spent in the appointment was 30 minutes and more than 50% was on counseling.   This document serves as a record of services personally performed by [name], MD. It was created on her behalf by Arlyce Harman, a trained medical scribe. The creation of this record is based on the scribe's personal observations and the provider's statements to them. This document has been checked and approved by the attending provider.   [name], MD 07/04/2016 6:36 PM

## 2016-07-04 ENCOUNTER — Encounter: Payer: Self-pay | Admitting: Hematology

## 2016-07-04 ENCOUNTER — Ambulatory Visit (HOSPITAL_BASED_OUTPATIENT_CLINIC_OR_DEPARTMENT_OTHER): Payer: PRIVATE HEALTH INSURANCE | Admitting: Hematology

## 2016-07-04 VITALS — BP 139/93 | HR 103 | Temp 97.5°F | Resp 18 | Ht 68.0 in | Wt 190.1 lb

## 2016-07-04 DIAGNOSIS — R21 Rash and other nonspecific skin eruption: Secondary | ICD-10-CM

## 2016-07-04 DIAGNOSIS — F329 Major depressive disorder, single episode, unspecified: Secondary | ICD-10-CM

## 2016-07-04 DIAGNOSIS — I10 Essential (primary) hypertension: Secondary | ICD-10-CM

## 2016-07-04 DIAGNOSIS — Z170 Estrogen receptor positive status [ER+]: Secondary | ICD-10-CM

## 2016-07-04 DIAGNOSIS — C50411 Malignant neoplasm of upper-outer quadrant of right female breast: Secondary | ICD-10-CM

## 2016-07-04 DIAGNOSIS — F32A Depression, unspecified: Secondary | ICD-10-CM

## 2016-07-15 ENCOUNTER — Ambulatory Visit (INDEPENDENT_AMBULATORY_CARE_PROVIDER_SITE_OTHER): Payer: PRIVATE HEALTH INSURANCE

## 2016-07-15 ENCOUNTER — Ambulatory Visit (INDEPENDENT_AMBULATORY_CARE_PROVIDER_SITE_OTHER): Payer: PRIVATE HEALTH INSURANCE | Admitting: Family Medicine

## 2016-07-15 VITALS — BP 132/80 | HR 86 | Temp 98.7°F | Resp 17 | Ht 69.0 in | Wt 189.0 lb

## 2016-07-15 DIAGNOSIS — M542 Cervicalgia: Secondary | ICD-10-CM

## 2016-07-15 DIAGNOSIS — R030 Elevated blood-pressure reading, without diagnosis of hypertension: Secondary | ICD-10-CM | POA: Diagnosis not present

## 2016-07-15 DIAGNOSIS — R29898 Other symptoms and signs involving the musculoskeletal system: Secondary | ICD-10-CM

## 2016-07-15 DIAGNOSIS — Z113 Encounter for screening for infections with a predominantly sexual mode of transmission: Secondary | ICD-10-CM

## 2016-07-15 DIAGNOSIS — Z7251 High risk heterosexual behavior: Secondary | ICD-10-CM

## 2016-07-15 LAB — HIV ANTIBODY (ROUTINE TESTING W REFLEX): HIV 1&2 Ab, 4th Generation: NONREACTIVE

## 2016-07-15 NOTE — Patient Instructions (Addendum)
Keep a record of your blood pressures outside of the office and if remains under 140/90 - can hold off on meds.  If higher readings - follow up to discuss treatment. Return to the clinic or go to the nearest emergency room if any of your symptoms worsen or new symptoms occur.   I'll check an x-ray of your neck today, but we'll also order an MRI to determine if any nerve or disc issues causing your arm symptoms. If that is normal, the next step would be meeting with a neurologist. Return to the clinic or go to the nearest emergency room if any of your symptoms worsen or new symptoms occur.    How to Take Your Blood Pressure Blood pressure is a measurement of how strongly your blood is pressing against the walls of your arteries. Arteries are blood vessels that carry blood from your heart throughout your body. Your health care provider takes your blood pressure at each office visit. You can also take your own blood pressure at home with a blood pressure machine. You may need to take your own blood pressure:  To confirm a diagnosis of high blood pressure (hypertension).  To monitor your blood pressure over time.  To make sure your blood pressure medicine is working. Supplies needed: To take your blood pressure, you will need a blood pressure machine. You can buy a blood pressure machine, or blood pressure monitor, at most drugstores or online. There are several types of home blood pressure monitors. When choosing one, consider the following:  Choose a monitor that has an arm cuff.  Choose a monitor that wraps snugly around your upper arm. You should be able to fit only one finger between your arm and the cuff.  Do not choose a monitor that measures your blood pressure from your wrist or finger. Your health care provider can suggest a reliable monitor that will meet your needs. How to prepare To get the most accurate reading, avoid the following for 30 minutes before you check your blood  pressure:  Drinking caffeine.  Drinking alcohol.  Eating.  Smoking.  Exercising. Five minutes before you check your blood pressure:  Empty your bladder.  Sit quietly without talking in a dining chair, rather than in a soft couch or armchair. How to take your blood pressure To check your blood pressure, follow the instructions in the manual that came with your blood pressure monitor. If you have a digital blood pressure monitor, the instructions may be as follows: 1. Sit up straight. 2. Place your feet on the floor. Do not cross your ankles or legs. 3. Rest your left arm at the level of your heart on a table or desk or on the arm of a chair. 4. Pull up your shirt sleeve. 5. Wrap the blood pressure cuff around the upper part of your left arm, 1 inch (2.5 cm) above your elbow. It is best to wrap the cuff around bare skin. 6. Fit the cuff snugly around your arm. You should be able to place only one finger between the cuff and your arm. 7. Position the cord inside the groove of your elbow. 8. Press the power button. 9. Sit quietly while the cuff inflates and deflates. 10. Read the digital reading on the monitor screen and write it down (record it). 11. Wait 2-3 minutes, then repeat the steps starting at step 1. What does my blood pressure reading mean? A blood pressure reading is recorded as two numbers, such as "  120 over 80" (or 120/80). The first ("top") number is called the systolic pressure. It is a measure of the pressure in your arteries as the heart beats. The second ("bottom") number is called the diastolic pressure. It is a measure of the pressure in your arteries as the heart relaxes between beats. Blood pressure is classified into four stages. The following are the stages for adults who do not have a short-term serious illness or a chronic condition. Systolic pressure and diastolic pressure are measured in a unit called mm Hg. Normal  Systolic pressure: below 123456.  Diastolic  pressure: below 80. Prehypertension  Systolic pressure: 123456.  Diastolic pressure: [id number]. Hypertension stage 1  Systolic pressure: A999333.  Diastolic pressure: A999333. Hypertension stage 2  Systolic pressure: 0000000 or above.  Diastolic pressure: 123XX123 or above. You can have prehypertension or hypertension even if only the systolic or only the diastolic number in your reading is higher than normal. Follow these instructions at home:  Check your blood pressure as often as recommended by your health care provider.  Take your monitor to the next appointment with your health care provider to make sure:  That you are using it correctly.  That it provides accurate readings.  Be sure you understand what your goal blood pressure numbers are.  Tell your health care provider if you are having any side effects from blood pressure medicine. Contact a health care provider if:  Your blood pressure is consistently high. Get help right away if:  Your systolic blood pressure is higher than 180.  Your diastolic blood pressure is higher than 110. This information is not intended to replace advice given to you by your health care provider. Make sure you discuss any questions you have with your health care provider. Document Released: 01/18/2016 Document Revised: 04/01/2016 Document Reviewed: 01/18/2016 Elsevier Interactive Patient Education  2017 [employer].    IF you received an x-ray today, you will receive an invoice from [facility]. Please contact [facility] at [phone] with questions or concerns regarding your invoice.   IF you received labwork today, you will receive an invoice from [employer]. Please contact [facility] at [phone] with questions or concerns regarding your invoice.   Our billing staff will not be able to assist you with questions regarding bills from these companies.  You will be contacted with the lab results  as soon as they are available. The fastest way to get your results is to activate your My Chart account. Instructions are located on the last page of this paperwork. If you have not heard from [place] regarding the results in 2 weeks, please contact this office.

## 2016-07-15 NOTE — Progress Notes (Signed)
By signing my name below, I, [name], attest that this documentation has been prepared under the direction and in the presence of [name], MD.  Electronically Signed: [name], Medical Scribe. 07/15/16. 11:52 AM.  Subjective:    Patient ID: Caitlin Monroe, female    DOB: Jun 21, 1966, 50 y.o.   MRN: [id number]  HPI Chief Complaint  Patient presents with  . Hypertension  . Extremity Weakness    HPI Comments: MAARIA Monroe is a 50 y.o. female who presents to the [facility] complaining of HTN and arm pain. Last seen by me 9/11 she was having some palpitations at that time. BPs 0000000 systolic, diastolic into the 0000000; see details of that visit. Stared on metoprolol 25mg  BID. She ad a nl TSH, prev nl CMP.  Arm Pain: Reports episodc feeling tightness or cramps in her shoulders, left more often than right but rarely both at the same time, for the past 2 months thats described as gripped shoulders that last for a couple of minutes. When it occurs her arms gets weak, start tingling, and her muscles contract causing her to loose arm function and have "t-rex arms". Her last episode was 5 days ago in her left arm. She has been having constant neck pain for the past 10 years and suspects it's sleep positioning, or lifting heavy objects.  HTN: Took metoprolol until 05/27/16 for her right axillary nodes disection. Checked her bp yesterday and it was 111/84. 8 days after discontinuing her bp was 132/91. Mentions it never went over 140/90. States her palpitations have subsided. Lab Results  Component Value Date   CREATININE 0.8 06/24/2016   BP Readings from Last 3 Encounters:  07/15/16 132/80  07/04/16 (!) 139/93  06/19/16 (!) 133/92   STI Screening: Pt reports unprotected intercourse with a new partner and states she's never has an infection in the past.  Patient Active Problem List   Diagnosis Date Noted  . Breast cancer, right ([place]) 05/27/2016  .  Genetic testing 05/13/2016  . Family history of breast cancer   . Family history of prostate cancer   . Breast cancer of upper-outer quadrant of right female breast ([place]) 04/25/2016  . Depression 06/27/2015  . Seasonal affective disorder ([place]) 06/27/2015  . Allergic rhinitis 04/05/2013  . Depression with anxiety 04/05/2013   Past Medical History:  Diagnosis Date  . Anxiety   . Breast cancer of upper-outer quadrant of right female breast ([place]) 04/25/2016  . Depression   . Family history of breast cancer   . Family history of prostate cancer   . Hypertension    "because of stress" (05/27/2016)  . PONV (postoperative nausea and vomiting)   . Seasonal allergies    Past Surgical History:  Procedure Laterality Date  . ABDOMINAL HYSTERECTOMY  Aug 2011   [facility] TAH for fibroids  . BREAST BIOPSY  03/2016  . FOOT NEUROMA SURGERY Right 2000?  [facility] Kitchen MASTECTOMY COMPLETE / SIMPLE W/ SENTINEL NODE BIOPSY Right 05/27/2016  . MASTECTOMY W/ SENTINEL NODE BIOPSY Right 05/27/2016   Procedure: RIGHT TOTAL MASTECTOMY WITH AXILLARY SENTINEL LYMPH NODE BIOPSY;  Surgeon: Excell Seltzer, MD;  Location: [facility];  Service: General;  Laterality: Right;  . TONSILLECTOMY    . WISDOM TOOTH EXTRACTION  ~ 1985   No Known Allergies Prior to Admission medications   Medication Sig Start Date End Date Taking? Authorizing Provider  buPROPion (WELLBUTRIN SR) 100 MG 12 hr tablet Take 1 tablet (100 mg  total) by mouth daily. 06/27/15  Yes Gay Filler Copland, MD  cholecalciferol (VITAMIN D) 1000 UNITS tablet Take 2,000 Units by mouth daily.    Yes Historical Provider, MD  COD LIVER OIL PO Take 1 capsule by mouth daily.   Yes Historical Provider, MD  ibuprofen (ADVIL,MOTRIN) 200 MG tablet Take 200 mg by mouth every 6 (six) hours as needed for mild pain.   Yes Historical Provider, MD  Melatonin 1 MG TABS Take 1 mg by mouth at bedtime as needed (sleep).   Yes Historical Provider, MD  metoprolol tartrate (LOPRESSOR) 25 MG tablet  Take 0.5 tablets (12.5 mg total) by mouth 2 (two) times daily. May increase after 1 week to 1 tab po BID if blood pressure elevated. Patient taking differently: Take 25 mg by mouth 2 (two) times daily. May increase after 1 week to 1 tab po BID if blood pressure elevated. 05/05/16  Yes [name], MD  Omega-3 Fatty Acids (FISH OIL PO) Take 1 capsule by mouth daily.    Yes Historical Provider, MD  sertraline (ZOLOFT) 100 MG tablet Take 150 mg by mouth daily.   Yes Historical Provider, MD   Social History   Social History  . Marital status: Legally Separated    Spouse name: N/A  . Number of children: N/A  . Years of education: N/A   Occupational History  . Law Student    Social History Main Topics  . Smoking status: Former Smoker    Packs/day: 0.50    Years: 15.00    Types: Cigarettes    Quit date: 06/23/1995  . Smokeless tobacco: Never Used  . Alcohol use No     Comment: 05/27/2016 "nothing since 1996"  . Drug use: No     Comment: 05/27/2016 "nothing since 1996"  . Sexual activity: Yes   Other Topics Concern  . Not on file   Social History Narrative   Married. Education: [employer]. Exercise: Gym 2 times a week fit walking.                        Review of Systems  Cardiovascular: Negative for palpitations.  Musculoskeletal: Positive for myalgias, neck pain and neck stiffness.  Neurological: Negative for numbness.   Objective:  Physical Exam  Constitutional: She is oriented to person, place, and time. She appears well-developed and well-nourished.  HENT:  Head: Normocephalic and atraumatic.  Eyes: Conjunctivae and EOM are normal. Pupils are equal, round, and reactive to light.  Neck: Carotid bruit is not present.  No midline bony tenderness Tightness with lateral flexion bilaterally, otherwise FROM intact  Cardiovascular: Normal rate, regular rhythm, normal heart sounds and intact distal pulses.  Exam reveals no friction rub.   No murmur heard. Pulmonary/Chest:  Effort normal and breath sounds normal. No respiratory distress. She has no wheezes. She has no rales.  Abdominal: Soft. She exhibits no pulsatile midline mass. There is no tenderness.  Neurological: She is alert and oriented to person, place, and time.  Reflex Scores:      Tricep reflexes are 2+ on the right side and 2+ on the left side.      Bicep reflexes are 2+ on the right side and 2+ on the left side.      Brachioradialis reflexes are 2+ on the right side and 2+ on the left side. Strength equal and intact  Skin: Skin is warm and dry.  Psychiatric: She has a normal mood and affect. Her behavior  is normal.  Vitals reviewed.  BP 132/80 (BP Location: Right Arm, Patient Position: Sitting, Cuff Size: Normal)   Pulse 86   Temp 98.7 F (37.1 C) (Oral)   Resp 17   Ht 5\' 9"  (1.753 m)   Wt 189 lb (85.7 kg)   SpO2 96%   BMI 27.91 kg/m    Dg Cervical Spine Complete  Result Date: 07/15/2016 CLINICAL DATA:  Neck pain with intermittent arm weakness EXAM: CERVICAL SPINE - COMPLETE 4+ VIEW COMPARISON:  None. FINDINGS: Seven cervical segments are well visualized. Vertebral body height is well maintained. Disc space narrowing is noted at C5-6 and C6-7 with anterior and posterior osteophytes. Mild loss of the normal cervical lordosis is noted. No prevertebral soft tissue changes are seen. The neural foramina are patent. Some mild facet hypertrophic changes are noted bilaterally. The odontoid is within normal limits. IMPRESSION: Multilevel degenerative change particularly at C5-6 and C6-7. No acute abnormality is noted. Electronically Signed   By: Inez Catalina M.D.   On: 07/15/2016 12:32   Assessment & Plan:   OLUWANIFEMI PELISSIER is a 50 y.o. female Neck pain - Plan: DG Cervical Spine Complete, MR Cervical Spine Wo Contrast Arm weakness - Plan: DG Cervical Spine Complete, MR Cervical Spine Wo Contrast  - Degenerative changes noted on C-spine x-ray. No focal weakness on exam, reflexes intact. Check MRI  to determine if significant neuronal impingement, and with history of breast cancer, may also be helpful in evaluating bone/soft tissue further. Also could consider CT of cervical spine if needed. If symptoms persist with negative MRI, neurology eval may be next step. RTC precautions if acute worsening.  Elevated blood pressure reading  - prior borderline HTN. Now controlled off meds. No new Rx at this time. Continue to monitor out of office. rtc precautions.   Routine screening for STI (sexually transmitted infection) - Plan: HIV antibody, RPR, GC/Chlamydia Probe Amp,History of unprotected sex - Plan: HIV antibody, RPR, GC/Chlamydia Probe Amp  - sti testing obtained.   No orders of the defined types were placed in this encounter.  Patient Instructions    Keep a record of your blood pressures outside of the office and if remains under 140/90 - can hold off on meds.  If higher readings - follow up to discuss treatment. Return to the clinic or go to the nearest emergency room if any of your symptoms worsen or new symptoms occur.   I'll check an x-ray of your neck today, but we'll also order an MRI to determine if any nerve or disc issues causing your arm symptoms. If that is normal, the next step would be meeting with a neurologist. Return to the clinic or go to the nearest emergency room if any of your symptoms worsen or new symptoms occur.    How to Take Your Blood Pressure Blood pressure is a measurement of how strongly your blood is pressing against the walls of your arteries. Arteries are blood vessels that carry blood from your heart throughout your body. Your health care provider takes your blood pressure at each office visit. You can also take your own blood pressure at home with a blood pressure machine. You may need to take your own blood pressure:  To confirm a diagnosis of high blood pressure (hypertension).  To monitor your blood pressure over time.  To make sure your blood  pressure medicine is working. Supplies needed: To take your blood pressure, you will need a blood pressure machine. You can buy  a blood pressure machine, or blood pressure monitor, at most drugstores or online. There are several types of home blood pressure monitors. When choosing one, consider the following:  Choose a monitor that has an arm cuff.  Choose a monitor that wraps snugly around your upper arm. You should be able to fit only one finger between your arm and the cuff.  Do not choose a monitor that measures your blood pressure from your wrist or finger. Your health care provider can suggest a reliable monitor that will meet your needs. How to prepare To get the most accurate reading, avoid the following for 30 minutes before you check your blood pressure:  Drinking caffeine.  Drinking alcohol.  Eating.  Smoking.  Exercising. Five minutes before you check your blood pressure:  Empty your bladder.  Sit quietly without talking in a dining chair, rather than in a soft couch or armchair. How to take your blood pressure To check your blood pressure, follow the instructions in the manual that came with your blood pressure monitor. If you have a digital blood pressure monitor, the instructions may be as follows: 1. Sit up straight. 2. Place your feet on the floor. Do not cross your ankles or legs. 3. Rest your left arm at the level of your heart on a table or desk or on the arm of a chair. 4. Pull up your shirt sleeve. 5. Wrap the blood pressure cuff around the upper part of your left arm, 1 inch (2.5 cm) above your elbow. It is best to wrap the cuff around bare skin. 6. Fit the cuff snugly around your arm. You should be able to place only one finger between the cuff and your arm. 7. Position the cord inside the groove of your elbow. 8. Press the power button. 9. Sit quietly while the cuff inflates and deflates. 10. Read the digital reading on the monitor screen and write it  down (record it). 11. Wait 2-3 minutes, then repeat the steps starting at step 1. What does my blood pressure reading mean? A blood pressure reading is recorded as two numbers, such as "120 over 80" (or 120/80). The first ("top") number is called the systolic pressure. It is a measure of the pressure in your arteries as the heart beats. The second ("bottom") number is called the diastolic pressure. It is a measure of the pressure in your arteries as the heart relaxes between beats. Blood pressure is classified into four stages. The following are the stages for adults who do not have a short-term serious illness or a chronic condition. Systolic pressure and diastolic pressure are measured in a unit called mm Hg. Normal  Systolic pressure: below 123456.  Diastolic pressure: below 80. Prehypertension  Systolic pressure: 123456.  Diastolic pressure: [id number]. Hypertension stage 1  Systolic pressure: A999333.  Diastolic pressure: A999333. Hypertension stage 2  Systolic pressure: 0000000 or above.  Diastolic pressure: 123XX123 or above. You can have prehypertension or hypertension even if only the systolic or only the diastolic number in your reading is higher than normal. Follow these instructions at home:  Check your blood pressure as often as recommended by your health care provider.  Take your monitor to the next appointment with your health care provider to make sure:  That you are using it correctly.  That it provides accurate readings.  Be sure you understand what your goal blood pressure numbers are.  Tell your health care provider if you are having any side effects from blood  pressure medicine. Contact a health care provider if:  Your blood pressure is consistently high. Get help right away if:  Your systolic blood pressure is higher than 180.  Your diastolic blood pressure is higher than 110. This information is not intended to replace advice given to you by your health care  provider. Make sure you discuss any questions you have with your health care provider. Document Released: 01/18/2016 Document Revised: 04/01/2016 Document Reviewed: 01/18/2016 Elsevier Interactive Patient Education  2017 [employer].    IF you received an x-ray today, you will receive an invoice from [facility]. Please contact [facility] at [phone] with questions or concerns regarding your invoice.   IF you received labwork today, you will receive an invoice from [employer]. Please contact [facility] at [phone] with questions or concerns regarding your invoice.   Our billing staff will not be able to assist you with questions regarding bills from these companies.  You will be contacted with the lab results as soon as they are available. The fastest way to get your results is to activate your My Chart account. Instructions are located on the last page of this paperwork. If you have not heard from [place] regarding the results in 2 weeks, please contact this office.        I personally performed the services described in this documentation, which was scribed in my presence. The recorded information has been reviewed and considered, and addended by me as needed.   Signed,   [name], MD [facility].  07/15/16 1:08 PM

## 2016-07-16 LAB — GC/CHLAMYDIA PROBE AMP
CT PROBE, AMP APTIMA: NOT DETECTED
GC PROBE AMP APTIMA: NOT DETECTED

## 2016-07-16 LAB — RPR

## 2016-07-23 ENCOUNTER — Other Ambulatory Visit: Payer: PRIVATE HEALTH INSURANCE

## 2016-08-07 NOTE — Progress Notes (Signed)
CLINIC:  Survivorship   REASON FOR VISIT:  Routine follow-up post-treatment for a recent history of breast cancer.  BRIEF ONCOLOGIC HISTORY:  Oncology History   Breast cancer of upper-outer quadrant of right female breast [facility])   Staging form: Breast, AJCC 7th Edition   - Clinical stage from 04/23/2016: Stage IA (T1c(m), N0, M0) - Signed by [name], MD on 04/30/2016   - Pathologic stage from 05/27/2016: Stage IIA (T2(m), N0, cM0) - Unsigned      Breast cancer of upper-outer quadrant of right female breast ([place])   04/17/2016 Mammogram    Mammogram and US showed a 2.2cm cyst at 12:00, a 1.6cm lobulated mass at 9:00 and a 61m mass at 10:00, axilla was negative       04/23/2016 Initial Diagnosis    Breast cancer of upper-outer quadrant of right [place] breast [place]      04/23/2016 Initial Biopsy    Core needle biopsy of right breast masses at 10:00 and 9:30 positions both showed invasive and insitu mammary carcinoma       04/23/2016 Receptors her2    ER 90%+, PR 80-90%+, HER2-, KI67 10-15%      05/12/2016 Procedure    Genetic testing: Negative. Genes analyzed: ATM, BAP1, BARD1, BRCA1, BRCA2, BRIP1, CDH1, CDK4, CDKN2A, CHEK2, EPCAM, FANCC, MITF, MLH1, MSH2, MSH6, NBN, PALB2, PMS2, PTEN, RAD51C, RAD51D, TP53, and XRCC2      05/27/2016 Surgery    Right breast mastectomy and sentinel lymph node biopsy ([place])      05/27/2016 Pathology Results    Right breast mastectomy showed multifocal invasive and in situ lobular carcinoma, 3.2, 0.9 and 0.9 cm, G1 margins were negative, 3 sentinel lymph nodes were negative.       05/27/2016 Oncotype testing    RS 18, which predicts 10 year distant recurrence risk of 12% with tamoxifen (intermediate risk)        Anti-estrogen oral therapy    Tamoxifen 20 mg daily (recommended to start in 08/2016)       INTERVAL HISTORY:  [facility] to review her survivorship care plan  detailing her treatment course for breast cancer, as well as monitoring long-term side effects of that treatment, education regarding health maintenance, screening, and overall wellness and health promotion.     Overall, Ms. SKofoedreports feeling quite well since completing her definitive mastectomy surgery about 2.5 months ago.  She has not started her anti-estrogen therapy yet; she plans to start the Tamoxifen in 08/2016 as directed by Dr. FBurr Medico   She has been working closely with her psychiatrist to wean off of the Zoloft and Wellbutrin. She is now completely off of the Wellbutrin, and on 50 mg of the Zoloft at present. The plan is for her to transition to Effexor XR and then start tamoxifen therapy. She tells me she's been a little bit more tearful lately, but understands that this is due to changes in her medication. She has great support in her family and friends and overall feels like her mood is stable and she is doing okay.  Otherwise, physically she feels great and has recovered quite well. She had initially thought that she did not want right breast reconstruction following her mastectomy. Now she is considering her options, and [facility]   She is here today with her best friend's husband, who has been a very supportive friend  for her during the separation from her husband and through her cancer treatments.   She is looking forward to a trip to [place] soon, and she is also planning a trip to [place] in the summer of 2018.    REVIEW OF SYSTEMS:  Review of Systems  Constitutional: Negative.   HENT: Negative.   Eyes: Negative.   Respiratory: Negative.   Cardiovascular: Negative.   Gastrointestinal: Negative.   Genitourinary: Negative.   Musculoskeletal: Negative.   Skin: Negative.   Neurological: Negative.   Endo/Heme/Allergies: Negative.   Psychiatric/Behavioral: Positive for depression.  Breast: Denies any new  nodularity, masses, tenderness, nipple changes, or nipple discharge.    A 14-point review of systems was completed and was negative, except as noted above.   ONCOLOGY TREATMENT TEAM:  1. Surgeon:  Dr. Saddie Benders at [facility] 2. Medical Oncologist: Dr. Burr Medico     PAST MEDICAL/SURGICAL HISTORY:  Past Medical History:  Diagnosis Date  . Anxiety   . Breast cancer of upper-outer quadrant of right female breast ([place]) 04/25/2016  . Depression   . Family history of breast cancer   . Family history of prostate cancer   . Hypertension    "because of stress" (05/27/2016)  . PONV (postoperative nausea and vomiting)   . Seasonal allergies    Past Surgical History:  Procedure Laterality Date  . ABDOMINAL HYSTERECTOMY  Aug 2011   [employer] TAH for fibroids  . BREAST BIOPSY  03/2016  . FOOT NEUROMA SURGERY Right 2000?  Marland Kitchen MASTECTOMY COMPLETE / SIMPLE W/ SENTINEL NODE BIOPSY Right 05/27/2016  . MASTECTOMY W/ SENTINEL NODE BIOPSY Right 05/27/2016   Procedure: RIGHT TOTAL MASTECTOMY WITH AXILLARY SENTINEL LYMPH NODE BIOPSY;  Surgeon: Excell Seltzer, MD;  Location: [place];  Service: General;  Laterality: Right;  . TONSILLECTOMY    . WISDOM TOOTH EXTRACTION  ~ 1985     ALLERGIES:  No Known Allergies   CURRENT MEDICATIONS:  Outpatient Encounter Prescriptions as of 08/08/2016  Medication Sig  . buPROPion (WELLBUTRIN SR) 100 MG 12 hr tablet Take 1 tablet (100 mg total) by mouth daily.  . cholecalciferol (VITAMIN D) 1000 UNITS tablet Take 2,000 Units by mouth daily.   . COD LIVER OIL PO Take 1 capsule by mouth daily.  Marland Kitchen ibuprofen (ADVIL,MOTRIN) 200 MG tablet Take 200 mg by mouth every 6 (six) hours as needed for mild pain.  . Melatonin 1 MG TABS Take 1 mg by mouth at bedtime as needed (sleep).  . metoprolol tartrate (LOPRESSOR) 25 MG tablet Take 0.5 tablets (12.5 mg total) by mouth 2 (two) times daily. May increase after 1 week to 1 tab po BID if blood pressure elevated. (Patient  taking differently: Take 25 mg by mouth 2 (two) times daily. May increase after 1 week to 1 tab po BID if blood pressure elevated.)  . Omega-3 Fatty Acids (FISH OIL PO) Take 1 capsule by mouth daily.   . sertraline (ZOLOFT) 100 MG tablet Take 150 mg by mouth daily.   No facility-administered encounter medications on file as of 08/08/2016.      ONCOLOGIC FAMILY HISTORY:  Family History  Problem Relation Age of Onset  . Hypertension Mother   . Depression Mother   . Hyperlipidemia Mother   . Heart disease Mother   . Prostate cancer Father 16  . Mental illness Sister   . Hypertension Sister   . Prostate cancer Brother     dx in his late 3s  . Mental illness Maternal  Grandmother   . Breast cancer Maternal Grandmother     dx in her 16s  . Lung cancer Maternal Grandmother     smoker since age 8; dx in her 54s  . Cancer Maternal Grandfather   . Heart disease Maternal Grandfather   . Mental illness Maternal Grandfather   . Lung cancer Paternal Grandmother     dx in her 6s, life long smoker  . Prostate cancer Paternal Grandfather   . Allergies Daughter   . Anxiety disorder Daughter   . Melanoma Brother 23  . Bladder Cancer Sister 81     GENETIC COUNSELING/TESTING: 05/12/16-Genetic testing: Negative. Genes analyzed: ATM, BAP1, BARD1, BRCA1, BRCA2, BRIP1, CDH1, CDK4, CDKN2A, CHEK2, EPCAM, FANCC, MITF, MLH1, MSH2, MSH6, NBN, PALB2, PMS2, PTEN, RAD51C, RAD51D, TP53, and XRCC2   SOCIAL HISTORY:  Caitlin Monroe is legally separated and lives in [place], [place].   She has 2 children; 1 biological daughter and a son who is adopted.   Ms. Birkland is in law school.  She denies any current tobacco, alcohol, or illicit drug use.  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     PHYSICAL EXAMINATION:  Vital Signs: Vitals:   08/08/16 1036 08/08/16 1109  BP: 126/86 126/86  Pulse: 84 84  Resp: 16 16  Temp: 97.4 F (36.3 C) 97.4 F (36.3 C)   Filed Weights   08/08/16  1036 08/08/16 1109  Weight: 184 lb 6.4 oz (83.6 kg) 184 lb 6.4 oz (83.6 kg)   General: Well-nourished, well-appearing female in no acute distress.  She is accompanied by a friend today.   HEENT: Head is normocephalic.  Pupils equal and reactive to light. Conjunctivae clear without exudate.  Sclerae anicteric. Oral mucosa is pink, moist.  Oropharynx is pink without lesions or erythema.  Lymph: No cervical, supraclavicular, or infraclavicular lymphadenopathy noted on palpation.  Cardiovascular: Regular rate and rhythm.Marland Kitchen Respiratory: Clear to auscultation bilaterally. Chest expansion symmetric; breathing non-labored.  GI: Abdomen soft and round; non-tender, non-distended. Bowel sounds normoactive.  GU: Deferred.  Neuro: No focal deficits. Steady gait.  Psych: Mood and affect normal and appropriate for situation.  Extremities: No edema. Skin: Warm and dry.  LABORATORY DATA:  None for this visit.  DIAGNOSTIC IMAGING:  None for this visit.      ASSESSMENT AND PLAN:  Ms.. Barile is a pleasant 50 y.o. female with Stage IIA right breast invasive lobular carcinoma, ER+/PR+/HER2-, diagnosed in 03/2016; treated with right mastectomy.  She will start anti-estrogen therapy with Tamoxifen in 08/2016.  She presents to the Survivorship Clinic for our initial meeting and routine follow-up post-completion of treatment for breast cancer.    1. Stage IIA right breast cancer:  Ms. Pettaway is continuing to recover from definitive treatment for breast cancer. She will follow-up with her medical oncologist, Dr. Burr Medico in 08/2016 with history and physical exam per surveillance protocol.  She will start her anti-estrogen therapy with Tamoxifen in 08/2016 as previously directed by Dr. Burr Medico. Though the incidence is low, there is an associated risk of endometrial cancer with anti-estrogen therapies like Tamoxifen.  Ms. Geerdes was encouraged to contact Dr. Burr Medico or myself with any vaginal bleeding while taking Tamoxifen.  Other side effects of Tamoxifen were again reviewed with her as well. Today, a comprehensive survivorship care plan and treatment summary was reviewed with the patient today detailing her breast cancer diagnosis, treatment course, potential late/long-term effects of treatment, appropriate follow-up care with recommendations for the future,  and patient education resources.  A copy of this summary, along with a letter will be sent to the patient's primary care provider via mail/fax/In Basket message after today's visit.    2. Depression: Continue to follow-up with psychiatrist in weaning off of Zoloft and starting Effexor XR.  Defer to psychiatry for their continued management and support.   3. At risk of right arm lymphedema:  Ms. Sachs did have sentinel lymph node biopsy with mastectomy surgery putting her at increased risk of lymphedema.  We discussed that prevention is best in terms of managing lymphedema.  I recommended she wear a lymphedema sleeve, particularly when flying on her upcoming trips, to help prevent lymphedema.  I provided her with a paper prescription for a right arm lymphedema compressive sleeve today.    4. Bone health:  Given Ms. Vinsant's age & history of breast cancer, she is at risk for bone demineralization. We do not have any available DEXA scan records available for review.  She understands that Tamoxifen often has the positive side effect of increasing bone density, when compared to aromatase inhibitors.  When she goes through menopause and is transitioned to an AI, she will be at higher risk for bone thinning. Therefore, I will defer any future bone mineral density testing to her medical oncologist or PCP, as clinically indicated. In the meantime, she was encouraged to increase her consumption of foods rich in calcium, as well as increase her weight-bearing activities.  She was given education on specific activities to promote bone health.  5. Cancer screening:  Due to Ms.  Harkleroad's history and her age, she should receive screening for skin cancers, colon cancer, and gynecologic cancers.  The information and recommendations are listed on the patient's comprehensive care plan/treatment summary and were reviewed in detail with the patient.    6. Health maintenance and wellness promotion: Ms. Lovett was encouraged to consume 5-7 servings of fruits and vegetables per day. We reviewed the "Nutrition Rainbow" handout, as well as the handout "Take Control of Your Health and Reduce Your Cancer Risk" from the [employer].  She was also encouraged to engage in moderate to vigorous exercise for 30 minutes per day most days of the week. We discussed the [employer] fitness program, which is designed for cancer survivors to help them become more physically fit after cancer treatments.  She was instructed to limit her alcohol consumption and continue to abstain from tobacco use.   7. Support services/counseling: It is not uncommon for this period of the patient's cancer care trajectory to be one of many emotions and stressors.  We discussed an opportunity for her to participate in the next session of [facility] ("Finding Your New Normal") support group series designed for patients after they have completed treatment.   Ms. Milbrath was encouraged to take advantage of our many other support services programs, support groups, and/or counseling in coping with her new life as a cancer survivor after completing anti-cancer treatment.  She was offered support today through active listening and expressive supportive counseling.  She was given information regarding our available services and encouraged to contact me with any questions or for help enrolling in any of our support group/programs.    Dispo:   -Return to cancer center to see Dr. Burr Medico in 08/2016.  -She is welcome to return back to the Survivorship Clinic at any time; no additional follow-up needed at this time.  -Consider  referral back to survivorship as a long-term survivor for  continued surveillance  A total of 50 minutes of face-to-face time was spent with this patient with greater than 50% of that time in counseling and care-coordination.   Mike Craze, NP Survivorship Program [place] [phone]   Note: PRIMARY CARE PROVIDER [name], [place] [phone]

## 2016-08-08 ENCOUNTER — Ambulatory Visit (HOSPITAL_BASED_OUTPATIENT_CLINIC_OR_DEPARTMENT_OTHER): Payer: PRIVATE HEALTH INSURANCE | Admitting: Adult Health

## 2016-08-08 VITALS — BP 126/86 | HR 84 | Temp 97.4°F | Resp 16 | Ht 69.0 in | Wt 184.4 lb

## 2016-08-08 DIAGNOSIS — F329 Major depressive disorder, single episode, unspecified: Secondary | ICD-10-CM | POA: Diagnosis not present

## 2016-08-08 DIAGNOSIS — Z170 Estrogen receptor positive status [ER+]: Secondary | ICD-10-CM

## 2016-08-08 DIAGNOSIS — C50411 Malignant neoplasm of upper-outer quadrant of right female breast: Secondary | ICD-10-CM | POA: Diagnosis not present

## 2016-08-13 ENCOUNTER — Encounter: Payer: Self-pay | Admitting: Adult Health

## 2016-08-26 ENCOUNTER — Ambulatory Visit (INDEPENDENT_AMBULATORY_CARE_PROVIDER_SITE_OTHER): Payer: PRIVATE HEALTH INSURANCE | Admitting: Physician Assistant

## 2016-08-26 VITALS — BP 118/80 | HR 87 | Temp 98.1°F | Ht 69.0 in | Wt 184.0 lb

## 2016-08-26 DIAGNOSIS — R319 Hematuria, unspecified: Secondary | ICD-10-CM | POA: Diagnosis not present

## 2016-08-26 DIAGNOSIS — R300 Dysuria: Secondary | ICD-10-CM | POA: Diagnosis not present

## 2016-08-26 DIAGNOSIS — N390 Urinary tract infection, site not specified: Secondary | ICD-10-CM

## 2016-08-26 LAB — POCT URINALYSIS DIP (MANUAL ENTRY)
Glucose, UA: NEGATIVE
NITRITE UA: NEGATIVE
PH UA: 5.5
PROTEIN UA: NEGATIVE
Spec Grav, UA: 1.025
Urobilinogen, UA: 0.2

## 2016-08-26 LAB — POC MICROSCOPIC URINALYSIS (UMFC): Mucus: ABSENT

## 2016-08-26 MED ORDER — NITROFURANTOIN MONOHYD MACRO 100 MG PO CAPS: 100.0000 mg | ORAL_CAPSULE | Freq: Two times a day (BID) | ORAL | 0 refills | Status: AC

## 2016-08-26 NOTE — Patient Instructions (Addendum)
  Make sure you drink lots of water. Take antibiotics as prescribed.  We will contact you with your urine culture results. Please let us know if your symptoms persist after treatment. Thank you for letting me participate in your health and well being.    IF you received an x-ray today, you will receive an invoice from [facility]. Please contact [facility] at [phone] with questions or concerns regarding your invoice.   IF you received labwork today, you will receive an invoice from [place]. Please contact [facility] at [phone] with questions or concerns regarding your invoice.   Our billing staff will not be able to assist you with questions regarding bills from these companies.  You will be contacted with the lab results as soon as they are available. The fastest way to get your results is to activate your My Chart account. Instructions are located on the last page of this paperwork. If you have not heard from [place] regarding the results in 2 weeks, please contact this office.      Urinary Tract Infection, Adult Introduction A urinary tract infection (UTI) is an infection of any part of the urinary tract. The urinary tract includes the:  Kidneys.  Ureters.  Bladder.  Urethra. These organs make, store, and get rid of pee (urine) in the body. Follow these instructions at home:  Take over-the-counter and prescription medicines only as told by your doctor.  If you were prescribed an antibiotic medicine, take it as told by your doctor. Do not stop taking the antibiotic even if you start to feel better.  Avoid the following drinks:  Alcohol.  Caffeine.  Tea.  Carbonated drinks.  Drink enough fluid to keep your pee clear or pale yellow.  Keep all follow-up visits as told by your doctor. This is important.  Make sure to:  Empty your bladder often and completely. Do not to hold pee for long periods of time.  Empty your bladder before and after  sex.  Wipe from front to back after a bowel movement if you are female. Use each tissue one time when you wipe. Contact a doctor if:  You have back pain.  You have a fever.  You feel sick to your stomach (nauseous).  You throw up (vomit).  Your symptoms do not get better after 3 days.  Your symptoms go away and then come back. Get help right away if:  You have very bad back pain.  You have very bad lower belly (abdominal) pain.  You are throwing up and cannot keep down any medicines or water. This information is not intended to replace advice given to you by your health care provider. Make sure you discuss any questions you have with your health care provider. Document Released: 01/28/2008 Document Revised: 01/17/2016 Document Reviewed: 07/02/2015  2017 Elsevier

## 2016-08-26 NOTE — Progress Notes (Signed)
08/26/2016 at 6:07 PM  [name] / DOB: 1966/03/02 / MRN: [id number]  The patient has Allergic rhinitis; Depression with anxiety; Depression; Seasonal affective disorder ([place]); Breast cancer of upper-outer quadrant of right female breast ([place]); Family history of breast cancer; Family history of prostate cancer; Genetic testing; and Breast cancer, right ([place]) on her problem list.  SUBJECTIVE  Caitlin Monroe is a 51 y.o. female who complains of dysuria, urinary frequency, urinary hesitancy, and abdominal pain x 2 days. She denies flank pain, genital irritation and vaginal discharge. Has tried cystex with mild relief of pain. Most recent UTI prior to this was 05/2016. She has recently changed sexual partners and has not been urinating after sexual intercourse. Had full STD testing recently and it was negative.   She  has a past medical history of Anxiety; Breast cancer of upper-outer quadrant of right female breast ([place]) (04/25/2016); Depression; Family history of breast cancer; Family history of prostate cancer; Hypertension; PONV (postoperative nausea and vomiting); and Seasonal allergies.    Medications reviewed and updated by myself where necessary, and exist elsewhere in the encounter.   Ms. [name] has No Known Allergies. She  reports that she quit smoking about 21 years ago. Her smoking use included Cigarettes. She has a 7.50 pack-year smoking history. She has never used smokeless tobacco. She reports that she does not drink alcohol or use drugs. She  reports that she currently engages in sexual activity. The patient  has a past surgical history that includes Tonsillectomy; Wisdom tooth extraction (~ 1985); Mastectomy complete / simple w/ sentinel node biopsy (Right, 05/27/2016); Foot neuroma surgery (Right, 2000?); Breast biopsy (03/2016); Abdominal hysterectomy (Aug 2011); and Mastectomy w/ sentinel node biopsy (Right, 05/27/2016).  Her family history includes Allergies in her daughter;  Anxiety disorder in her daughter; Bladder Cancer (age of onset: 48) in her sister; Breast cancer in her maternal grandmother; Cancer in her maternal grandfather; Depression in her mother; Heart disease in her maternal grandfather and mother; Hyperlipidemia in her mother; Hypertension in her mother and sister; Lung cancer in her maternal grandmother and paternal grandmother; Melanoma (age of onset: 53) in her brother; Mental illness in her maternal grandfather, maternal grandmother, and sister; Prostate cancer in her brother and paternal grandfather; Prostate cancer (age of onset: 7) in her father.  Review of Systems  Constitutional: Negative for chills and diaphoresis.  Gastrointestinal: Negative for abdominal pain, nausea and vomiting.    OBJECTIVE  Her  height is 5\' 9"  (1.753 m) and weight is 184 lb (83.5 kg). Her temperature is 98.1 F (36.7 C). Her blood pressure is 118/80 and her pulse is 87. Her oxygen saturation is 99%.  The patient's body mass index is 27.17 kg/m.  Physical Exam  Constitutional: She is oriented to person, place, and time. She appears well-developed and well-nourished.  HENT:  Head: Normocephalic.  Eyes: Conjunctivae are normal.  Neck: Normal range of motion.  Cardiovascular: Normal rate, regular rhythm and normal heart sounds.   Respiratory: Effort normal.  GI: Soft. Normal appearance and bowel sounds are normal. There is no tenderness. There is no CVA tenderness.  Neurological: She is alert and oriented to person, place, and time.  Skin: Skin is warm and dry.    Results for orders placed or performed in visit on 08/26/16 (from the past 24 hour(s))  POCT urinalysis dipstick     Status: Abnormal   Collection Time: 08/26/16  4:37 PM  Result Value Ref Range   Color, UA  yellow yellow   Clarity, UA clear clear   Glucose, UA negative negative   Bilirubin, UA small (A) negative   Ketones, POC UA small (15) (A) negative   Spec Grav, UA 1.025    Blood, UA small  (A) negative   pH, UA 5.5    Protein Ur, POC negative negative   Urobilinogen, UA 0.2    Nitrite, UA Negative Negative   Leukocytes, UA small (1+) (A) Negative  POCT Microscopic Urinalysis ([facility])     Status: Abnormal   Collection Time: 08/26/16  4:38 PM  Result Value Ref Range   WBC,UR,HPF,POC Moderate (A) None WBC/hpf   RBC,UR,HPF,POC Few (A) None RBC/hpf   Bacteria None None, Too numerous to count   Mucus Absent Absent   Epithelial Cells, UR Per Microscopy Few (A) None, Too numerous to count cells/hpf    ASSESSMENT & PLAN  Takeya was seen today for dysuria.  Diagnoses and all orders for this visit:  Dysuria -     POCT Microscopic Urinalysis ([facility]) -     POCT urinalysis dipstick  Urinary tract infection with hematuria, site unspecified -     Urine culture -     nitrofurantoin, macrocrystal-monohydrate, (MACROBID) 100 MG capsule; Take 1 capsule (100 mg total) by mouth 2 (two) times daily.    The patient was advised to call or come back to clinic if she does not see an improvement in symptoms, or worsens with the above plan.   Tenna Delaine, PA-C [facility] 08/26/2016 6:07 PM

## 2016-08-28 LAB — URINE CULTURE

## 2016-09-18 NOTE — Progress Notes (Signed)
[facility]  [phone]) [phone] [phone]  Clinic Follow Up Note   Patient Care Team: Caitlin Agreste, MD as PCP - General (Family Medicine) Caitlin Few, MD as Consulting Physician (Obstetrics and Gynecology) Caitlin Monroe, CNM as Midwife (Obstetrics and Gynecology) Caitlin Seltzer, MD as Consulting Physician (General Surgery) Caitlin Merle, MD as Consulting Physician (Hematology) Caitlin Pray, MD as Consulting Physician (Radiation Oncology) 09/19/2016  CHIEF COMPLAINT:  Follow up right breast cancer  Oncology History   Breast cancer of upper-outer quadrant of right female breast Caitlin Monroe)   Staging form: Breast, AJCC 7th Edition   - Clinical stage from 04/23/2016: Stage IA (T1c(m), N0, M0) - Signed by Caitlin Merle, MD on 04/30/2016   - Pathologic stage from 05/27/2016: Stage IIA (T2(m), N0, cM0) - Unsigned      Breast cancer of upper-outer quadrant of right female breast ([facility])   04/17/2016 Mammogram    Mammogram and US showed a 2.2cm cyst at 12:00, a 1.6cm lobulated mass at 9:00 and a 40m mass at 10:00, axilla was negative       04/23/2016 Initial Diagnosis    Breast cancer of upper-outer quadrant of right female breast ([facility]      04/23/2016 Initial Biopsy    Core needle biopsy of right breast masses at 10:00 and 9:30 positions both showed invasive and insitu mammary carcinoma       04/23/2016 Receptors her2    ER 90%+, PR 80-90%+, HER2-, KI67 10-15%      05/12/2016 Procedure    Genetic testing: Negative. Genes analyzed: ATM, BAP1, BARD1, BRCA1, BRCA2, BRIP1, CDH1, CDK4, CDKN2A, CHEK2, EPCAM, FANCC, MITF, MLH1, MSH2, MSH6, NBN, PALB2, PMS2, PTEN, RAD51C, RAD51D, TP53, and XRCC2      05/27/2016 Surgery    Right breast mastectomy and sentinel lymph node biopsy ([facility])      05/27/2016 Pathology Results    Right breast mastectomy showed multifocal invasive and in situ lobular carcinoma, 3.2, 0.9 and 0.9 cm, G1 margins were negative, 3 sentinel lymph  nodes were negative.       05/27/2016 Oncotype testing    RS 18, which predicts 10 year distant recurrence risk of 12% with tamoxifen (intermediate risk)        Anti-estrogen oral therapy    Tamoxifen 20 mg daily (recommended to start in 08/2016)        HISTORY OF PRESENTING ILLNESS (04/30/2016):  AMax Fickle51y.o. female is here because of her recently diagnosed right breast cancer. She is accompanied by her friend and sister to our multidisciplinary breast clinic today.  She felt a breast lump about a Monroe weeks ago. She denies any breast pain, skin change or nipple discharge. She had normal screening mammogram in April 2017. She was seen by her primary care physician for the breast mass, and underwent diagnostic mammogram and ultrasound on 04/17/2016, which showed a 2.2 cm cyst at 12:00 position, a 1.6 cm lobulated mass at 9:00 and a 6 mm mass at 10:00 position, ultrasound of the axilla was negative. She underwent ultrasound guided core needle biopsy of both right breast mass, which all showed invasive lobular carcinoma, ER/PR strongly positive, HER-2 negative.  She feels very well overall, denies any pain or other symptoms. She is physically active, has been quite anxious since her cancer diagnosis.  GYN HISTORY  Menarchal: 12 LMP: 89(hysrectomy)  Contraceptive: 10, stopped in 30's  HRT: none  G1P1: one daughter, 110yo   CURRENT THERAPY: pending adjuvant Tamoxifen  INTERIM HISTORY:  Caitlin Monroe returns for follow-up. She is clinically doing well, but has been having a lot of stress lately, due to his adopted a son who has psychiatric disorders and was hospitalized several times in the last winter. She is separated from her husband, and also takes care of her elderly parents. She has weaned her off Wellbutrin, and has reduced her Zoloft dose to 12.5 mg daily for the past 2 weeks, and the plan to stop it in a Monroe weeks. she denies any depression or mood swing lately. She denies any  other pain or other symptoms. She has not started tamoxifen yet, due to the concern of side effects.  MEDICAL HISTORY:  Past Medical History:  Diagnosis Date  . Anxiety   . Breast cancer of upper-outer quadrant of right female breast ([facility]) 04/25/2016  . Depression   . Family history of breast cancer   . Family history of prostate cancer   . Hypertension    "because of stress" (05/27/2016)  . PONV (postoperative nausea and vomiting)   . Seasonal allergies     SURGICAL HISTORY: Past Surgical History:  Procedure Laterality Date  . ABDOMINAL HYSTERECTOMY  Aug 2011   [facility] [employer] TAH for fibroids  . BREAST BIOPSY  03/2016  . FOOT NEUROMA SURGERY Right 2000?  Marland Kitchen MASTECTOMY COMPLETE / SIMPLE W/ SENTINEL NODE BIOPSY Right 05/27/2016  . MASTECTOMY W/ SENTINEL NODE BIOPSY Right 05/27/2016   Procedure: RIGHT TOTAL MASTECTOMY WITH AXILLARY SENTINEL LYMPH NODE BIOPSY;  Surgeon: Caitlin Seltzer, MD;  Location: [facility];  Service: General;  Laterality: Right;  . TONSILLECTOMY    . WISDOM TOOTH EXTRACTION  ~ 1985    SOCIAL HISTORY: Social History   Social History  . Marital status: Legally Separated    Spouse name: N/A  . Number of children: N/A  . Years of education: N/A   Occupational History  . Law Student    Social History Main Topics  . Smoking status: Former Smoker    Packs/day: 0.50    Years: 15.00    Types: Cigarettes    Quit date: 06/23/1995  . Smokeless tobacco: Never Used  . Alcohol use No     Comment: 05/27/2016 "nothing since 1996"  . Drug use: No     Comment: 05/27/2016 "nothing since 1996"  . Sexual activity: Yes   Other Topics Concern  . Not on file   Social History Narrative   Married. Education: [employer]. Exercise: Gym 2 times a week fit walking.                         FAMILY HISTORY: Family History  Problem Relation Age of Onset  . Hypertension Mother   . Depression Mother   . Hyperlipidemia Mother   . Heart disease Mother   . Prostate cancer Father  25  . Mental illness Sister   . Hypertension Sister   . Prostate cancer Brother     dx in his late 63s  . Mental illness Maternal Grandmother   . Breast cancer Maternal Grandmother     dx in her 47s  . Lung cancer Maternal Grandmother     smoker since age 43; dx in her 60s  . Cancer Maternal Grandfather   . Heart disease Maternal Grandfather   . Mental illness Maternal Grandfather   . Lung cancer Paternal Grandmother     dx in her 52s, life long smoker  . Prostate cancer Paternal Grandfather   .  Allergies Daughter   . Anxiety disorder Daughter   . Melanoma Brother 29  . Bladder Cancer Sister 36    ALLERGIES:  has No Known Allergies.  MEDICATIONS:  Current Outpatient Prescriptions  Medication Sig Dispense Refill  . Amphetamine-Dextroamphetamine (ADDERALL PO) Take 12.5 mg by mouth daily.    . cholecalciferol (VITAMIN D) 1000 UNITS tablet Take 2,000 Units by mouth daily.     . COD LIVER OIL PO Take 1 capsule by mouth daily.    Marland Kitchen ibuprofen (ADVIL,MOTRIN) 200 MG tablet Take 200 mg by mouth every 6 (six) hours as needed for mild pain.    . Lactobacillus Rhamnosus, GG, (CULTURELLE PO) Take by mouth.    . loratadine (CLARITIN) 10 MG tablet Take 10 mg by mouth daily.    . magnesium 30 MG tablet Take 500 mg by mouth daily.     . Multiple Vitamins-Minerals (MULTIVITAMIN WITH MINERALS) tablet Take 1 tablet by mouth daily.    . Omega-3 Fatty Acids (FISH OIL PO) Take 1 capsule by mouth daily.     . Phenazopyridine HCl (AZO TABS PO) Take by mouth.    . sertraline (ZOLOFT) 100 MG tablet Take 25 mg by mouth daily. Patient currently tapering off Zoloft    . tamoxifen (NOLVADEX) 10 MG tablet Take 1 tablet (10 mg total) by mouth daily. 30 tablet 0   No current facility-administered medications for this visit.     REVIEW OF SYSTEMS:   Constitutional: Denies fevers, chills or abnormal night sweats Eyes: Denies blurriness of vision, double vision or watery eyes Ears, nose, mouth, throat, and  face: Denies mucositis or sore throat Respiratory: Denies cough, dyspnea or wheezes Cardiovascular: Denies palpitation, chest discomfort or lower extremity swelling Gastrointestinal:  Denies nausea, heartburn or change in bowel habits Skin: Denies abnormal skin rashes Lymphatics: Denies new lymphadenopathy or easy bruising Neurological:Denies numbness, tingling or new weaknesses Behavioral/Psych: Mood is stable, no new changes  All other systems were reviewed with the patient and are negative.  PHYSICAL EXAMINATION: ECOG PERFORMANCE STATUS: 0 - Asymptomatic  Vitals:   09/19/16 1126  BP: (!) 131/91  Pulse: 96  Resp: 20  Temp: 98 F (36.7 Monroe)   Filed Weights   09/19/16 1126  Weight: 176 lb 11.2 oz (80.2 kg)    GENERAL:alert, no distress and comfortable SKIN: skin color, texture, turgor are normal, no rashes or significant lesions EYES: normal, conjunctiva are pink and non-injected, sclera clear OROPHARYNX:no exudate, no erythema and lips, buccal mucosa, and tongue normal  NECK: supple, thyroid normal size, non-tender, without nodularity LYMPH:  no palpable lymphadenopathy in the cervical, axillary or inguinal LUNGS: clear to auscultation and percussion with normal breathing effort HEART: regular rate & rhythm and no murmurs and no lower extremity edema ABDOMEN:abdomen soft, non-tender and normal bowel sounds Musculoskeletal:no cyanosis of digits and no clubbing  PSYCH: alert & oriented x 3 with fluent speech NEURO: no focal motor/sensory deficits Breasts: Breast inspection showed them to be symmetrical with no nipple discharge.  right breast is surgically absent, the incision is healing well, no discharge or skin Discoloration.  Palpation of the left beast and axilla revealed no obvious mass that I could appreciate.   LABORATORY DATA:  I have reviewed the data as listed CBC Latest Ref Rng & Units 09/19/2016 06/24/2016 06/19/2016  WBC 3.9 - 10.3 10e3/uL 7.8 8.4 9.9  Hemoglobin  11.6 - 15.9 g/dL 15.4 13.5 13.4  Hematocrit 34.8 - 46.6 % 45.3 39.5 38.6  Platelets 145 - 400  10e3/uL 340 324 333   CMP Latest Ref Rng & Units 09/19/2016 06/24/2016 06/19/2016  Glucose 70 - 140 mg/dl 95 87 95  BUN 7.0 - 26.0 mg/dL 13.8 16.1 11.5  Creatinine 0.6 - 1.1 mg/dL 1.0 0.8 0.8  Sodium 136 - 145 mEq/L 140 140 140  Potassium 3.5 - 5.1 mEq/L 4.4 4.6 4.2  Chloride 101 - 111 mmol/L - - -  CO2 22 - 29 mEq/L 21(L) 24 25  Calcium 8.4 - 10.4 mg/dL 10.1 9.2 9.5  Total Protein 6.4 - 8.3 g/dL 8.0 7.3 7.4  Total Bilirubin 0.20 - 1.20 mg/dL 0.72 0.45 0.56  Alkaline Phos 40 - 150 U/L 49 61 60  AST 5 - 34 U/L '18 19 18  ' ALT 0 - 55 U/L '17 19 16   ' PATHOLOGY REPORT  Diagnosis 04/23/2016 1. Breast, right, needle core biopsy, 10 o'clock - INVASIVE AND IN SITU MAMMARY CARCINOMA. 2. Breast, right, needle core biopsy, 9:30 o'clock - INVASIVE AND IN SITU MAMMARY CARCINOMA.  Microscopic Comment 1. , 2. E-Cadherin and breast prognostic profile will be performed.  1. Results: IMMUNOHISTOCHEMICAL AND MORPHOMETRIC ANALYSIS PERFORMED MANUALLY Estrogen Receptor: 90%, POSITIVE, STRONG STAINING INTENSITY Progesterone Receptor: 80%, POSITIVE, STRONG STAINING INTENSITY Proliferation Marker Ki67: 10%  Results: HER2 - NEGATIVE RATIO OF HER2/CEP17 SIGNALS 1.60 AVERAGE HER2 COPY NUMBER PER CELL 2.00  2. PROGNOSTIC INDICATORS Results: IMMUNOHISTOCHEMICAL AND MORPHOMETRIC ANALYSIS PERFORMED MANUALLY Estrogen Receptor: 90%, POSITIVE, STRONG STAINING INTENSITY Progesterone Receptor: 90%, POSITIVE, STRONG STAINING INTENSITY Proliferation Marker Ki67: 15%  Results: HER2 - NEGATIVE RATIO OF HER2/CEP17 SIGNALS 1.36 AVERAGE HER2 COPY NUMBER PER CELL 1.70  Diagnosis 05/27/2016 1. Breast, simple mastectomy, Right - MULTIFOCAL INVASIVE AND IN SITU LOBULAR CARCINOMA, 3.2, 0.9, AND 0.9 CM. - MULTIFOCAL LOBULAR CARCINOMA IN SITU. - MARGINS NOT INVOLVED. - FIBROCYSTIC CHANGES WITH PSEUDOANGIOMATOUS STROMAL  HYPERPLASIA. - PREVIOUS BIOPSY SITES. 2. Lymph node, sentinel, biopsy, Right axillary #1 - ONE BENIGN LYMPH NODE (0/1). 3. Lymph node, sentinel, biopsy, Right axillary #2 - ONE BENIGN LYMPH NODE (0/1). 4. Lymph node, sentinel, biopsy, Right axillary #3 - ONE BENIGN LYMPH NODE (0/1). Microscopic Comment 1. BREAST, INVASIVE TUMOR, WITH LYMPH NODES PRESENT Specimen, including laterality and lymph node sampling (sentinel, non-sentinel): Right breast and three sentinel lymph nodes. Procedure: Mastectomy and sentinel lymph node biopsies. Histologic type: Lobular. Grade: 1 Tubule formation: 3 Nuclear pleomorphism: 1 Mitotic:1 Tumor size (gross measurement): 3.2, 0.9, and 0.9 cm Margins: Free of tumor. Invasive, distance to closest margin: 0.9 cm from anterior margin. In-situ, distance to closest margin: 0.9 cm from anterior margin. If margin positive, focally or broadly: N/A Lymphovascular invasion: Not identified. Ductal carcinoma in situ: No. Grade: N/A 1 of 4 FINAL for Waldvogel, Caitlin Monroe ([id number]) Microscopic Comment(continued) Extensive intraductal component: N/A Lobular neoplasia: Yes, lobular carcinoma in situ. Tumor focality: Multifocal. Extent of tumor: Skin: Free of tumor Nipple: Free of tumor. Skeletal muscle: Free of tumor. Lymph nodes: Examined: 3 Sentinel 0 Non-sentinel 3 Total Lymph nodes with metastasis: 0 Isolated tumor cells (< 0.2 mm): 0 Micrometastasis: (> 0.2 mm and < 2.0 mm): 0 Macrometastasis: (> 2.0 mm): 0 Extracapsular extension: N/A Breast prognostic profile: Case [id number]. Estrogen receptor: 90%, positive, strong staining. Progesterone receptor: 90%, positive, strong staining. Her 2 neu: Negative, ratio 1.36 and 1.6. Ki-67: 10% and 15%. Non-neoplastic breast: Fibrocystic changes and pseudoangiomatous stromal hyperplasia. TNM: pT2(multifocal), pN0, pMX Comments: There are three nodules of invasive and in situ lobular carcinoma which are 3.2,  0.9, and 0.9 cm. There are also  separate foci of lobular carcinoma in situ. The margins of the specimen are not involved by invasive or in situ lobular carcinoma. Immunohistochemistry for cytokeratin AE1/AE3 is performed on on all of the sentinel lymph nodes and no positivity is identified. (JDP:gt, 05/29/16)  Oncotype Dx RS 18, which predicts 10 year distant recurrence risk of 12% with tamoxifen  RADIOGRAPHIC STUDIES: I have personally reviewed the radiological images as listed and agreed with the findings in the report. No results found.  ASSESSMENT & PLAN:  51 y.o. Caucasian female, no significant past medical history, presented with a palpable right breast mass  1. Breast cancer of upper outer quadrant of right breast, invasive lobular carcinoma, mpT2N0M0, stage IIA, ER+/PR+/HER2-, G1, RS 18  ---I previously discussed her surgical path result in details. -She had multifocal disease, surgical margins were negative. Nodes were negative. -the Oncotype Dx result was reviewed with her in details. She has intermedia risk based on the recurrence score, which predicts 10 year distant recurrence after 5 years of tamoxifen 12%. The benefit of chemotherapy in the intermedia risk group is small and controversial. Given her relatively low score in the intermedia group, I did not recommend adjuvant chemotherapy. She agrees with the plan -Giving the strong ER and PR expression in her tumor, I recommend adjuvant endocrine therapy -Her FSH and estradiol level showed she is still premenopausal -I recommend her to start adjuvant tamoxifen to reduce her risk of cancer recurrence, especially distant recurrence. We reviewed her risk of recurrence based on her Oncotype DX test result, and the benefit of tamoxifen again. -She has weaned herself off Wellbutrin, and will be Zoloft in a Monroe weeks, she does not plan to take Effexor at this point. -I recommend her to start tamoxifen, due to her concern about side  effects, I recommend her to start at 10 mg daily for the first month, if she does well, will increase to 4 dose 20 million daily from second months. She agrees.  --The potential side effects, which includes but not limited to, hot flash, skin and vaginal dryness, slightly increased risk of cardiovascular disease and cataract, small risk of thrombosis and endometrial cancer, were discussed with her in great details. Preventive strategies for thrombosis, such as being physically active, using compression stocks, avoid cigarette smoking, etc., were reviewed with her. She had a hysterectomy, no risk of endometrial cancer. -Continue breast cancer surveillance. She is clinically doing well, exam is unremarkable, lab today CBC and CMP are normal, there is no clinical concern of cancer recurrence. -Continue annual screening mammogram of left breast, she is due in August -She has decided to have right breast reconstruction sometime this year. She saw Dr. Marica Otter   2. Bone health -We discussed the effect on bone density from tamoxifen or aromatase inhibitor -I encouraged her to take calcium and vitamin D  3. Hypertension, depression  -She has stopped Wellbutrin, and a wall stopped Zoloft in a Monroe weeks -I give her a prescription of Effexor, she does not want take it for now. -Continue follow-up with her primary care physician   Plan -We again discussed the benefits and risks of tamoxifen at length today. 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 If she takes the exam, she will start Tamoxifen afterwards.  -If she tolerates well, I'll change tamoxifen to 20 mg daily from second month -Lab and follow-up in 3 months.  No orders of the  defined types were placed in this encounter.   All questions were answered. The patient knows to call the clinic with any problems, questions or concerns. I spent 25 minutes counseling the  patient face to face. The total time spent in the appointment was 30 minutes and more than 50% was on counseling.    Caitlin Merle, MD 09/19/2016 11:48 AM

## 2016-09-19 ENCOUNTER — Encounter: Payer: Self-pay | Admitting: Hematology

## 2016-09-19 ENCOUNTER — Other Ambulatory Visit (HOSPITAL_BASED_OUTPATIENT_CLINIC_OR_DEPARTMENT_OTHER): Payer: Commercial Managed Care - PPO

## 2016-09-19 ENCOUNTER — Ambulatory Visit (HOSPITAL_BASED_OUTPATIENT_CLINIC_OR_DEPARTMENT_OTHER): Payer: Commercial Managed Care - PPO | Admitting: Hematology

## 2016-09-19 VITALS — BP 131/91 | HR 96 | Temp 98.0°F | Resp 20 | Ht 69.0 in | Wt 176.7 lb

## 2016-09-19 DIAGNOSIS — C50411 Malignant neoplasm of upper-outer quadrant of right female breast: Secondary | ICD-10-CM | POA: Diagnosis not present

## 2016-09-19 DIAGNOSIS — F32A Depression, unspecified: Secondary | ICD-10-CM

## 2016-09-19 DIAGNOSIS — Z170 Estrogen receptor positive status [ER+]: Secondary | ICD-10-CM

## 2016-09-19 DIAGNOSIS — F329 Major depressive disorder, single episode, unspecified: Secondary | ICD-10-CM | POA: Diagnosis not present

## 2016-09-19 LAB — COMPREHENSIVE METABOLIC PANEL
ALBUMIN: 4.7 g/dL (ref 3.5–5.0)
ALT: 17 U/L (ref 0–55)
AST: 18 U/L (ref 5–34)
Alkaline Phosphatase: 49 U/L (ref 40–150)
Anion Gap: 11 mEq/L (ref 3–11)
BUN: 13.8 mg/dL (ref 7.0–26.0)
CALCIUM: 10.1 mg/dL (ref 8.4–10.4)
CHLORIDE: 108 meq/L (ref 98–109)
CO2: 21 mEq/L — ABNORMAL LOW (ref 22–29)
CREATININE: 1 mg/dL (ref 0.6–1.1)
EGFR: 69 mL/min/{1.73_m2} — ABNORMAL LOW (ref >90)
GLUCOSE: 95 mg/dL (ref 70–140)
Potassium: 4.4 mEq/L (ref 3.5–5.1)
Sodium: 140 mEq/L (ref 136–145)
Total Bilirubin: 0.72 mg/dL (ref 0.20–1.20)
Total Protein: 8 g/dL (ref 6.4–8.3)

## 2016-09-19 LAB — CBC WITH DIFFERENTIAL/PLATELET
BASO%: 0.3 % (ref 0.0–2.0)
BASOS ABS: 0 10*3/uL (ref 0.0–0.1)
EOS ABS: 0.1 10*3/uL (ref 0.0–0.5)
EOS%: 1.1 % (ref 0.0–7.0)
HCT: 45.3 % (ref 34.8–46.6)
HGB: 15.4 g/dL (ref 11.6–15.9)
LYMPH%: 24 % (ref 14.0–49.7)
MCH: 32.5 pg (ref 25.1–34.0)
MCHC: 34.1 g/dL (ref 31.5–36.0)
MCV: 95.4 fL (ref 79.5–101.0)
MONO#: 0.4 10*3/uL (ref 0.1–0.9)
MONO%: 4.9 % (ref 0.0–14.0)
NEUT%: 69.7 % (ref 38.4–76.8)
NEUTROS ABS: 5.4 10*3/uL (ref 1.5–6.5)
PLATELETS: 340 10*3/uL (ref 145–400)
RBC: 4.74 10*6/uL (ref 3.70–5.45)
RDW: 12.6 % (ref 11.2–14.5)
WBC: 7.8 10*3/uL (ref 3.9–10.3)
lymph#: 1.9 10*3/uL (ref 0.9–3.3)

## 2016-09-19 MED ORDER — TAMOXIFEN CITRATE 10 MG PO TABS: 10.0000 mg | ORAL_TABLET | Freq: Every day | ORAL | 0 refills | Status: DC

## 2016-10-01 ENCOUNTER — Telehealth: Payer: Self-pay | Admitting: Hematology

## 2016-10-01 NOTE — Telephone Encounter (Signed)
Spoke with patient and confirmed the 5/4 appointments

## 2016-11-15 ENCOUNTER — Encounter (HOSPITAL_COMMUNITY): Payer: Self-pay

## 2016-12-07 IMAGING — MR MR BILATERAL BREAST WITHOUT AND WITH CONTRAST
7 of 12 series · 30 of 48 positions shown · IV contrast (multihance)
Comparison: No prior MRI.

CLINICAL DATA: Recent biopsy-proven multifocal breast cancer
involving the upper outer quadrant of the right breast. Pathology
invasive and in-situ mammary carcinoma. Pretreatment evaluation.

LABS:  Not applicable.
EXAM:
BILATERAL BREAST MRI WITH AND WITHOUT CONTRAST
TECHNIQUE: Multiplanar, multisequence MR images of both breasts were obtained
prior to and following the intravenous administration of 17 ml of
MultiHance.

[Series 2: t2_tirm_tra ipat (a-p) · axial · 3.0mm · 0.70mm/px · 1 of 59 slices shown]
[im 1/59]
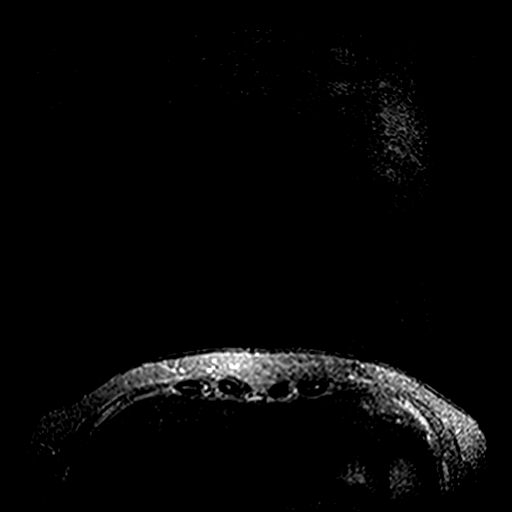

[Series 3: fl3d pre-cm no · axial · non-contrast · 1.2mm · 0.94mm/px · z∈[-90,+82]mm · 5 of 144 slices shown]
[im 1/144]
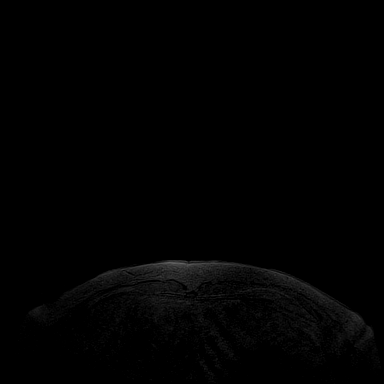
[im 36/144]
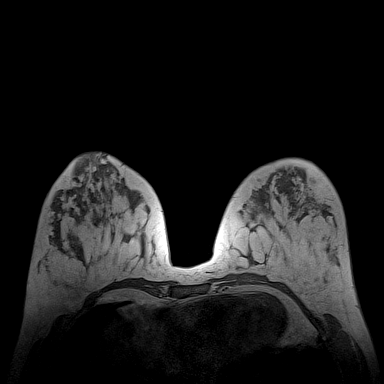
[im 72/144]
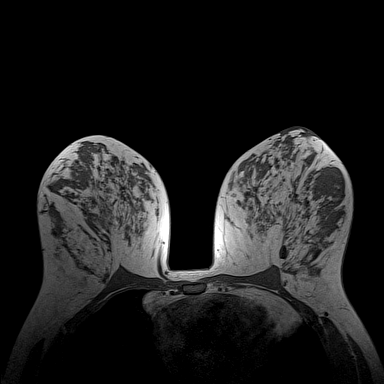
[im 108/144]
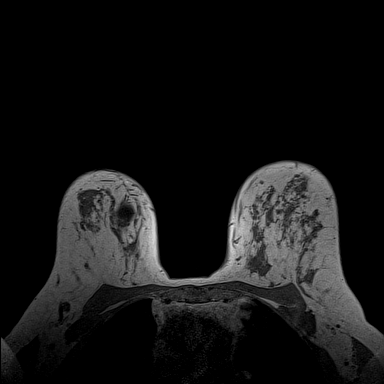
[im 144/144]
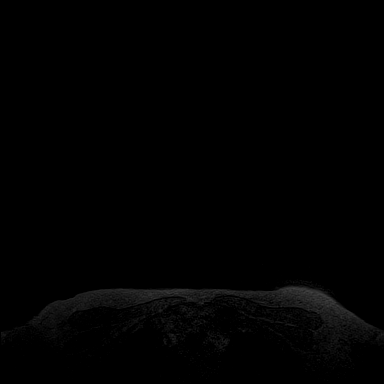

[Series 4: fl3d pre-cm · axial · non-contrast · 1.2mm · 0.94mm/px · z∈[-90,+82]mm · 5 of 144 slices shown]
[im 1/144]
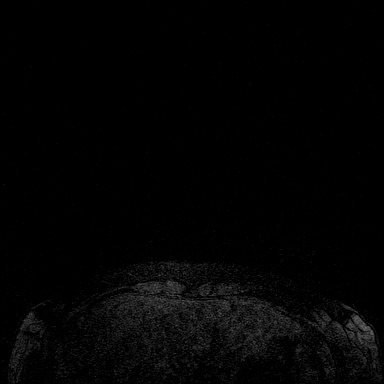
[im 36/144]
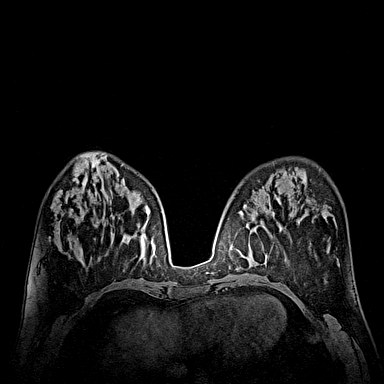
[im 72/144]
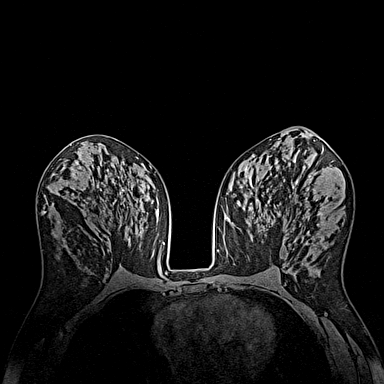
[im 108/144]
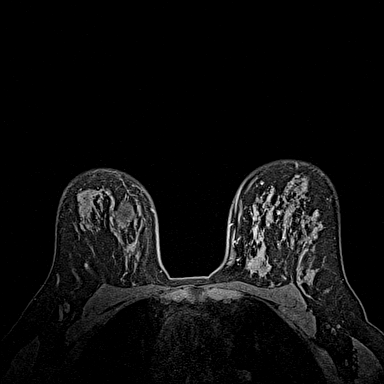
[im 144/144]
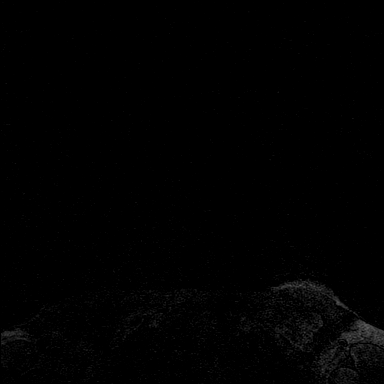

[Series 5: fl3d post-cm 20 · axial · 1.2mm · 0.94mm/px · z∈[-90,+82]mm · 5 of 144 slices shown]
[im 1/144]
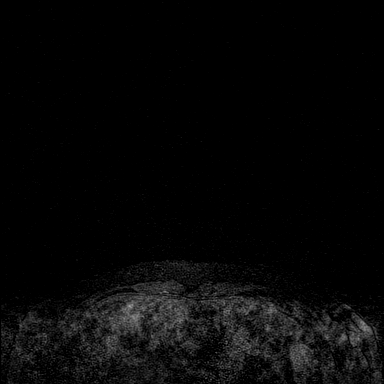
[im 36/144]
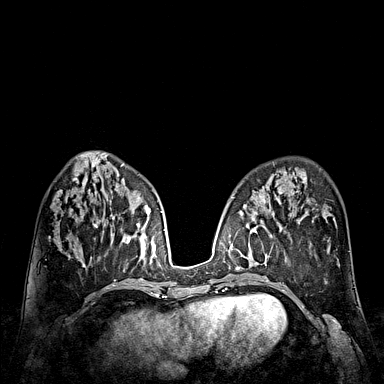
[im 72/144]
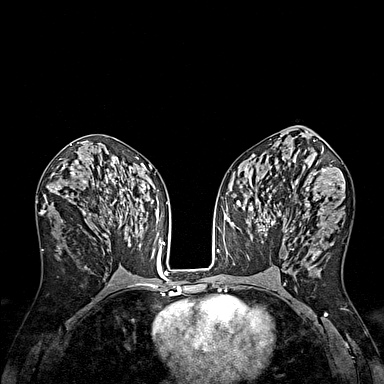
[im 108/144]
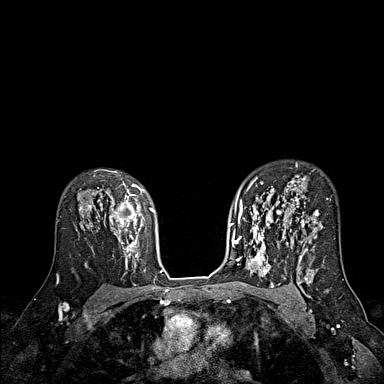
[im 144/144]
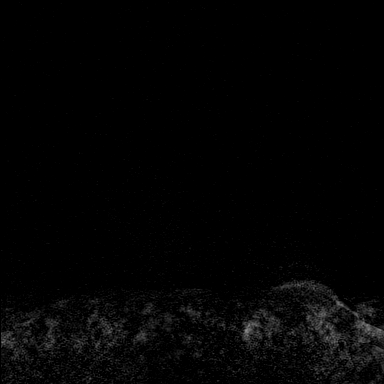

[Series 6: fl3d post-cm 3min · axial · 1.2mm · 0.94mm/px · z∈[-90,+82]mm · 5 of 144 slices shown]
[im 1/144]
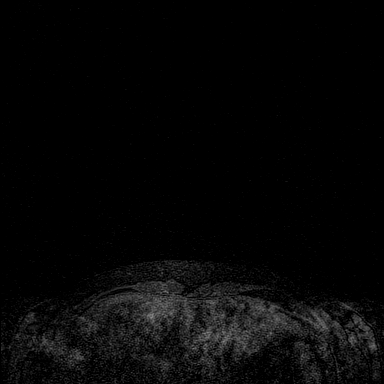
[im 36/144]
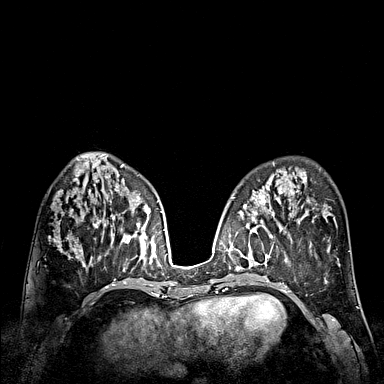
[im 72/144]
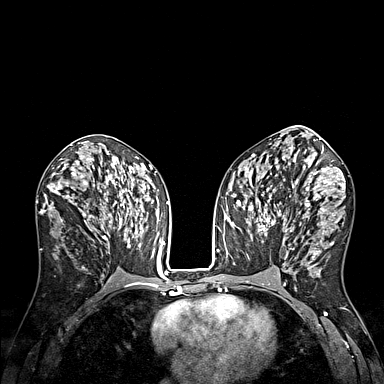
[im 108/144]
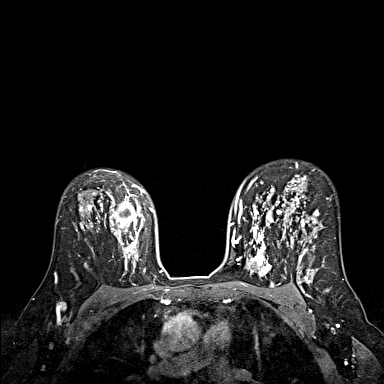
[im 144/144]
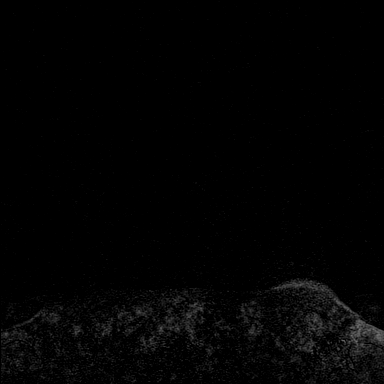

[Series 7: fl3d post-cm 5min · axial · 1.2mm · 0.94mm/px · z∈[-90,+82]mm · 6 of 144 slices shown]
[im 1/144]
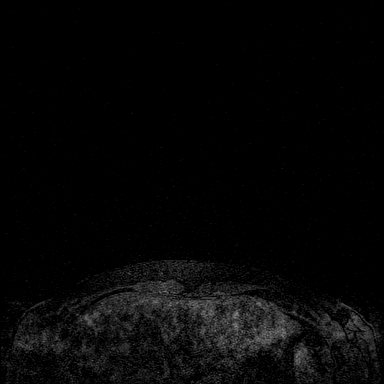
[im 29/144]
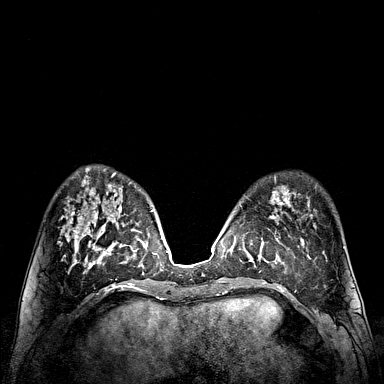
[im 58/144]
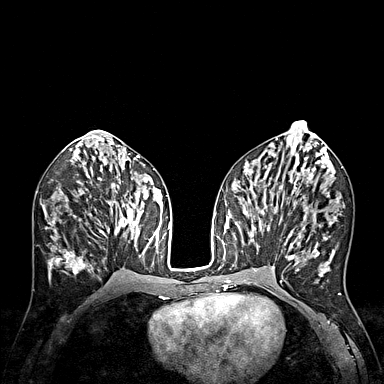
[im 86/144]
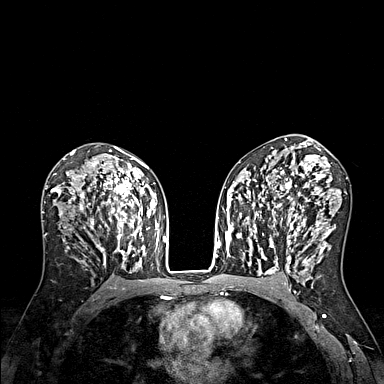
[im 115/144]
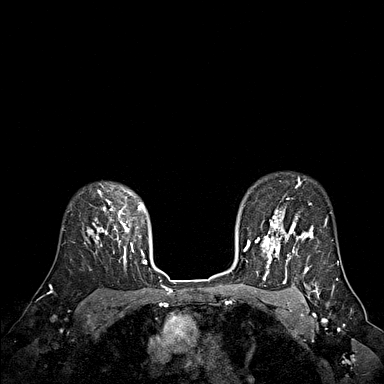
[im 144/144]
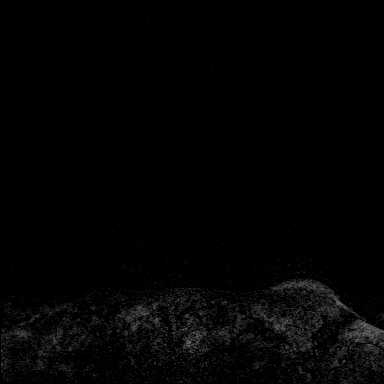

[Series 8: sub_(id) · axial · 1.2mm · 0.94mm/px · z∈[-90,-22]mm · 3 of 144 slices shown]
[im 1/144]
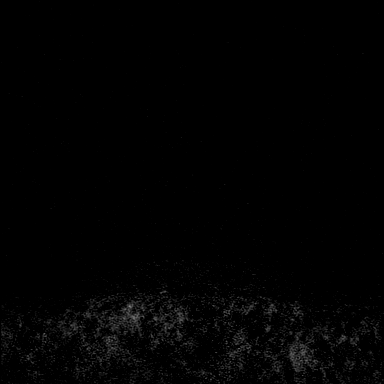
[im 29/144]
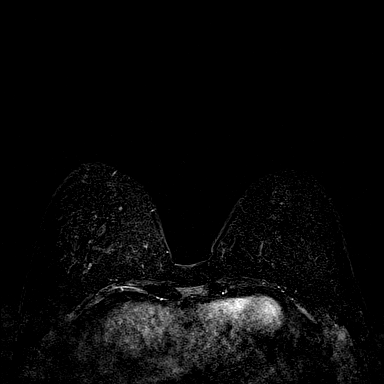
[im 58/144]
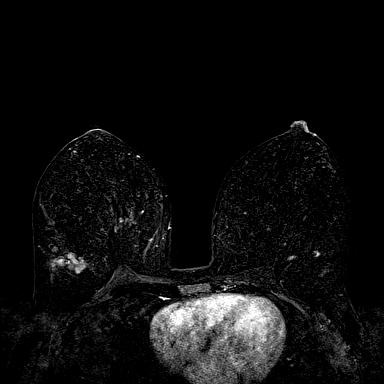

[30 of 48 positions shown; findings below may reference images not displayed]

THREE-DIMENSIONAL MR IMAGE RENDERING ON INDEPENDENT WORKSTATION:

Three-dimensional MR images were rendered by post-processing of the
original MR data on an independent workstation. The
three-dimensional MR images were interpreted, and findings are
reported in the following complete MRI report for this study. Three
dimensional images were evaluated at the independent [employer]
workstation.
Right mammography 04/23/2016, 04/17/2016,
12/11/2015 and bilateral mammography 12/22/2011 and earlier. Right
breast ultrasound 04/23/2016, 04/17/2016, 12/11/2015. Prior [facility] at [facility].
FINDINGS: Breast composition: d. Extreme fibroglandular tissue.

Background parenchymal enhancement: Moderate.

Right breast: Enhancing mass involving the outer right breast at
posterior depth, corresponding to the biopsy-proven malignancy at
the 9:30 o'clock position, measuring approximately 2.9 x 3.8 x
cm, demonstrating washout kinetics. Blooming artifact from the
tissue marker clip is present within the lateral portion of the
mass.

Enhancing mass involving the inner right breast at middle depth,
approximately 2:30 o'clock position, measuring approximately 1.7 x
1.2 x 0.8 cm, demonstrating washout kinetics.

Non mass enhancement involving a large segment of the upper inner
quadrant of the right breast extending from anterior to posterior
depth. The enhancement measures in total approximately 9.1 x 2.4 x
2.4 cm, demonstrating plateau kinetics.

Previously identified dominant cyst involving the upper inner
quadrant of the right breast with enhancement surrounding the cyst.
The cyst is at the superior margin of the non mass enhancement
involving the upper inner quadrant.

Multiple benign cysts elsewhere in the right breast.

Left breast: No mass or abnormal enhancement. Multiple benign cysts
throughout the left breast.

Lymph nodes: No abnormal appearing lymph nodes.

Ancillary findings:  None.
IMPRESSION: 1. Biopsy-proven malignancy involving the outer right breast at
posterior depth corresponding to the mass at the 9:30 o'clock
position on prior ultrasound. Maximum MRI measurement 3.8 cm.
2. Enhancing mass involving the slightly inner right breast at
middle depth, approximately 2:30 o'clock position, maximum
measurement 1.7 cm.
3. Segmental non mass enhancement involving the upper inner quadrant
of the right breast extending from anterior to posterior depth.
4. Enhancement surrounding the previously identified dominant cyst
in the upper inner quadrant of the right breast. This may be due to
chronic inflammation.
5. No MRI evidence of malignancy involving the left breast.
6. No pathologic lymphadenopathy.

RECOMMENDATION:
If breast conservation therapy is contemplated, MRI guided biopsy of
the mass in the slightly inner right breast at middle depth and MRI
guided biopsy of the non mass enhancement involving the upper inner
quadrant of the right breast is recommended.

BI-RADS CATEGORY  4: Suspicious.

## 2016-12-08 ENCOUNTER — Telehealth: Payer: Self-pay | Admitting: Hematology

## 2016-12-08 NOTE — Telephone Encounter (Signed)
I have ask Thu to reschedule her appointment to before 5/1.   [name] MD

## 2016-12-08 NOTE — Telephone Encounter (Signed)
Patient called and needs to schedule an appointment with Dr Burr Medico.  She has paperwork that needs to be filled out for [facility] testing.  The tamoxifen that she takes make her head foggy and she wanted to know what she could do for that.  She has an apppointment on 5/4 but the paper work has to be received by 5/1   Patient's call back number is [phone]

## 2016-12-09 ENCOUNTER — Telehealth: Payer: Self-pay | Admitting: Hematology

## 2016-12-09 NOTE — Telephone Encounter (Signed)
Called patient to inform her of next scheduled appointments scheduled for 4.20.18. Patient aware.

## 2016-12-12 ENCOUNTER — Ambulatory Visit (HOSPITAL_BASED_OUTPATIENT_CLINIC_OR_DEPARTMENT_OTHER): Payer: Commercial Managed Care - PPO | Admitting: Hematology

## 2016-12-12 ENCOUNTER — Encounter: Payer: Self-pay | Admitting: Hematology

## 2016-12-12 ENCOUNTER — Telehealth: Payer: Self-pay | Admitting: Hematology

## 2016-12-12 ENCOUNTER — Other Ambulatory Visit (HOSPITAL_BASED_OUTPATIENT_CLINIC_OR_DEPARTMENT_OTHER): Payer: Commercial Managed Care - PPO

## 2016-12-12 VITALS — BP 139/88 | HR 88 | Temp 98.2°F | Resp 18 | Ht 69.0 in | Wt 173.1 lb

## 2016-12-12 DIAGNOSIS — C50411 Malignant neoplasm of upper-outer quadrant of right female breast: Secondary | ICD-10-CM

## 2016-12-12 DIAGNOSIS — I10 Essential (primary) hypertension: Secondary | ICD-10-CM

## 2016-12-12 DIAGNOSIS — Z170 Estrogen receptor positive status [ER+]: Secondary | ICD-10-CM

## 2016-12-12 DIAGNOSIS — F329 Major depressive disorder, single episode, unspecified: Secondary | ICD-10-CM

## 2016-12-12 LAB — COMPREHENSIVE METABOLIC PANEL
ALBUMIN: 4.2 g/dL (ref 3.5–5.0)
ALK PHOS: 46 U/L (ref 40–150)
ALT: 17 U/L (ref 0–55)
AST: 22 U/L (ref 5–34)
Anion Gap: 12 mEq/L — ABNORMAL HIGH (ref 3–11)
BILIRUBIN TOTAL: 0.65 mg/dL (ref 0.20–1.20)
BUN: 14 mg/dL (ref 7.0–26.0)
CALCIUM: 9.1 mg/dL (ref 8.4–10.4)
CO2: 24 mEq/L (ref 22–29)
Chloride: 105 mEq/L (ref 98–109)
Creatinine: 0.8 mg/dL (ref 0.6–1.1)
EGFR: 87 mL/min/{1.73_m2} — AB (ref >90)
GLUCOSE: 94 mg/dL (ref 70–140)
POTASSIUM: 4.1 meq/L (ref 3.5–5.1)
SODIUM: 140 meq/L (ref 136–145)
TOTAL PROTEIN: 7.1 g/dL (ref 6.4–8.3)

## 2016-12-12 LAB — CBC WITH DIFFERENTIAL/PLATELET
BASO%: 0.2 % (ref 0.0–2.0)
BASOS ABS: 0 10*3/uL (ref 0.0–0.1)
EOS ABS: 0.1 10*3/uL (ref 0.0–0.5)
EOS%: 2.2 % (ref 0.0–7.0)
HEMATOCRIT: 40.3 % (ref 34.8–46.6)
HEMOGLOBIN: 13.9 g/dL (ref 11.6–15.9)
LYMPH#: 1.6 10*3/uL (ref 0.9–3.3)
LYMPH%: 25.4 % (ref 14.0–49.7)
MCH: 32.6 pg (ref 25.1–34.0)
MCHC: 34.4 g/dL (ref 31.5–36.0)
MCV: 95 fL (ref 79.5–101.0)
MONO#: 0.3 10*3/uL (ref 0.1–0.9)
MONO%: 5.6 % (ref 0.0–14.0)
NEUT%: 66.6 % (ref 38.4–76.8)
NEUTROS ABS: 4.2 10*3/uL (ref 1.5–6.5)
Platelets: 356 10*3/uL (ref 145–400)
RBC: 4.25 10*6/uL (ref 3.70–5.45)
RDW: 12.8 % (ref 11.2–14.5)
WBC: 6.2 10*3/uL (ref 3.9–10.3)

## 2016-12-12 MED ORDER — TAMOXIFEN CITRATE 20 MG PO TABS
20.0000 mg | ORAL_TABLET | Freq: Every day | ORAL | 2 refills | Status: DC
Start: 2016-12-12 — End: 2017-02-27

## 2016-12-12 NOTE — Telephone Encounter (Signed)
Gave patient avs report and appointments for August. Patient will contact [facility] re annual mammo due in August. Per patient she would like to be seen end of August after her mammo is done.

## 2016-12-12 NOTE — Progress Notes (Signed)
[place]  [phone] [phone]  Clinic Follow Up Note   Patient Care Team: [name], MD as PCP - General (Family Medicine) Brien Few, MD as Consulting Physician (Obstetrics and Gynecology) Artelia Laroche, CNM as Midwife (Obstetrics and Gynecology) Excell Seltzer, MD as Consulting Physician (General Surgery) [name], MD as Consulting Physician (Hematology) Gery Pray, MD as Consulting Physician (Radiation Oncology) 12/12/2016  CHIEF COMPLAINT:  Follow up right breast cancer  Oncology History   Breast cancer of upper-outer quadrant of right female breast [facility])   Staging form: Breast, AJCC 7th Edition   - Clinical stage from 04/23/2016: Stage IA (T1c(m), N0, M0) - Signed by [name], MD on 04/30/2016   - Pathologic stage from 05/27/2016: Stage IIA (T2(m), N0, cM0) - Unsigned      Breast cancer of upper-outer quadrant of right female breast ([place])   04/17/2016 Mammogram    Mammogram and US showed a 2.2cm cyst at 12:00, a 1.6cm lobulated mass at 9:00 and a 54m mass at 10:00, axilla was negative       04/23/2016 Initial Diagnosis    Breast cancer of upper-outer quadrant of right female breast [place]      04/23/2016 Initial Biopsy    Core needle biopsy of right breast masses at 10:00 and 9:30 positions both showed invasive and insitu mammary carcinoma       04/23/2016 Receptors her2    ER 90%+, PR 80-90%+, HER2-, KI67 10-15%      05/12/2016 Procedure    Genetic testing: Negative. Genes analyzed: ATM, BAP1, BARD1, BRCA1, BRCA2, BRIP1, CDH1, CDK4, CDKN2A, CHEK2, EPCAM, FANCC, MITF, MLH1, MSH2, MSH6, NBN, PALB2, PMS2, PTEN, RAD51C, RAD51D, TP53, and XRCC2      05/27/2016 Surgery    Right breast mastectomy and sentinel lymph node biopsy ([place])      05/27/2016 Pathology Results    Right breast mastectomy showed multifocal invasive and in situ lobular carcinoma, 3.2, 0.9 and 0.9 cm, G1 margins were negative, 3 sentinel lymph  nodes were negative.       05/27/2016 Oncotype testing    RS 18, which predicts 10 year distant recurrence risk of 12% with tamoxifen (intermediate risk)       11/23/2016 -  Anti-estrogen oral therapy    Tamoxifen 20 mg daily, she started with 171mdaily for the first month         HISTORY OF PRESENTING ILLNESS (04/30/2016):  AbMax Fickle51o. female is here because of her recently diagnosed right breast cancer. She is accompanied by her friend and sister to our multidisciplinary breast clinic today.   She felt a breast lump about a few weeks ago. She denies any breast pain, skin change or nipple discharge. She had normal screening mammogram in April 2017. She was seen by her primary care physician for the breast mass, and underwent diagnostic mammogram and ultrasound on 04/17/2016, which showed a 2.2 cm cyst at 12:00 position, a 1.6 cm lobulated mass at 9:00 and a 6 mm mass at 10:00 position, ultrasound of the axilla was negative. She underwent ultrasound guided core needle biopsy of both right breast mass, which all showed invasive lobular carcinoma, ER/PR strongly positive, HER-2 negative.  She feels very well overall, denies any pain or other symptoms. She is physically active, has been quite anxious since her cancer diagnosis.  GYN HISTORY  Menarchal: 12 LMP: 4557hysrectomy)  Contraceptive: 10, stopped in 30's  HRT: none  G1P1: one daughter, 1020o  CURRENT THERAPY: adjuvant Tamoxifen 66m dialy, she started on 11/23/2016 and had 125mdaily for the first month   INTERIM HISTORY:  AbElyshiaeturns for follow-up. She started Tamoxifen a few weeks ago. She struggled after discontinuing Zoloft and wanted to balance out on the Effexor before beginning the Tamoxifen. She is doing well now. 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 She denies hot flashes. She understands her Tamoxifen dose  will increase with her refill. She reports intermittent twinges in her left breast which resolves on its own. She is not currently interested in breast reconstruction surgery.   MEDICAL HISTORY:  Past Medical History:  Diagnosis Date  . Anxiety   . Breast cancer of upper[place]outer quadrant of right female breast [place]  . Depression   . Family history of breast cancer   . Family history of prostate cancer   . Hypertension    "because of stress" (05/27/2016)  . PONV (postoperative nausea and vomiting)   . Seasonal allergies     SURGICAL HISTORY: Past Surgical History:  Procedure Laterality Date  . ABDOMINAL HYSTERECTOMY  Aug 2011   Da Vinci TAH for fibroids  . BREAST BIOPSY  03/2016  . FOOT NEUROMA SURGERY Right 2000?  . Marland KitchenASTECTOMY COMPLETE / SIMPLE W/ SENTINEL NODE BIOPSY Right 05/27/2016  . MASTECTOMY W/ SENTINEL NODE BIOPSY Right 05/27/2016   Procedure: RIGHT TOTAL MASTECTOMY WITH AXILLARY SENTINEL LYMPH NODE BIOPSY;  Surgeon: BeExcell SeltzerMD;  [facility];  Laterality: Right;  . TONSILLECTOMY    . WISDOM TOOTH EXTRACTION  ~ 1985    SOCIAL HISTORY: Social History   Social History  . Marital status: Legally Separated    Spouse name: N/A  . Number of children: N/A  . Years of education: N/A   Occupational History  . Law Student    Social History Main Topics  . Smoking status: Former Smoker    Packs/day: 0.50    Years: 15.00    Types: Cigarettes    Quit date: 06/23/1995  . Smokeless tobacco: Never Used  . Alcohol use No     Comment: 05/27/2016 "nothing since 1996"  . Drug use: No     Comment: 05/27/2016 "nothing since 1996"  . Sexual activity: Yes   Other Topics Concern  . Not on file   Social History Narrative   Married. Education: [employer]ercise: Gym 2 times a week fit walking.                         FAMILY HISTORY: Family History  Problem Relation Age of Onset  . Hypertension Mother   . Depression Mother   .  Hyperlipidemia Mother   . Heart disease Mother   . Prostate cancer Father 698. Mental illness Sister   . Hypertension Sister   . Prostate cancer Brother     dx in his late 4052s. Mental illness Maternal Grandmother   . Breast cancer Maternal Grandmother     dx in her 6070s. Lung cancer Maternal Grandmother     smoker since age 4866dx in her 7021s. Cancer Maternal Grandfather   . Heart disease Maternal Grandfather   . Mental illness Maternal Grandfather   . Lung cancer Paternal Grandmother     dx in her 7041slife long smoker  . Prostate cancer  Paternal Grandfather   . Allergies Daughter   . Anxiety disorder Daughter   . Melanoma Brother 29  . Bladder Cancer Sister 44    ALLERGIES:  has No Known Allergies.  MEDICATIONS:  Current Outpatient Prescriptions  Medication Sig Dispense Refill  . cholecalciferol (VITAMIN D) 1000 UNITS tablet Take 2,000 Units by mouth daily.     . COD LIVER OIL PO Take 1 capsule by mouth daily.    Marland Kitchen ibuprofen (ADVIL,MOTRIN) 200 MG tablet Take 200 mg by mouth every 6 (six) hours as needed for mild pain.    . Lactobacillus Rhamnosus, GG, (CULTURELLE PO) Take by mouth.    . loratadine (CLARITIN) 10 MG tablet Take 10 mg by mouth daily.    . magnesium 30 MG tablet Take 500 mg by mouth daily.     . Multiple Vitamins-Minerals (MULTIVITAMIN WITH MINERALS) tablet Take 1 tablet by mouth daily.    . Omega-3 Fatty Acids (FISH OIL PO) Take 1 capsule by mouth daily.     . Phenazopyridine HCl (AZO TABS PO) Take by mouth.    . tamoxifen (NOLVADEX) 10 MG tablet Take 1 tablet (10 mg total) by mouth daily. 30 tablet 0  . venlafaxine (EFFEXOR) 75 MG tablet Take 75 mg by mouth daily.    . tamoxifen (NOLVADEX) 20 MG tablet Take 1 tablet (20 mg total) by mouth daily. 30 tablet 2   No current facility-administered medications for this visit.     REVIEW OF SYSTEMS:   Constitutional: Denies fevers, chills or abnormal night sweats Eyes: Denies blurriness of vision, double  vision or watery eyes Ears, nose, mouth, throat, and face: Denies mucositis or sore throat Respiratory: Denies cough, dyspnea or wheezes Cardiovascular: Denies palpitation, chest discomfort or lower extremity swelling Gastrointestinal:  Denies nausea, heartburn or change in bowel habits Skin: Denies abnormal skin rashes (+) intermittent twinges in left breast Lymphatics: Denies new lymphadenopathy or easy bruising Neurological:Denies numbness, tingling or new weaknesses Behavioral/Psych: Mood is stable, no new changes  All other systems were reviewed with the patient and are negative.  PHYSICAL EXAMINATION: ECOG PERFORMANCE STATUS: 0 - Asymptomatic  Vitals:   12/12/16 0927  BP: 139/88  Pulse: 88  Resp: 18  Temp: 98.2 F (36.8 C)   Filed Weights   12/12/16 0927  Weight: 173 lb 1.6 oz (78.5 kg)    GENERAL:alert, no distress and comfortable SKIN: skin color, texture, turgor are normal, no rashes or significant lesions EYES: normal, conjunctiva are pink and non-injected, sclera clear OROPHARYNX:no exudate, no erythema and lips, buccal mucosa, and tongue normal  NECK: supple, thyroid normal size, non-tender, without nodularity LYMPH:  no palpable lymphadenopathy in the cervical, axillary or inguinal LUNGS: clear to auscultation and percussion with normal breathing effort HEART: regular rate & rhythm and no murmurs and no lower extremity edema ABDOMEN:abdomen soft, non-tender and normal bowel sounds Musculoskeletal:no cyanosis of digits and no clubbing  PSYCH: alert & oriented x 3 with fluent speech NEURO: no focal motor/sensory deficits Breasts: Breast exam showed no nipple discharge. Right breast is surgically absent, the incision is well healed, no palpable abnormalities, discharge or skin discoloration.  Palpation of the left beast and axilla revealed no obvious mass that I could appreciate.   LABORATORY DATA:  I have reviewed the data as listed CBC Latest Ref Rng & Units  12/12/2016 09/19/2016 06/24/2016  WBC 3.9 - 10.3 10e3/uL 6.2 7.8 8.4  Hemoglobin 11.6 - 15.9 g/dL 13.9 15.4 13.5  Hematocrit 34.8 - 46.6 % 40.3  45.3 39.5  Platelets 145 - 400 10e3/uL 356 340 324   CMP Latest Ref Rng & Units 12/12/2016 09/19/2016 06/24/2016  Glucose 70 - 140 mg/dl 94 95 87  BUN 7.0 - 26.0 mg/dL 14.0 13.8 16.1  Creatinine 0.6 - 1.1 mg/dL 0.8 1.0 0.8  Sodium 136 - 145 mEq/L 140 140 140  Potassium 3.5 - 5.1 mEq/L 4.1 4.4 4.6  Chloride 101 - 111 mmol/L - - -  CO2 22 - 29 mEq/L 24 21(L) 24  Calcium 8.4 - 10.4 mg/dL 9.1 10.1 9.2  Total Protein 6.4 - 8.3 g/dL 7.1 8.0 7.3  Total Bilirubin 0.20 - 1.20 mg/dL 0.65 0.72 0.45  Alkaline Phos 40 - 150 U/L 46 49 61  AST 5 - 34 U/L _0 ALT 0 - 55 U/L _1 PATHOLOGY REPORT  Diagnosis 04/23/2016 1. Breast, right, needle core biopsy, 10 o'clock - INVASIVE AND IN SITU MAMMARY CARCINOMA. 2. Breast, right, needle core biopsy, 9:30 o'clock - INVASIVE AND IN SITU MAMMARY CARCINOMA.  Microscopic Comment 1. , 2. E-Cadherin and breast prognostic profile will be performed.  1. Results: IMMUNOHISTOCHEMICAL AND MORPHOMETRIC ANALYSIS PERFORMED MANUALLY Estrogen Receptor: 90%, POSITIVE, STRONG STAINING INTENSITY Progesterone Receptor: 80%, POSITIVE, STRONG STAINING INTENSITY Proliferation Marker Ki67: 10%  Results: HER2 - NEGATIVE RATIO OF HER2/CEP17 SIGNALS 1.60 AVERAGE HER2 COPY NUMBER PER CELL 2.00  2. PROGNOSTIC INDICATORS Results: IMMUNOHISTOCHEMICAL AND MORPHOMETRIC ANALYSIS PERFORMED MANUALLY Estrogen Receptor: 90%, POSITIVE, STRONG STAINING INTENSITY Progesterone Receptor: 90%, POSITIVE, STRONG STAINING INTENSITY Proliferation Marker Ki67: 15%  Results: HER2 - NEGATIVE RATIO OF HER2/CEP17 SIGNALS 1.36 AVERAGE HER2 COPY NUMBER PER CELL 1.70  Diagnosis 05/27/2016 1. Breast, simple mastectomy, Right - MULTIFOCAL INVASIVE AND IN SITU LOBULAR CARCINOMA, 3.2, 0.9, AND 0.9 CM. - MULTIFOCAL LOBULAR CARCINOMA IN SITU. -  MARGINS NOT INVOLVED. - FIBROCYSTIC CHANGES WITH PSEUDOANGIOMATOUS STROMAL HYPERPLASIA. - PREVIOUS BIOPSY SITES. 2. Lymph node, sentinel, biopsy, Right axillary #1 - ONE BENIGN LYMPH NODE (0/1). 3. Lymph node, sentinel, biopsy, Right axillary #2 - ONE BENIGN LYMPH NODE (0/1). 4. Lymph node, sentinel, biopsy, Right axillary #3 - ONE BENIGN LYMPH NODE (0/1). Microscopic Comment 1. BREAST, INVASIVE TUMOR, WITH LYMPH NODES PRESENT Specimen, including laterality and lymph node sampling (sentinel, non-sentinel): Right breast and three sentinel lymph nodes. Procedure: Mastectomy and sentinel lymph node biopsies. Histologic type: Lobular. Grade: 1 Tubule formation: 3 Nuclear pleomorphism: 1 Mitotic:1 Tumor size (gross measurement): 3.2, 0.9, and 0.9 cm Margins: Free of tumor. Invasive, distance to closest margin: 0.9 cm from anterior margin. In-situ, distance to closest margin: 0.9 cm from anterior margin. If margin positive, focally or broadly: N/A Lymphovascular invasion: Not identified. Ductal carcinoma in situ: No. Grade: N/A 1 of 4 FINAL for Pellot, Michalene C ([id number]) Microscopic Comment(continued) Extensive intraductal component: N/A Lobular neoplasia: Yes, lobular carcinoma in situ. Tumor focality: Multifocal. Extent of tumor: Skin: Free of tumor Nipple: Free of tumor. Skeletal muscle: Free of tumor. Lymph nodes: Examined: 3 Sentinel 0 Non-sentinel 3 Total Lymph nodes with metastasis: 0 Isolated tumor cells (< 0.2 mm): 0 Micrometastasis: (> 0.2 mm and < 2.0 mm): 0 Macrometastasis: (> 2.0 mm): 0 Extracapsular extension: N/A Breast prognostic profile: Case [id number]. Estrogen receptor: 90%, positive, strong staining. Progesterone receptor: 90%, positive, strong staining. Her 2 neu: Negative, ratio 1.36 and 1.6. Ki-67: 10% and 15%. Non-neoplastic breast: Fibrocystic changes and pseudoangiomatous stromal hyperplasia. TNM: pT2(multifocal), pN0, pMX Comments:  There are three nodules of invasive and in situ lobular carcinoma which are 3.2,  0.9, and 0.9 cm. There are also separate foci of lobular carcinoma in situ. The margins of the specimen are not involved by invasive or in situ lobular carcinoma. Immunohistochemistry for cytokeratin AE1/AE3 is performed on on all of the sentinel lymph nodes and no positivity is identified. (JDP:gt, 05/29/16)  Oncotype Dx RS 18, which predicts 10 year distant recurrence risk of 12% with tamoxifen  RADIOGRAPHIC STUDIES: I have personally reviewed the radiological images as listed and agreed with the findings in the report. No results found.  ASSESSMENT & PLAN:  51 y.o. Caucasian female, no significant past medical history, presented with a palpable right breast mass  1. Breast cancer of upper outer quadrant of right breast, invasive lobular carcinoma, mpT2N0M0, stage IIA, ER+/PR+/HER2-, G1, RS 18  ---I previously discussed her surgical path result in details. -She had multifocal disease, surgical margins were negative. Nodes were negative. -the Oncotype Dx result was reviewed with her in details. She has intermedia risk based on the recurrence score, which predicts 10 year distant recurrence after 5 years of tamoxifen 12%. The benefit of chemotherapy in the intermedia risk group is small and controversial. Given her relatively low score in the intermedia group, I did not recommend adjuvant chemotherapy. She agrees with the plan -Giving the strong ER and PR expression in her tumor, I recommend adjuvant endocrine therapy -Her [place] and estradiol level showed she is still premenopausal -She has finally started adjuvant tamoxifen, tolerated low-dose 84m daily well. She is agreeable to increase to 221mdaily next month  -Continue breast cancer surveillance. She is clinically doing well, exam is unremarkable, lab today CBC and CMP are normal, there is no clinical concern of cancer recurrence. -Continue annual screening  mammogram of left breast, she is due in August -She is not interested in breast reconstruction surgery at this time. 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 I will fill out the paperwork for this. -I also spoke with the patient that it would be okay to stop Tamoxifen for 1-2 weeks before the bar exam, if needed.  2. Bone health -We discussed the effect on bone density from tamoxifen or aromatase inhibitor -I encouraged her to take calcium and vitamin D  3. Hypertension, depression  -She has stopped Wellbutrin, and has stopped Zoloft -She has started taking Effexor and is feeling well. -Continue follow-up with her primary care physician    Plan -We again discussed the benefits and risks of tamoxifen at length today. -I have refilled her Tamoxifen 20 mg today, she will increase to 2043maily after she completes one month of 22m3mily  -Mammogram due in August -Lab and follow-up in 3 months  Orders Placed This Encounter  Procedures  . MM DIAG BREAST TOMO UNI LEFT    Standing Status:   Future    Standing Expiration Date:   02/11/2018    Scheduling Instructions:     [place]    Order Specific Question:   Reason for Exam (SYMPTOM  OR DIAGNOSIS REQUIRED)    Answer:   screening    Order Specific Question:   Is the patient pregnant?    Answer:   No    Order Specific Question:   Preferred imaging location?    Answer:   External    All questions were answered. The patient knows to call the clinic with any problems, questions or concerns. I spent 25 minutes counseling  the patient face to face. The total time spent in the appointment was 30 minutes and more than 50% was on counseling.  This document serves as a record of services personally performed by [name], MD. It was created on her behalf by Arlyce Harman, a trained medical scribe. The creation of this record is based on the scribe's  personal observations and the provider's statements to them. This document has been checked and approved by the attending provider.   [name], MD 12/12/2016

## 2016-12-15 ENCOUNTER — Telehealth: Payer: Self-pay | Admitting: Hematology

## 2016-12-15 NOTE — Telephone Encounter (Signed)
Called patient on 12/12/16 to advise that completed forms were ready for her to pickup at the [employer] reception area. Scanned completed forms into Epic

## 2016-12-25 ENCOUNTER — Telehealth: Payer: Self-pay | Admitting: *Deleted

## 2016-12-25 NOTE — Telephone Encounter (Signed)
"  Tamoxifen refill was to be called in over a week ago.  [facility] on [address] has not received the order.  Please call this in and return my call [phone]."    Called [facility] to re-submit electronic order sent on 12-12-2016.  "We have it on her profile and will get it ready." Patient notified.  No further questions or needs at this time.

## 2016-12-26 ENCOUNTER — Ambulatory Visit: Payer: Commercial Managed Care - PPO | Admitting: Hematology

## 2016-12-26 ENCOUNTER — Other Ambulatory Visit: Payer: Commercial Managed Care - PPO

## 2017-02-24 ENCOUNTER — Other Ambulatory Visit: Payer: Self-pay | Admitting: Hematology

## 2017-02-27 ENCOUNTER — Other Ambulatory Visit: Payer: Self-pay | Admitting: *Deleted

## 2017-02-27 DIAGNOSIS — C50411 Malignant neoplasm of upper-outer quadrant of right female breast: Secondary | ICD-10-CM

## 2017-02-27 DIAGNOSIS — Z170 Estrogen receptor positive status [ER+]: Secondary | ICD-10-CM

## 2017-02-27 MED ORDER — TAMOXIFEN CITRATE 20 MG PO TABS: 20.0000 mg | ORAL_TABLET | Freq: Every day | ORAL | 2 refills | Status: DC

## 2017-03-05 ENCOUNTER — Telehealth: Payer: Self-pay | Admitting: *Deleted

## 2017-03-05 NOTE — Telephone Encounter (Signed)
Pt called stating that she will have Mammogram done in August prior to next office visit.  Pt would like to know if she could have MRI Breast done also - as discussed with Dr. Burr Medico at last office visit. Pt's    Phone      [phone].

## 2017-03-06 NOTE — Telephone Encounter (Signed)
Attempted to call pt to inform her of Dr. Ernestina Penna instructions.  No answer.  Unable to leave voice message due to mail box is full , and not accepting messages.

## 2017-03-06 NOTE — Telephone Encounter (Signed)
It will be reasonable to do breast MRI due to her dense breast tissue, but let her that her insurance may not cover it, and I will prefer her to do MRI in 3-6 months after mammogram. Thanks   [name] MD

## 2017-03-08 ENCOUNTER — Encounter (HOSPITAL_COMMUNITY): Payer: Self-pay | Admitting: Emergency Medicine

## 2017-03-08 ENCOUNTER — Ambulatory Visit (HOSPITAL_COMMUNITY)
Admission: EM | Admit: 2017-03-08 | Discharge: 2017-03-08 | Disposition: A | Discharge status: Home / Self Care | Payer: Commercial Managed Care - PPO | Attending: Internal Medicine | Admitting: Internal Medicine

## 2017-03-08 DIAGNOSIS — F329 Major depressive disorder, single episode, unspecified: Secondary | ICD-10-CM | POA: Insufficient documentation

## 2017-03-08 DIAGNOSIS — N766 Ulceration of vulva: Secondary | ICD-10-CM | POA: Diagnosis not present

## 2017-03-08 DIAGNOSIS — F419 Anxiety disorder, unspecified: Secondary | ICD-10-CM | POA: Diagnosis not present

## 2017-03-08 DIAGNOSIS — N3001 Acute cystitis with hematuria: Secondary | ICD-10-CM | POA: Diagnosis not present

## 2017-03-08 DIAGNOSIS — Z9011 Acquired absence of right breast and nipple: Secondary | ICD-10-CM | POA: Insufficient documentation

## 2017-03-08 DIAGNOSIS — Z87891 Personal history of nicotine dependence: Secondary | ICD-10-CM | POA: Diagnosis not present

## 2017-03-08 DIAGNOSIS — Z90710 Acquired absence of both cervix and uterus: Secondary | ICD-10-CM | POA: Diagnosis not present

## 2017-03-08 DIAGNOSIS — R300 Dysuria: Secondary | ICD-10-CM

## 2017-03-08 DIAGNOSIS — N949 Unspecified condition associated with female genital organs and menstrual cycle: Secondary | ICD-10-CM | POA: Diagnosis not present

## 2017-03-08 DIAGNOSIS — N898 Other specified noninflammatory disorders of vagina: Secondary | ICD-10-CM | POA: Diagnosis present

## 2017-03-08 DIAGNOSIS — Z853 Personal history of malignant neoplasm of breast: Secondary | ICD-10-CM | POA: Diagnosis not present

## 2017-03-08 DIAGNOSIS — R3989 Other symptoms and signs involving the genitourinary system: Secondary | ICD-10-CM

## 2017-03-08 LAB — POCT URINALYSIS DIP (DEVICE)
Bilirubin Urine: NEGATIVE
Glucose, UA: NEGATIVE mg/dL
Ketones, ur: NEGATIVE mg/dL
Nitrite: NEGATIVE
Protein, ur: NEGATIVE mg/dL
Specific Gravity, Urine: 1.015 (ref 1.005–1.030)
UROBILINOGEN UA: 0.2 mg/dL (ref 0.0–1.0)
pH: 5.5 (ref 5.0–8.0)

## 2017-03-08 MED ORDER — ACYCLOVIR 400 MG PO TABS: 400.0000 mg | ORAL_TABLET | Freq: Three times a day (TID) | ORAL | 0 refills | Status: AC

## 2017-03-08 MED ORDER — SULFAMETHOXAZOLE-TRIMETHOPRIM 800-160 MG PO TABS: 1.0000 | ORAL_TABLET | Freq: Two times a day (BID) | ORAL | 0 refills | Status: AC

## 2017-03-08 NOTE — ED Triage Notes (Signed)
Pt reports vaginal burning and itching on Wednesday.  She was prescribed Diflucan and she used Monistat.  Pt states these did not help.  Pt has noticed ulcers in her vaginal area and she is concerned for possible STD.  She does admit to unprotected sex with a long time boyfriend, but she has had breast cancer and is immunocompromised.  She has an appointment with her OB/GYN tomorrow but she states this is day 5 and is concerned she may need to have treatment starting today.

## 2017-03-08 NOTE — Discharge Instructions (Signed)
We did a swab for cytology to test for Chlamydia/Gonorrhea, Trichomonas, Candida, Bacterial vaginosis. We also did a swab to test for HSV. Your urine showed signs of an urinary tract infection. Start Bactrim DS twice a day for 3 days. Given history and exam, will treat you for HSV infection, Acyclovir 400mg  three times a day for 10 days. Follow up with your GYN for further workup. You will be called with any positive results, any additional treatment needed will be called in then.

## 2017-03-08 NOTE — ED Provider Notes (Signed)
CSN: [id number]     Arrival date & time 03/08/17  1413 History   None    Chief Complaint  Patient presents with  . Urinary Tract Infection  . Exposure to STD   (Consider location/radiation/quality/duration/timing/severity/associated sxs/prior Treatment) 51 year old female with PMH of breast cancer s/p mastectomy on Tamoxifen, fibroids s/p hysterectomy comes in with 5 day history of vaginal irritation and 3-4 day history of genital ulcers. She states there was vaginal irritation with white discharge when symptoms first started, thought it was yeast infection, and was treated with diflucan and monistat. Symptoms became worse, and she noticed ulcerations on her vulvar that is painful. She has also noticed dysuria, though unsure if it is due to the open wounds. Denies urinary frequency, hematuria. Patient is sexually active with unprotected sex with one partner of 8 months. [facility] which patient states includes HPV, STDs, HIV, syphilis, and were all negative. She has an appointment with GYN tomorrow. Denies abdominal pain, N/V/D.       Past Medical History:  Diagnosis Date  . Anxiety   . Breast cancer of upper-outer quadrant of right female breast ([place]) 04/25/2016  . Depression   . Family history of breast cancer   . Family history of prostate cancer   . Hypertension    "because of stress" (05/27/2016)  . PONV (postoperative nausea and vomiting)   . Seasonal allergies    Past Surgical History:  Procedure Laterality Date  . ABDOMINAL HYSTERECTOMY  Aug 2011   [employer] TAH for fibroids  . BREAST BIOPSY  03/2016  . FOOT NEUROMA SURGERY Right 2000?  [facility] Kitchen MASTECTOMY COMPLETE / SIMPLE W/ SENTINEL NODE BIOPSY Right 05/27/2016  . MASTECTOMY W/ SENTINEL NODE BIOPSY Right 05/27/2016   Procedure: RIGHT TOTAL MASTECTOMY WITH AXILLARY SENTINEL LYMPH NODE BIOPSY;  Surgeon: Excell Seltzer, MD;  Location: [facility];  Service: General;  Laterality:  Right;  . TONSILLECTOMY    . WISDOM TOOTH EXTRACTION  ~ 1985   Family History  Problem Relation Age of Onset  . Hypertension Mother   . Depression Mother   . Hyperlipidemia Mother   . Heart disease Mother   . Prostate cancer Father 40  . Mental illness Sister   . Hypertension Sister   . Prostate cancer Brother        dx in his late 20s  . Mental illness Maternal Grandmother   . Breast cancer Maternal Grandmother        dx in her 50s  . Lung cancer Maternal Grandmother        smoker since age 9; dx in her 86s  . Cancer Maternal Grandfather   . Heart disease Maternal Grandfather   . Mental illness Maternal Grandfather   . Lung cancer Paternal Grandmother        dx in her 63s, life long smoker  . Prostate cancer Paternal Grandfather   . Allergies Daughter   . Anxiety disorder Daughter   . Melanoma Brother 48  . Bladder Cancer Sister 49   Social History  Substance Use Topics  . Smoking status: Former Smoker    Packs/day: 0.50    Years: 15.00    Types: Cigarettes    Quit date: 06/23/1995  . Smokeless tobacco: Never Used  . Alcohol use No     Comment: 05/27/2016 "nothing since 1996"   OB History    No data available     Review of Systems  Constitutional: Negative for chills, diaphoresis and fever.  Genitourinary: Positive for dysuria, genital sores, vaginal discharge and vaginal pain. Negative for difficulty urinating, flank pain, frequency, hematuria, menstrual problem, pelvic pain, urgency and vaginal bleeding.    Allergies  Patient has no known allergies.  Home Medications   Prior to Admission medications   Medication Sig Start Date End Date Taking? Authorizing Provider  cholecalciferol (VITAMIN D) 1000 UNITS tablet Take 2,000 Units by mouth daily.    Yes [provider]  COD LIVER OIL PO Take 1 capsule by mouth daily.   Yes [provider]  loratadine (CLARITIN) 10 MG tablet Take 10 mg by mouth daily.   Yes [provider]   magnesium 30 MG tablet Take 500 mg by mouth daily.    Yes [provider]  Multiple Vitamins-Minerals (MULTIVITAMIN WITH MINERALS) tablet Take 1 tablet by mouth daily.   Yes [provider]  Omega-3 Fatty Acids (FISH OIL PO) Take 1 capsule by mouth daily.    Yes [provider]  tamoxifen (NOLVADEX) 20 MG tablet Take 1 tablet (20 mg total) by mouth daily. 02/27/17  Yes [name], MD  venlafaxine (EFFEXOR) 75 MG tablet Take 75 mg by mouth daily.   Yes [provider]  acyclovir (ZOVIRAX) 400 MG tablet Take 1 tablet (400 mg total) by mouth 3 (three) times daily. 03/08/17 03/18/17  Ok Edwards, PA-C  ibuprofen (ADVIL,MOTRIN) 200 MG tablet Take 200 mg by mouth every 6 (six) hours as needed for mild pain.    [provider]  Lactobacillus Rhamnosus, GG, (CULTURELLE PO) Take by mouth.    [provider]  Phenazopyridine HCl (AZO TABS PO) Take by mouth.    [provider]  sulfamethoxazole-trimethoprim (BACTRIM DS,SEPTRA DS) 800-160 MG tablet Take 1 tablet by mouth 2 (two) times daily. 03/08/17 03/11/17  Ok Edwards, PA-C   Meds Ordered and Administered this Visit  Medications - No data to display  BP (!) 147/98 (BP Location: Left Arm)   Pulse (!) 111   Temp 99.3 F (37.4 C) (Oral)  No data found.   Physical Exam  Constitutional: She is oriented to person, place, and time. She appears well-developed and well-nourished. No distress.  HENT:  Head: Normocephalic and atraumatic.  Eyes: Pupils are equal, round, and reactive to light. Conjunctivae are normal.  Cardiovascular: Normal rate, regular rhythm and normal heart sounds.  Exam reveals no gallop and no friction rub.   No murmur heard. Pulmonary/Chest: Effort normal and breath sounds normal. She has no wheezes. She has no rales.  Genitourinary:  Genitourinary Comments: Multiple sores on bilateral labia minora, painful on palpation.   Vagina with white discharge, patient s/p hysterectomy,  no cervix noted. No erythema, bleeding.   Neurological: She is alert and oriented to person, place, and time.  Skin: Skin is warm and dry.  Psychiatric: She has a normal mood and affect. Her behavior is normal. Judgment normal.    Urgent Care Course     Procedures (including critical care time)  Labs Review Labs Reviewed  POCT URINALYSIS DIP (DEVICE) - Abnormal; Notable for the following:       Result Value   Hgb urine dipstick TRACE (*)    Leukocytes, UA MODERATE (*)    All other components within normal limits  HSV CULTURE AND TYPING  CERVICOVAGINAL ANCILLARY ONLY    Imaging Review No results found.    MDM   1. Acute cystitis with hematuria   2.  Genital sore    Discussed lab results with patient, urine dipstick positive for UTI with hematuria. Start Bactrim DS BID x 3 days. Cytology and HSV testing obtained today. Patient will be contacted with any positive results. Given history and exam, will treat empirically for HSV. Start Acyclovir as directed. Patient to follow up with GYN as scheduled for further evaluation.    Ok Edwards, PA-C 03/08/17 2218

## 2017-03-09 LAB — CERVICOVAGINAL ANCILLARY ONLY
Bacterial vaginitis: NEGATIVE
Candida vaginitis: POSITIVE — AB
Chlamydia: NEGATIVE
NEISSERIA GONORRHEA: NEGATIVE
TRICH (WINDOWPATH): NEGATIVE

## 2017-03-11 LAB — HSV CULTURE AND TYPING

## 2017-03-12 ENCOUNTER — Other Ambulatory Visit: Payer: Self-pay | Admitting: Obstetrics and Gynecology

## 2017-04-16 ENCOUNTER — Ambulatory Visit (HOSPITAL_BASED_OUTPATIENT_CLINIC_OR_DEPARTMENT_OTHER): Payer: Commercial Managed Care - PPO | Admitting: Hematology

## 2017-04-16 ENCOUNTER — Encounter: Payer: Self-pay | Admitting: Hematology

## 2017-04-16 ENCOUNTER — Other Ambulatory Visit (HOSPITAL_BASED_OUTPATIENT_CLINIC_OR_DEPARTMENT_OTHER): Payer: Commercial Managed Care - PPO

## 2017-04-16 ENCOUNTER — Telehealth: Payer: Self-pay | Admitting: Hematology

## 2017-04-16 VITALS — BP 135/95 | HR 83 | Temp 98.4°F | Resp 20 | Ht 69.0 in | Wt 179.5 lb

## 2017-04-16 DIAGNOSIS — F329 Major depressive disorder, single episode, unspecified: Secondary | ICD-10-CM

## 2017-04-16 DIAGNOSIS — C50411 Malignant neoplasm of upper-outer quadrant of right female breast: Secondary | ICD-10-CM | POA: Diagnosis not present

## 2017-04-16 DIAGNOSIS — F32A Depression, unspecified: Secondary | ICD-10-CM

## 2017-04-16 DIAGNOSIS — I10 Essential (primary) hypertension: Secondary | ICD-10-CM

## 2017-04-16 DIAGNOSIS — Z170 Estrogen receptor positive status [ER+]: Secondary | ICD-10-CM

## 2017-04-16 DIAGNOSIS — Z79810 Long term (current) use of selective estrogen receptor modulators (SERMs): Secondary | ICD-10-CM

## 2017-04-16 LAB — CBC WITH DIFFERENTIAL/PLATELET
BASO%: 0.4 % (ref 0.0–2.0)
Basophils Absolute: 0 10*3/uL (ref 0.0–0.1)
EOS ABS: 0.1 10*3/uL (ref 0.0–0.5)
EOS%: 1.1 % (ref 0.0–7.0)
HCT: 38.3 % (ref 34.8–46.6)
HGB: 13.1 g/dL (ref 11.6–15.9)
LYMPH%: 24.2 % (ref 14.0–49.7)
MCH: 33.1 pg (ref 25.1–34.0)
MCHC: 34.3 g/dL (ref 31.5–36.0)
MCV: 96.6 fL (ref 79.5–101.0)
MONO#: 0.3 10*3/uL (ref 0.1–0.9)
MONO%: 4.2 % (ref 0.0–14.0)
NEUT%: 70.1 % (ref 38.4–76.8)
NEUTROS ABS: 4.9 10*3/uL (ref 1.5–6.5)
PLATELETS: 297 10*3/uL (ref 145–400)
RBC: 3.96 10*6/uL (ref 3.70–5.45)
RDW: 13.7 % (ref 11.2–14.5)
WBC: 7 10*3/uL (ref 3.9–10.3)
lymph#: 1.7 10*3/uL (ref 0.9–3.3)

## 2017-04-16 LAB — COMPREHENSIVE METABOLIC PANEL
ALT: 14 U/L (ref 0–55)
AST: 17 U/L (ref 5–34)
Albumin: 3.9 g/dL (ref 3.5–5.0)
Alkaline Phosphatase: 38 U/L — ABNORMAL LOW (ref 40–150)
Anion Gap: 7 mEq/L (ref 3–11)
BILIRUBIN TOTAL: 0.6 mg/dL (ref 0.20–1.20)
BUN: 15.9 mg/dL (ref 7.0–26.0)
CO2: 24 meq/L (ref 22–29)
CREATININE: 0.8 mg/dL (ref 0.6–1.1)
Calcium: 8.9 mg/dL (ref 8.4–10.4)
Chloride: 106 mEq/L (ref 98–109)
EGFR: 80 mL/min/{1.73_m2} — AB (ref >90)
GLUCOSE: 95 mg/dL (ref 70–140)
Potassium: 4.5 mEq/L (ref 3.5–5.1)
SODIUM: 138 meq/L (ref 136–145)
TOTAL PROTEIN: 7.1 g/dL (ref 6.4–8.3)

## 2017-04-16 NOTE — Progress Notes (Signed)
[place]  [phone] [phone]  Clinic Follow Up Note   Patient Care Team: [name], MD as PCP - General (Family Medicine) Brien Few, MD as Consulting Physician (Obstetrics and Gynecology) Artelia Laroche, CNM as Midwife (Obstetrics and Gynecology) Excell Seltzer, MD as Consulting Physician (General Surgery) [name], MD as Consulting Physician (Hematology) Gery Pray, MD as Consulting Physician (Radiation Oncology) 04/16/2017  CHIEF COMPLAINT:  Follow up right breast cancer  Oncology History   Breast cancer of upper-outer quadrant of right female breast [facility])   Staging form: Breast, AJCC 7th Edition   - Clinical stage from 04/23/2016: Stage IA (T1c(m), N0, M0) - Signed by [name], MD on 04/30/2016   - Pathologic stage from 05/27/2016: Stage IIA (T2(m), N0, cM0) - Unsigned      Breast cancer of upper-outer quadrant of right female breast ([place])   04/17/2016 Mammogram    Mammogram and US showed a 2.2cm cyst at 12:00, a 1.6cm lobulated mass at 9:00 and a 72m mass at 10:00, axilla was negative       04/23/2016 Initial Diagnosis    Breast cancer of upper-outer quadrant of right female breast ([place]      04/23/2016 Initial Biopsy    Core needle biopsy of right breast masses at 10:00 and 9:30 positions both showed invasive and insitu mammary carcinoma       04/23/2016 Receptors her2    ER 90%+, PR 80-90%+, HER2-, KI67 10-15%      05/12/2016 Procedure    Genetic testing: Negative. Genes analyzed: ATM, BAP1, BARD1, BRCA1, BRCA2, BRIP1, CDH1, CDK4, CDKN2A, CHEK2, EPCAM, FANCC, MITF, MLH1, MSH2, MSH6, NBN, PALB2, PMS2, PTEN, RAD51C, RAD51D, TP53, and XRCC2      05/27/2016 Surgery    Right breast mastectomy and sentinel lymph node biopsy ([place])      05/27/2016 Pathology Results    Right breast mastectomy showed multifocal invasive and in situ lobular carcinoma, 3.2, 0.9 and 0.9 cm, G1 margins were negative, 3 sentinel  lymph nodes were negative.       05/27/2016 Oncotype testing    RS 18, which predicts 10 year distant recurrence risk of 12% with tamoxifen (intermediate risk)       11/23/2016 -  Anti-estrogen oral therapy    Tamoxifen 20 mg daily, she started with 174mdaily for the first month         HISTORY OF PRESENTING ILLNESS (04/30/2016):  AbMax Fickle89.o. female is here because of her recently diagnosed right breast cancer. She is accompanied by her friend and sister to our multidisciplinary breast clinic today.   She felt a breast lump about a few weeks ago. She denies any breast pain, skin change or nipple discharge. She had normal screening mammogram in April 2017. She was seen by her primary care physician for the breast mass, and underwent diagnostic mammogram and ultrasound on 04/17/2016, which showed a 2.2 cm cyst at 12:00 position, a 1.6 cm lobulated mass at 9:00 and a 6 mm mass at 10:00 position, ultrasound of the axilla was negative. She underwent ultrasound guided core needle biopsy of both right breast mass, which all showed invasive lobular carcinoma, ER/PR strongly positive, HER-2 negative.  She feels very well overall, denies any pain or other symptoms. She is physically active, has been quite anxious since her cancer diagnosis.  GYN HISTORY  Menarchal: 12 LMP: [age]hysrectomy)  Contraceptive: 10, stopped in 30's  HRT: none  G1P1: one daughter, 1013o  CURRENT THERAPY: adjuvant Tamoxifen 64m dialy, she started on 11/23/2016 and had 150mdaily for the first month   INTERIM HISTORY:  AbKrishaunaeturns for follow-up. She has been on tamoxifen 2062maily since May 2018 and tolerates well overall. She presented to the ED on 03/08/2017 with complaints of UTI symptoms. She is waiting for her BAR results. She is taking Tamoxifen, 20 mg. She says she has gained some weight, and experiences tolerable hot flashes. Her last MRI was 04/30/2016. [facility] may have to go there to get MRI done to avoid pay.    MEDICAL HISTORY:  Past Medical History:  Diagnosis Date  . Anxiety   . Breast [place] of [place] breast [place]  . Depression   . Family history of breast cancer   . Family history of prostate cancer   . Hypertension    "because of stress" (05/27/2016)  . PONV (postoperative nausea and vomiting)   . Seasonal allergies     SURGICAL HISTORY: Past Surgical History:  Procedure Laterality Date  . ABDOMINAL HYSTERECTOMY  Aug 2011   [employer] TAH for fibroids  . BREAST BIOPSY  03/2016  . FOOT NEUROMA SURGERY Right 2000?  . MMarland KitchenSTECTOMY COMPLETE / SIMPLE W/ SENTINEL NODE BIOPSY Right 05/27/2016  . MASTECTOMY W/ SENTINEL NODE BIOPSY Right 05/27/2016   Procedure: RIGHT TOTAL MASTECTOMY WITH AXILLARY SENTINEL LYMPH NODE BIOPSY;  Surgeon: BenExcell SeltzerD;  [facility];  Laterality: Right;  . TONSILLECTOMY    . WISDOM TOOTH EXTRACTION  ~ 1985    SOCIAL HISTORY: Social History   Social History  . Marital status: Legally Separated    Spouse name: N/A  . Number of children: N/A  . Years of education: N/A   Occupational History  . Law Student    Social History Main Topics  . Smoking status: Former Smoker    Packs/day: 0.50    Years: 15.00    Types: Cigarettes    Quit date: 06/23/1995  . Smokeless tobacco: Never Used  . Alcohol use No     Comment: 05/27/2016 "nothing since 1996"  . Drug use: No     Comment: 05/27/2016 "nothing since 1996"  . Sexual activity: Yes   Other Topics Concern  . Not on file   Social History Narrative   Married. Education: [employer]ercise: Gym 2 times a week fit walking.                         FAMILY HISTORY: Family History  Problem Relation Age of Onset  . Hypertension Mother   . Depression Mother   . Hyperlipidemia Mother   . Heart disease Mother   . Prostate cancer Father 69 36 Mental illness Sister   .  Hypertension Sister   . Prostate cancer Brother        dx in his late 40s71s Mental illness Maternal Grandmother   . Breast cancer Maternal Grandmother        dx in her 60s79s Lung cancer Maternal Grandmother        smoker since age 29;81x in her 70s38s Cancer Maternal Grandfather   . Heart disease Maternal Grandfather   . Mental illness Maternal Grandfather   . Lung cancer Paternal Grandmother        dx in her 70s36sife long smoker  . Prostate cancer  Paternal Grandfather   . Allergies Daughter   . Anxiety disorder Daughter   . Melanoma Brother 15  . Bladder Cancer Sister 44    ALLERGIES:  has No Known Allergies.  MEDICATIONS:  Current Outpatient Prescriptions  Medication Sig Dispense Refill  . cholecalciferol (VITAMIN D) 1000 UNITS tablet Take 6,000 Units by mouth daily.     . COD LIVER OIL PO Take 1 capsule by mouth daily.    Marland Kitchen ibuprofen (ADVIL,MOTRIN) 200 MG tablet Take 200 mg by mouth every 6 (six) hours as needed for mild pain.    . Lactobacillus Rhamnosus, GG, (CULTURELLE PO) Take by mouth.    . loratadine (CLARITIN) 10 MG tablet Take 10 mg by mouth daily.    . magnesium 30 MG tablet Take 500 mg by mouth daily.     . Multiple Vitamins-Minerals (MULTIVITAMIN WITH MINERALS) tablet Take 1 tablet by mouth daily.    . Omega-3 Fatty Acids (FISH OIL PO) Take 1 capsule by mouth daily.     . tamoxifen (NOLVADEX) 20 MG tablet Take 1 tablet (20 mg total) by mouth daily. 30 tablet 2  . valACYclovir (VALTREX) 1000 MG tablet Take 500 mg by mouth as needed.    . venlafaxine (EFFEXOR) 75 MG tablet Take 75 mg by mouth daily.    . Phenazopyridine HCl (AZO TABS PO) Take by mouth as needed.      No current facility-administered medications for this visit.     REVIEW OF SYSTEMS:   Constitutional: Denies fevers, chills or abnormal night sweats (+)tolerable hot flashes (+)weight gain Eyes: Denies blurriness of vision, double vision or watery eyes Ears, nose, mouth, throat, and face: Denies  mucositis or sore throat Respiratory: Denies cough, dyspnea or wheezes Cardiovascular: Denies palpitation, chest discomfort or lower extremity swelling Gastrointestinal:  Denies nausea, heartburn or change in bowel habits Skin: Denies abnormal skin rashes  Lymphatics: Denies new lymphadenopathy or easy bruising Neurological:Denies numbness, tingling or new weaknesses Behavioral/Psych: Mood is stable, no new changes  All other systems were reviewed with the patient and are negative.  PHYSICAL EXAMINATION:  ECOG PERFORMANCE STATUS: 0 - Asymptomatic  Vitals:   04/16/17 0915  BP: (!) 135/95  Pulse: 83  Resp: 20  Temp: 98.4 F (36.9 C)  SpO2: 100%   Filed Weights   04/16/17 0915  Weight: 179 lb 8 oz (81.4 kg)    GENERAL:alert, no distress and comfortable SKIN: skin color, texture, turgor are normal, no rashes or significant lesions EYES: normal, conjunctiva are pink and non-injected, sclera clear OROPHARYNX:no exudate, no erythema and lips, buccal mucosa, and tongue normal  NECK: supple, thyroid normal size, non-tender, without nodularity LYMPH:  no palpable lymphadenopathy in the cervical, axillary or inguinal LUNGS: clear to auscultation and percussion with normal breathing effort HEART: regular rate & rhythm and no murmurs and no lower extremity edema ABDOMEN:abdomen soft, non-tender and normal bowel sounds Musculoskeletal:no cyanosis of digits and no clubbing  PSYCH: alert & oriented x 3 with fluent speech NEURO: no focal motor/sensory deficits Breasts: Right breast is surgically absent, the incision is well healed, no palpable abnormalities, discharge or skin discoloration.  Palpation of the left beast and axilla revealed no obvious mass that I could appreciate.   LABORATORY DATA:  I have reviewed the data as listed CBC Latest Ref Rng & Units 04/16/2017 12/12/2016 09/19/2016  WBC 3.9 - 10.3 10e3/uL 7.0 6.2 7.8  Hemoglobin 11.6 - 15.9 g/dL 13.1 13.9 15.4  Hematocrit 34.8 -  46.6 % 38.3 40.3  45.3  Platelets 145 - 400 10e3/uL 297 356 340   CMP Latest Ref Rng & Units 04/16/2017 12/12/2016 09/19/2016  Glucose 70 - 140 mg/dl 95 94 95  BUN 7.0 - 26.0 mg/dL 15.9 14.0 13.8  Creatinine 0.6 - 1.1 mg/dL 0.8 0.8 1.0  Sodium 136 - 145 mEq/L 138 140 140  Potassium 3.5 - 5.1 mEq/L 4.5 4.1 4.4  Chloride 101 - 111 mmol/L - - -  CO2 22 - 29 mEq/L 24 24 21(L)  Calcium 8.4 - 10.4 mg/dL 8.9 9.1 10.1  Total Protein 6.4 - 8.3 g/dL 7.1 7.1 8.0  Total Bilirubin 0.20 - 1.20 mg/dL 0.60 0.65 0.72  Alkaline Phos 40 - 150 U/L 38(L) 46 49  AST 5 - 34 U/L _0 ALT 0 - 55 U/L _1 PATHOLOGY REPORT  Diagnosis 04/23/2016 1. Breast, right, needle core biopsy, 10 o'clock - INVASIVE AND IN SITU MAMMARY CARCINOMA. 2. Breast, right, needle core biopsy, 9:30 o'clock - INVASIVE AND IN SITU MAMMARY CARCINOMA.  Microscopic Comment 1. , 2. E-Cadherin and breast prognostic profile will be performed.  1. Results: IMMUNOHISTOCHEMICAL AND MORPHOMETRIC ANALYSIS PERFORMED MANUALLY Estrogen Receptor: 90%, POSITIVE, STRONG STAINING INTENSITY Progesterone Receptor: 80%, POSITIVE, STRONG STAINING INTENSITY Proliferation Marker Ki67: 10%  Results: HER2 - NEGATIVE RATIO OF HER2/CEP17 SIGNALS 1.60 AVERAGE HER2 COPY NUMBER PER CELL 2.00  2. PROGNOSTIC INDICATORS Results: IMMUNOHISTOCHEMICAL AND MORPHOMETRIC ANALYSIS PERFORMED MANUALLY Estrogen Receptor: 90%, POSITIVE, STRONG STAINING INTENSITY Progesterone Receptor: 90%, POSITIVE, STRONG STAINING INTENSITY Proliferation Marker Ki67: 15%  Results: HER2 - NEGATIVE RATIO OF HER2/CEP17 SIGNALS 1.36 AVERAGE HER2 COPY NUMBER PER CELL 1.70  Diagnosis 05/27/2016 1. Breast, simple mastectomy, Right - MULTIFOCAL INVASIVE AND IN SITU LOBULAR CARCINOMA, 3.2, 0.9, AND 0.9 CM. - MULTIFOCAL LOBULAR CARCINOMA IN SITU. - MARGINS NOT INVOLVED. - FIBROCYSTIC CHANGES WITH PSEUDOANGIOMATOUS STROMAL HYPERPLASIA. - PREVIOUS BIOPSY SITES. 2. Lymph node,  sentinel, biopsy, Right axillary #1 - ONE BENIGN LYMPH NODE (0/1). 3. Lymph node, sentinel, biopsy, Right axillary #2 - ONE BENIGN LYMPH NODE (0/1). 4. Lymph node, sentinel, biopsy, Right axillary #3 - ONE BENIGN LYMPH NODE (0/1). Microscopic Comment 1. BREAST, INVASIVE TUMOR, WITH LYMPH NODES PRESENT Specimen, including laterality and lymph node sampling (sentinel, non-sentinel): Right breast and three sentinel lymph nodes. Procedure: Mastectomy and sentinel lymph node biopsies. Histologic type: Lobular. Grade: 1 Tubule formation: 3 Nuclear pleomorphism: 1 Mitotic:1 Tumor size (gross measurement): 3.2, 0.9, and 0.9 cm Margins: Free of tumor. Invasive, distance to closest margin: 0.9 cm from anterior margin. In-situ, distance to closest margin: 0.9 cm from anterior margin. If margin positive, focally or broadly: N/A Lymphovascular invasion: Not identified. Ductal carcinoma in situ: No. Grade: N/A 1 of 4 FINAL for Kolden, Winter C ([id number]) Microscopic Comment(continued) Extensive intraductal component: N/A Lobular neoplasia: Yes, lobular carcinoma in situ. Tumor focality: Multifocal. Extent of tumor: Skin: Free of tumor Nipple: Free of tumor. Skeletal muscle: Free of tumor. Lymph nodes: Examined: 3 Sentinel 0 Non-sentinel 3 Total Lymph nodes with metastasis: 0 Isolated tumor cells (< 0.2 mm): 0 Micrometastasis: (> 0.2 mm and < 2.0 mm): 0 Macrometastasis: (> 2.0 mm): 0 Extracapsular extension: N/A Breast prognostic profile: Case [id number]. Estrogen receptor: 90%, positive, strong staining. Progesterone receptor: 90%, positive, strong staining. Her 2 neu: Negative, ratio 1.36 and 1.6. Ki-67: 10% and 15%. Non-neoplastic breast: Fibrocystic changes and pseudoangiomatous stromal hyperplasia. TNM: pT2(multifocal), pN0, pMX Comments: There are three nodules of invasive and in situ lobular carcinoma which are 3.2, 0.9, and  0.9 cm. There are also separate foci of  lobular carcinoma in situ. The margins of the specimen are not involved by invasive or in situ lobular carcinoma. Immunohistochemistry for cytokeratin AE1/AE3 is performed on on all of the sentinel lymph nodes and no positivity is identified. (JDP:gt, 05/29/16)  Oncotype Dx RS 18, which predicts 10 year distant recurrence risk of 12% with tamoxifen  RADIOGRAPHIC STUDIES: I have personally reviewed the radiological images as listed and agreed with the findings in the report. No results found.  ASSESSMENT & PLAN:  51 y.o. Caucasian female, no significant past medical history, presented with a palpable right breast mass  1. Breast cancer of upper outer quadrant of right breast, invasive lobular carcinoma, mpT2N0M0, stage IIA, ER+/PR+/HER2-, G1, RS 18  ---I previously discussed her surgical path result in details. -She had multifocal disease, surgical margins were negative. Nodes were negative. -the Oncotype Dx result was reviewed with her in details. She has intermedia risk based on the recurrence score, which predicts 10 year distant recurrence after 5 years of tamoxifen 12%. The benefit of chemotherapy in the intermedia risk group is small and controversial. Given her relatively low score in the intermedia group, I did not recommend adjuvant chemotherapy. She agrees with the plan -Giving the strong ER and PR expression in her tumor, I recommend adjuvant endocrine therapy -Her [place] and estradiol level showed she is still premenopausal -She has finally started adjuvant tamoxifen, tolerated full dose 37m daily well.  -She is not interested in breast reconstruction surgery at this time. -Continue breast cancer surveillance. She is clinically doing well, exam is unremarkable, lab today CBC and CMP are normal, there is no clinical concern of cancer recurrence. -Continue annual screening mammogram of left breast, she is past due, [facility]-Due to her dense breast tissue  (category D), she may benefit from screen MRI, which she is very interested in. I will schedule her breast MRI in 6 months   2. Bone health -We previously discussed the effect on bone density from tamoxifen or aromatase inhibitor -I encouraged her to take calcium and vitamin D  3. Hypertension, depression  -She has stopped Wellbutrin, and has stopped Zoloft -She has started taking Effexor and is feeling well. -Continue follow-up with her primary care physician    [facility] [place]   Orders Placed This Encounter  Procedures  . MR BREAST BILATERAL W WO CONTRAST    Standing Status:   Future    Standing Expiration Date:   06/16/2018    Order Specific Question:   If indicated for the ordered procedure, I authorize the administration of contrast media per Radiology protocol    Answer:   Yes    Order Specific Question:   What is the patient's sedation requirement?    Answer:   No Sedation    Order Specific Question:   Does the patient have a pacemaker or implanted devices?    Answer:   No    Order Specific Question:   Radiology Contrast Protocol - do NOT remove file path    Answer:   _0 [email or web address]    Order Specific Question:   Reason for Exam additional comments    Answer:   history of right breast cancer    Order Specific Question:   Preferred imaging location?    Answer:   [facility] (table  limit-550lbs)  . MM DIAG BREAST TOMO UNI LEFT    Standing Status:   Future    Standing Expiration Date:   04/16/2018    Order Specific Question:   Reason for Exam (SYMPTOM  OR DIAGNOSIS REQUIRED)    Answer:   history of breast cancer    Order Specific Question:   Is the patient pregnant?    Answer:   No    Order Specific Question:   Preferred imaging location?    Answer:   [facility]    All questions were answered. The patient knows to call the clinic with any  problems, questions or concerns. I spent 25 minutes counseling the patient face to face. The total time spent in the appointment was 30 minutes and more than 50% was on counseling.  This document serves as a record of services personally performed by [name], MD. It was created on her behalf by Brandt Loosen, a trained medical scribe. The creation of this record is based on the scribe's personal observations and the provider's statements to them. This document has been checked and approved by the attending provider.   [name], MD 04/16/2017

## 2017-04-16 NOTE — Telephone Encounter (Signed)
Spoke with patient. Did not have any check out orders to set up further appts with her.

## 2017-04-17 ENCOUNTER — Telehealth: Payer: Self-pay | Admitting: Hematology

## 2017-04-17 NOTE — Telephone Encounter (Signed)
Spoke with [place] at [place] and set up her mammogram.   [facility] and they needed and mammography record.   Called patient and left a voicemail for the regarding GI's contact info and her f/u info for Feb.

## 2017-04-20 ENCOUNTER — Ambulatory Visit: Payer: Commercial Managed Care - PPO | Admitting: Hematology

## 2017-04-20 ENCOUNTER — Other Ambulatory Visit: Payer: Commercial Managed Care - PPO

## 2017-04-28 ENCOUNTER — Encounter: Payer: Self-pay | Admitting: Hematology

## 2017-06-24 ENCOUNTER — Other Ambulatory Visit: Payer: Self-pay | Admitting: Hematology

## 2017-06-24 DIAGNOSIS — Z170 Estrogen receptor positive status [ER+]: Secondary | ICD-10-CM

## 2017-06-24 DIAGNOSIS — C50411 Malignant neoplasm of upper-outer quadrant of right female breast: Secondary | ICD-10-CM

## 2017-06-29 ENCOUNTER — Other Ambulatory Visit: Payer: Self-pay | Admitting: *Deleted

## 2017-06-29 DIAGNOSIS — Z170 Estrogen receptor positive status [ER+]: Secondary | ICD-10-CM

## 2017-06-29 DIAGNOSIS — C50411 Malignant neoplasm of upper-outer quadrant of right female breast: Secondary | ICD-10-CM

## 2017-06-29 MED ORDER — TAMOXIFEN CITRATE 20 MG PO TABS: 20.0000 mg | ORAL_TABLET | Freq: Every day | ORAL | 4 refills | Status: DC

## 2017-06-30 ENCOUNTER — Ambulatory Visit (INDEPENDENT_AMBULATORY_CARE_PROVIDER_SITE_OTHER): Payer: Commercial Managed Care - PPO | Admitting: Family Medicine

## 2017-06-30 ENCOUNTER — Encounter: Payer: Self-pay | Admitting: Family Medicine

## 2017-06-30 VITALS — BP 148/90 | HR 96 | Temp 98.3°F | Resp 16 | Ht 69.0 in | Wt 184.0 lb

## 2017-06-30 DIAGNOSIS — I10 Essential (primary) hypertension: Secondary | ICD-10-CM | POA: Diagnosis not present

## 2017-06-30 DIAGNOSIS — Z23 Encounter for immunization: Secondary | ICD-10-CM | POA: Diagnosis not present

## 2017-06-30 MED ORDER — AMLODIPINE BESYLATE 2.5 MG PO TABS: 2.5000 mg | ORAL_TABLET | Freq: Every day | ORAL | 1 refills | Status: DC

## 2017-06-30 NOTE — Patient Instructions (Addendum)
Start amlodipine once per day. Keep a record of your blood pressures outside of the office and bring them to the next office visit. Recheck in next 2-3 months, but send me an update in next 3-4 weeks by Mychart. Let me know if there are any questions in the meantime.     Hypertension Hypertension, commonly called high blood pressure, is when the force of blood pumping through the arteries is too strong. The arteries are the blood vessels that carry blood from the heart throughout the body. Hypertension forces the heart to work harder to pump blood and may cause arteries to become narrow or stiff. Having untreated or uncontrolled hypertension can cause heart attacks, strokes, kidney disease, and other problems. A blood pressure reading consists of a higher number over a lower number. Ideally, your blood pressure should be below 120/80. The first ("top") number is called the systolic pressure. It is a measure of the pressure in your arteries as your heart beats. The second ("bottom") number is called the diastolic pressure. It is a measure of the pressure in your arteries as the heart relaxes. What are the causes? The cause of this condition is not known. What increases the risk? Some risk factors for high blood pressure are under your control. Others are not. Factors you can change  Smoking.  Having type 2 diabetes mellitus, high cholesterol, or both.  Not getting enough exercise or physical activity.  Being overweight.  Having too much fat, sugar, calories, or salt (sodium) in your diet.  Drinking too much alcohol. Factors that are difficult or impossible to change  Having chronic kidney disease.  Having a family history of high blood pressure.  Age. Risk increases with age.  Race. You may be at higher risk if you are African-American.  Gender. Men are at higher risk than women before age 58. After age 19, women are at higher risk than men.  Having obstructive sleep  apnea.  Stress. What are the signs or symptoms? Extremely high blood pressure (hypertensive crisis) may cause:  Headache.  Anxiety.  Shortness of breath.  Nosebleed.  Nausea and vomiting.  Severe chest pain.  Jerky movements you cannot control (seizures).  How is this diagnosed? This condition is diagnosed by measuring your blood pressure while you are seated, with your arm resting on a surface. The cuff of the blood pressure monitor will be placed directly against the skin of your upper arm at the level of your heart. It should be measured at least twice using the same arm. Certain conditions can cause a difference in blood pressure between your right and left arms. Certain factors can cause blood pressure readings to be lower or higher than normal (elevated) for a short period of time:  When your blood pressure is higher when you are in a health care provider's office than when you are at home, this is called white coat hypertension. Most people with this condition do not need medicines.  When your blood pressure is higher at home than when you are in a health care provider's office, this is called masked hypertension. Most people with this condition may need medicines to control blood pressure.  If you have a high blood pressure reading during one visit or you have normal blood pressure with other risk factors:  You may be asked to return on a different day to have your blood pressure checked again.  You may be asked to monitor your blood pressure at home for 1 week or  longer.  If you are diagnosed with hypertension, you may have other blood or imaging tests to help your health care provider understand your overall risk for other conditions. How is this treated? This condition is treated by making healthy lifestyle changes, such as eating healthy foods, exercising more, and reducing your alcohol intake. Your health care provider may prescribe medicine if lifestyle changes are  not enough to get your blood pressure under control, and if:  Your systolic blood pressure is above 130.  Your diastolic blood pressure is above 80.  Your personal target blood pressure may vary depending on your medical conditions, your age, and other factors. Follow these instructions at home: Eating and drinking  Eat a diet that is high in fiber and potassium, and low in sodium, added sugar, and fat. An example eating plan is called the DASH (Dietary Approaches to Stop Hypertension) diet. To eat this way: ? Eat plenty of fresh fruits and vegetables. Try to fill half of your plate at each meal with fruits and vegetables. ? Eat whole grains, such as whole wheat pasta, brown rice, or whole grain bread. Fill about one quarter of your plate with whole grains. ? Eat or drink low-fat dairy products, such as skim milk or low-fat yogurt. ? Avoid fatty cuts of meat, processed or cured meats, and poultry with skin. Fill about one quarter of your plate with lean proteins, such as fish, chicken without skin, beans, eggs, and tofu. ? Avoid premade and processed foods. These tend to be higher in sodium, added sugar, and fat.  Reduce your daily sodium intake. Most people with hypertension should eat less than 1,500 mg of sodium a day.  Limit alcohol intake to no more than 1 drink a day for nonpregnant women and 2 drinks a day for men. One drink equals 12 oz of beer, 5 oz of wine, or 1 oz of hard liquor. Lifestyle  Work with your health care provider to maintain a healthy body weight or to lose weight. Ask what an ideal weight is for you.  Get at least 30 minutes of exercise that causes your heart to beat faster (aerobic exercise) most days of the week. Activities may include walking, swimming, or biking.  Include exercise to strengthen your muscles (resistance exercise), such as pilates or lifting weights, as part of your weekly exercise routine. Try to do these types of exercises for 30 minutes at  least 3 days a week.  Do not use any products that contain nicotine or tobacco, such as cigarettes and e-cigarettes. If you need help quitting, ask your health care provider.  Monitor your blood pressure at home as told by your health care provider.  Keep all follow-up visits as told by your health care provider. This is important. Medicines  Take over-the-counter and prescription medicines only as told by your health care provider. Follow directions carefully. Blood pressure medicines must be taken as prescribed.  Do not skip doses of blood pressure medicine. Doing this puts you at risk for problems and can make the medicine less effective.  Ask your health care provider about side effects or reactions to medicines that you should watch for. Contact a health care provider if:  You think you are having a reaction to a medicine you are taking.  You have headaches that keep coming back (recurring).  You feel dizzy.  You have swelling in your ankles.  You have trouble with your vision. Get help right away if:  You develop a  severe headache or confusion.  You have unusual weakness or numbness.  You feel faint.  You have severe pain in your chest or abdomen.  You vomit repeatedly.  You have trouble breathing. Summary  Hypertension is when the force of blood pumping through your arteries is too strong. If this condition is not controlled, it may put you at risk for serious complications.  Your personal target blood pressure may vary depending on your medical conditions, your age, and other factors. For most people, a normal blood pressure is less than 120/80.  Hypertension is treated with lifestyle changes, medicines, or a combination of both. Lifestyle changes include weight loss, eating a healthy, low-sodium diet, exercising more, and limiting alcohol. This information is not intended to replace advice given to you by your health care provider. Make sure you discuss any  questions you have with your health care provider. Document Released: 08/11/2005 Document Revised: 07/09/2016 Document Reviewed: 07/09/2016 Elsevier Interactive Patient Education  2018 [employer].     IF you received an x-ray today, you will receive an invoice from [facility]. Please contact [facility] at [phone] with questions or concerns regarding your invoice.   IF you received labwork today, you will receive an invoice from [place]. Please contact [facility] at [phone] with questions or concerns regarding your invoice.   Our billing staff will not be able to assist you with questions regarding bills from these companies.  You will be contacted with the lab results as soon as they are available. The fastest way to get your results is to activate your My Chart account. Instructions are located on the last page of this paperwork. If you have not heard from [place] regarding the results in 2 weeks, please contact this office.

## 2017-06-30 NOTE — Progress Notes (Signed)
Subjective:    Patient ID: Caitlin Monroe, female    DOB: March 27, 1966, 51 y.o.   MRN: [id number]  HPI  Caitlin Monroe is a 51 y.o. female Presents today for: Chief Complaint  Patient presents with  . Hypertension    patient states that she has noticed elevated readings for the past few months, would like to be checked   History of breast cancer, depression with anxiety, and hypertension in the past. I last saw her November 2017. She had been experiencing palpitations with blood pressures 270 systolic, diastolic 35K in September 2017, start on metoprolol 25 mg twice a day.  When seen in November 2017, she had stopped metoprolol, home blood pressure readings remaining below 140/90 and palpitations had subsided. Decided to remain off medications with home monitoring and RTC precautions.  Home BP's had been controlled - 110/75, 118/80 in January and March. Started Tamoxifen in April. Blood pressure started to increase - noticed reading of 135/95 in August, 149/104 at urologist in October. Home readings 145/103, then 137/93 after attempts at relaxation. No symptoms with the elevated blood pressure.   Exercising, yoga, running few times per week, no tobacco, no alcohol.    Lab Results  Component Value Date   CREATININE 0.8 04/16/2017     Patient Active Problem List   Diagnosis Date Noted  . Breast cancer, right ([place]) 05/27/2016  . Genetic testing 05/13/2016  . Family history of breast cancer   . Family history of prostate cancer   . Breast cancer of upper-outer quadrant of right female breast ([place]) 04/25/2016  . Depression 06/27/2015  . Seasonal affective disorder ([place]) 06/27/2015  . Allergic rhinitis 04/05/2013  . Depression with anxiety 04/05/2013   Past Medical History:  Diagnosis Date  . Anxiety   . Breast cancer of upper-outer quadrant of right female breast ([place]) 04/25/2016  . Depression   . Family history of breast cancer   . Family history of prostate cancer   .  Hypertension    "because of stress" (05/27/2016)  . PONV (postoperative nausea and vomiting)   . Seasonal allergies    Past Surgical History:  Procedure Laterality Date  . ABDOMINAL HYSTERECTOMY  Aug 2011   Da Vinci TAH for fibroids  . BREAST BIOPSY  03/2016  . FOOT NEUROMA SURGERY Right 2000?  Marland Kitchen MASTECTOMY COMPLETE / SIMPLE W/ SENTINEL NODE BIOPSY Right 05/27/2016  . TONSILLECTOMY    . WISDOM TOOTH EXTRACTION  ~ 1985   No Known Allergies Prior to Admission medications   Medication Sig Start Date End Date Taking? Authorizing Provider  amphetamine-dextroamphetamine (ADDERALL) 10 MG tablet TK 1 T PO  QAM 06/25/17  Yes [provider]  cholecalciferol (VITAMIN D) 1000 UNITS tablet Take 6,000 Units by mouth daily.    Yes [provider]  COD LIVER OIL PO Take 1 capsule by mouth daily.   Yes [provider]  ibuprofen (ADVIL,MOTRIN) 200 MG tablet Take 200 mg by mouth every 6 (six) hours as needed for mild pain.   Yes [provider]  Lactobacillus Rhamnosus, GG, (CULTURELLE PO) Take by mouth.   Yes [provider]  loratadine (CLARITIN) 10 MG tablet Take 10 mg by mouth daily.   Yes [provider]  magnesium 30 MG tablet Take 500 mg by mouth daily.    Yes [provider]  Multiple Vitamins-Minerals (MULTIVITAMIN WITH MINERALS) tablet Take 1 tablet by mouth daily.   Yes [provider]  Omega-3 Fatty Acids ([place]  OIL PO) Take 1 capsule by mouth daily.    Yes [provider]  Phenazopyridine HCl (AZO TABS PO) Take by mouth as needed.    Yes [provider]  tamoxifen (NOLVADEX) 20 MG tablet Take 1 tablet (20 mg total) daily by mouth. 06/29/17  Yes [name], MD  valACYclovir (VALTREX) 1000 MG tablet Take 500 mg by mouth as needed. 04/03/17  Yes [provider]  venlafaxine (EFFEXOR) 75 MG tablet Take 75 mg by mouth daily.   Yes [provider]   Social History   Socioeconomic History  .  Marital status: Legally Separated    Spouse name: Not on file  . Number of children: Not on file  . Years of education: Not on file  . Highest education level: Not on file  Social Needs  . Financial resource strain: Not on file  . Food insecurity - worry: Not on file  . Food insecurity - inability: Not on file  . Transportation needs - medical: Not on file  . Transportation needs - non-medical: Not on file  Occupational History  . Occupation: Education administrator  Tobacco Use  . Smoking status: Former Smoker    Packs/day: 0.50    Years: 15.00    Pack years: 7.50    Types: Cigarettes    Last attempt to quit: 06/23/1995    Years since quitting: 22.0  . Smokeless tobacco: Never Used  Substance and Sexual Activity  . Alcohol use: No    Alcohol/week: 0.0 oz    Comment: 05/27/2016 "nothing since 1996"  . Drug use: No    Comment: 05/27/2016 "nothing since 1996"  . Sexual activity: Yes  Other Topics Concern  . Not on file  Social History Narrative   Married. Education: [employer]. Exercise: Gym 2 times a week fit walking.                      Review of Systems  Constitutional: Negative for fatigue. Unexpected weight change: min weight gain.   Respiratory: Negative for chest tightness and shortness of breath.   Cardiovascular: Negative for chest pain, palpitations and leg swelling.  Gastrointestinal: Positive for diarrhea. Negative for abdominal pain and blood in stool.  Neurological: Negative for dizziness, syncope, light-headedness and headaches.       Objective:   Physical Exam Vitals:   06/30/17 1617 06/30/17 1719  BP: (!) 150/92 (!) 148/90  Pulse: 96   Resp: 16   Temp: 98.3 F (36.8 C)   TempSrc: Oral   SpO2: 98%   Weight: 184 lb (83.5 kg)   Height: 5\' 9"  (1.753 m)    Wt Readings from Last 3 Encounters:  06/30/17 184 lb (83.5 kg)  04/16/17 179 lb 8 oz (81.4 kg)  12/12/16 173 lb 1.6 oz (78.5 kg)       Assessment & Plan:    Caitlin Monroe is a 51 y.o.  female Need for prophylactic vaccination and inoculation against influenza - Plan: Flu Vaccine QUAD 36+ mos IM  Essential hypertension  - Return of elevated readings. Suspect genetic component of hypertension, but does attribute some increased readings since starting tamoxifen. May also be related to some weight changes, but is exercising currently and watching her diet.  -with absence of palpitations, will start amlodipine in place of beta blocker, initially low-dose with home monitoring, recheck in the next 3 months. Potential side effects discussed. Advised to update me by mychart in the next few weeks.  Meds ordered  this encounter  Medications  . amphetamine-dextroamphetamine (ADDERALL) 10 MG tablet    Sig: TK 1 T PO  QAM    Refill:  0  . amLODipine (NORVASC) 2.5 MG tablet    Sig: Take 1 tablet (2.5 mg total) daily by mouth.    Dispense:  90 tablet    Refill:  1   Patient Instructions   Start amlodipine once per day. Keep a record of your blood pressures outside of the office and bring them to the next office visit. Recheck in next 2-3 months, but send me an update in next 3-4 weeks by Mychart. Let me know if there are any questions in the meantime.     Hypertension Hypertension, commonly called high blood pressure, is when the force of blood pumping through the arteries is too strong. The arteries are the blood vessels that carry blood from the heart throughout the body. Hypertension forces the heart to work harder to pump blood and may cause arteries to become narrow or stiff. Having untreated or uncontrolled hypertension can cause heart attacks, strokes, kidney disease, and other problems. A blood pressure reading consists of a higher number over a lower number. Ideally, your blood pressure should be below 120/80. The first ("top") number is called the systolic pressure. It is a measure of the pressure in your arteries as your heart beats. The second ("bottom") number is called the  diastolic pressure. It is a measure of the pressure in your arteries as the heart relaxes. What are the causes? The cause of this condition is not known. What increases the risk? Some risk factors for high blood pressure are under your control. Others are not. Factors you can change  Smoking.  Having type 2 diabetes mellitus, high cholesterol, or both.  Not getting enough exercise or physical activity.  Being overweight.  Having too much fat, sugar, calories, or salt (sodium) in your diet.  Drinking too much alcohol. Factors that are difficult or impossible to change  Having chronic kidney disease.  Having a family history of high blood pressure.  Age. Risk increases with age.  Race. You may be at higher risk if you are African-American.  Gender. Men are at higher risk than women before age 66. After age 63, women are at higher risk than men.  Having obstructive sleep apnea.  Stress. What are the signs or symptoms? Extremely high blood pressure (hypertensive crisis) may cause:  Headache.  Anxiety.  Shortness of breath.  Nosebleed.  Nausea and vomiting.  Severe chest pain.  Jerky movements you cannot control (seizures).  How is this diagnosed? This condition is diagnosed by measuring your blood pressure while you are seated, with your arm resting on a surface. The cuff of the blood pressure monitor will be placed directly against the skin of your upper arm at the level of your heart. It should be measured at least twice using the same arm. Certain conditions can cause a difference in blood pressure between your right and left arms. Certain factors can cause blood pressure readings to be lower or higher than normal (elevated) for a short period of time:  When your blood pressure is higher when you are in a health care provider's office than when you are at home, this is called white coat hypertension. Most people with this condition do not need medicines.  When  your blood pressure is higher at home than when you are in a health care provider's office, this is called masked hypertension. Most  people with this condition may need medicines to control blood pressure.  If you have a high blood pressure reading during one visit or you have normal blood pressure with other risk factors:  You may be asked to return on a different day to have your blood pressure checked again.  You may be asked to monitor your blood pressure at home for 1 week or longer.  If you are diagnosed with hypertension, you may have other blood or imaging tests to help your health care provider understand your overall risk for other conditions. How is this treated? This condition is treated by making healthy lifestyle changes, such as eating healthy foods, exercising more, and reducing your alcohol intake. Your health care provider may prescribe medicine if lifestyle changes are not enough to get your blood pressure under control, and if:  Your systolic blood pressure is above 130.  Your diastolic blood pressure is above 80.  Your personal target blood pressure may vary depending on your medical conditions, your age, and other factors. Follow these instructions at home: Eating and drinking  Eat a diet that is high in fiber and potassium, and low in sodium, added sugar, and fat. An example eating plan is called the DASH (Dietary Approaches to Stop Hypertension) diet. To eat this way: ? Eat plenty of fresh fruits and vegetables. Try to fill half of your plate at each meal with fruits and vegetables. ? Eat whole grains, such as whole wheat pasta, brown rice, or whole grain bread. Fill about one quarter of your plate with whole grains. ? Eat or drink low-fat dairy products, such as skim milk or low-fat yogurt. ? Avoid fatty cuts of meat, processed or cured meats, and poultry with skin. Fill about one quarter of your plate with lean proteins, such as fish, chicken without skin, beans,  eggs, and tofu. ? Avoid premade and processed foods. These tend to be higher in sodium, added sugar, and fat.  Reduce your daily sodium intake. Most people with hypertension should eat less than 1,500 mg of sodium a day.  Limit alcohol intake to no more than 1 drink a day for nonpregnant women and 2 drinks a day for men. One drink equals 12 oz of beer, 5 oz of wine, or 1 oz of hard liquor. Lifestyle  Work with your health care provider to maintain a healthy body weight or to lose weight. Ask what an ideal weight is for you.  Get at least 30 minutes of exercise that causes your heart to beat faster (aerobic exercise) most days of the week. Activities may include walking, swimming, or biking.  Include exercise to strengthen your muscles (resistance exercise), such as pilates or lifting weights, as part of your weekly exercise routine. Try to do these types of exercises for 30 minutes at least 3 days a week.  Do not use any products that contain nicotine or tobacco, such as cigarettes and e-cigarettes. If you need help quitting, ask your health care provider.  Monitor your blood pressure at home as told by your health care provider.  Keep all follow-up visits as told by your health care provider. This is important. Medicines  Take over-the-counter and prescription medicines only as told by your health care provider. Follow directions carefully. Blood pressure medicines must be taken as prescribed.  Do not skip doses of blood pressure medicine. Doing this puts you at risk for problems and can make the medicine less effective.  Ask your health care provider about side  effects or reactions to medicines that you should watch for. Contact a health care provider if:  You think you are having a reaction to a medicine you are taking.  You have headaches that keep coming back (recurring).  You feel dizzy.  You have swelling in your ankles.  You have trouble with your vision. Get help right  away if:  You develop a severe headache or confusion.  You have unusual weakness or numbness.  You feel faint.  You have severe pain in your chest or abdomen.  You vomit repeatedly.  You have trouble breathing. Summary  Hypertension is when the force of blood pumping through your arteries is too strong. If this condition is not controlled, it may put you at risk for serious complications.  Your personal target blood pressure may vary depending on your medical conditions, your age, and other factors. For most people, a normal blood pressure is less than 120/80.  Hypertension is treated with lifestyle changes, medicines, or a combination of both. Lifestyle changes include weight loss, eating a healthy, low-sodium diet, exercising more, and limiting alcohol. This information is not intended to replace advice given to you by your health care provider. Make sure you discuss any questions you have with your health care provider. Document Released: 08/11/2005 Document Revised: 07/09/2016 Document Reviewed: 07/09/2016 Elsevier Interactive Patient Education  2018 [employer].     IF you received an x-ray today, you will receive an invoice from [facility]. Please contact [facility] at [phone] with questions or concerns regarding your invoice.   IF you received labwork today, you will receive an invoice from [facility]. Please contact [facility] at [phone] with questions or concerns regarding your invoice.   Our billing staff will not be able to assist you with questions regarding bills from these companies.  You will be contacted with the lab results as soon as they are available. The fastest way to get your results is to activate your My Chart account. Instructions are located on the last page of this paperwork. If you have not heard from [place] regarding the results in 2 weeks, please contact this office.      Signed,   [name], MD [facility].  07/01/17 3:31 PM

## 2017-07-29 ENCOUNTER — Ambulatory Visit
Admission: RE | Admit: 2017-07-29 | Discharge: 2017-07-29 | Disposition: A | Discharge status: Home / Self Care | Payer: Commercial Managed Care - PPO | Source: Ambulatory Visit | Attending: Hematology | Admitting: Hematology

## 2017-07-29 DIAGNOSIS — Z170 Estrogen receptor positive status [ER+]: Secondary | ICD-10-CM

## 2017-07-29 DIAGNOSIS — C50411 Malignant neoplasm of upper-outer quadrant of right female breast: Secondary | ICD-10-CM

## 2017-07-29 MED ORDER — GADOBENATE DIMEGLUMINE 529 MG/ML IV SOLN
16.0000 mL | Freq: Once | INTRAVENOUS | Status: AC | PRN
  Administered 2017-07-29: 16 mL via INTRAVENOUS

## 2017-08-05 ENCOUNTER — Telehealth: Payer: Self-pay | Admitting: Emergency Medicine

## 2017-08-05 ENCOUNTER — Telehealth: Payer: Self-pay | Admitting: *Deleted

## 2017-08-05 NOTE — Telephone Encounter (Signed)
Spoke with pt and informed pt re:  Per Dr. Burr Medico, [place] to stop Tamoxifen on 09/19/17 and restart on 10/21/17.  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

## 2017-08-05 NOTE — Telephone Encounter (Signed)
Called patient per Dr.Feng note to inform her of MRI results. No breast malignancy. Pt verbalized understanding of this.   Pt requested for next appt with Dr.Feng on 2/25 be rescheduled to any day after 2/27 d/t pt taking BAR exam.scheduling message sent for reschedule.  Pt also asking for a break from tamoxifen while she studies for her exam. This nurse told her I would discuss with Dr.Feng and return a call to her. Pt verbalized understanding of this.

## 2017-08-05 NOTE — Telephone Encounter (Signed)
-----   Message from [name], MD sent at 08/05/2017  4:53 PM EST ----- Yes, up to a month break is OK. 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 Patients exam is on 10/20/17. She said a 30 day break will be fine or whatever you think is appropriate. Thanks

## 2017-08-11 ENCOUNTER — Telehealth: Payer: Self-pay | Admitting: Hematology

## 2017-08-11 NOTE — Telephone Encounter (Signed)
Left message for patient regarding upcoming March appointments per 12/12 sch message.

## 2017-10-05 ENCOUNTER — Telehealth: Payer: Self-pay | Admitting: *Deleted

## 2017-10-05 ENCOUNTER — Encounter: Payer: Self-pay | Admitting: Sports Medicine

## 2017-10-05 ENCOUNTER — Ambulatory Visit (INDEPENDENT_AMBULATORY_CARE_PROVIDER_SITE_OTHER): Payer: Commercial Managed Care - PPO | Admitting: Sports Medicine

## 2017-10-05 VITALS — BP 140/98 | HR 91 | Resp 16

## 2017-10-05 DIAGNOSIS — M79674 Pain in right toe(s): Secondary | ICD-10-CM | POA: Diagnosis not present

## 2017-10-05 DIAGNOSIS — I7300 Raynaud's syndrome without gangrene: Secondary | ICD-10-CM | POA: Diagnosis not present

## 2017-10-05 DIAGNOSIS — S90424A Blister (nonthermal), right lesser toe(s), initial encounter: Secondary | ICD-10-CM | POA: Diagnosis not present

## 2017-10-05 DIAGNOSIS — L819 Disorder of pigmentation, unspecified: Secondary | ICD-10-CM | POA: Diagnosis not present

## 2017-10-05 NOTE — Progress Notes (Signed)
   Subjective:    Patient ID: Caitlin Monroe, female    DOB: [birth year], 52 y.o.   MRN: [id number]  HPI Chief Complaint  Patient presents with  . Toe Pain    3rd toe right - discoloration of toe at plantar tip x few week, hx of raynauds, [place] and thinks irritated      Review of Systems  Endocrine: Positive for cold intolerance.  Skin: Positive for color change.  All other systems reviewed and are negative.      Objective:   Physical Exam        Assessment & Plan:

## 2017-10-05 NOTE — Progress Notes (Signed)
Subjective: Caitlin Monroe is a 52 y.o. female patient who presents to office for evaluation of right 3rd toe pain. Patient complains of progressive pain especially over the last weeks foot at the 3rd toe after wearing open toed shoes on a cold day. Reports that history of Raynauds, self diagnosed and wanted to have the toe check because of pain that she experienced last week. Reports pain is now better but wanted to have toe checked. Patient denies any other pedal complaints. Denies any other injury/trip/fall/sprain/any causative factors.   Review of Systems  Musculoskeletal:       Right 3rd toe pain  Skin:       Color change Cold intolerance  All other systems reviewed and are negative.   Patient Active Problem List   Diagnosis Date Noted  . Breast cancer, right ([place]) 05/27/2016  . Genetic testing 05/13/2016  . Invasive carcinoma of breast ([place]) 05/06/2016  . Family history of breast cancer   . Family history of prostate cancer   . Breast cancer of upper-outer quadrant of right female breast ([place]) 04/25/2016  . Depression 06/27/2015  . Seasonal affective disorder ([place]) 06/27/2015  . Allergic rhinitis 04/05/2013  . Depression with anxiety 04/05/2013    Current Outpatient Medications on File Prior to Visit  Medication Sig Dispense Refill  . amLODipine (NORVASC) 2.5 MG tablet Take 1 tablet (2.5 mg total) daily by mouth. 90 tablet 1  . amphetamine-dextroamphetamine (ADDERALL) 10 MG tablet TK 1 T PO  QAM  0  . cholecalciferol (VITAMIN D) 1000 UNITS tablet Take 6,000 Units by mouth daily.     . COD LIVER OIL PO Take 1 capsule by mouth daily.    Marland Kitchen ibuprofen (ADVIL,MOTRIN) 200 MG tablet Take 200 mg by mouth every 6 (six) hours as needed for mild pain.    . Lactobacillus Rhamnosus, GG, (CULTURELLE PO) Take by mouth.    . loratadine (CLARITIN) 10 MG tablet Take 10 mg by mouth daily.    . magnesium 30 MG tablet Take 500 mg by mouth daily.     . Multiple Vitamins-Minerals (MULTIVITAMIN  WITH MINERALS) tablet Take 1 tablet by mouth daily.    . Omega-3 Fatty Acids (F[place] OIL PO) Take 1 capsule by mouth daily.     . Phenazopyridine HCl (AZO TABS PO) Take by mouth as needed.     . tamoxifen (NOLVADEX) 20 MG tablet Take 1 tablet (20 mg total) daily by mouth. 30 tablet 4  . valACYclovir (VALTREX) 500 MG tablet TK 1 T PO D  4  . venlafaxine (EFFEXOR) 75 MG tablet Take 75 mg by mouth daily.     No current facility-administered medications on file prior to visit.     No Known Allergies  Objective:  General: Alert and oriented x3 in no acute distress  Dermatology: No open lesions bilateral lower extremities, Dry blood blister to right 3rd toe with mild purple and red discoloration, no signs of infection, no webspace macerations, no ecchymosis bilateral, all nails x 10 are well manicured.  Vascular: Dorsalis Pedis and Posterior Tibial pedal pulses palpable, Capillary Fill Time 3 seconds,(+) pedal hair growth bilateral, no edema bilateral lower extremities, Temperature gradient cool. No signs of ischemia or gangrene.   Neurology: Johney Maine sensation intact via light touch bilateral.  Musculoskeletal: Minimal tenderness with palpation at right 3rd toe,No pain with calf compression bilateral. No gross bony defomities. Strength within normal limits in all groups bilateral.   Assessment and Plan: Problem List Items Addressed This  Visit    None    Visit Diagnoses    Discoloration of skin of toe    -  Primary   Blister of toe of right foot, initial encounter       Raynaud's syndrome without gangrene       Relevant Orders   POCT ABI Screening for [place] No Charge   Toe pain, right           -Complete examination performed -Discussed treatement options for resolving blister and raynauds -Recommend supportive therapy and OTC lidocaine to toes and keeping toes warm and closely monitoring  -ABI were performed in office and are within normal limits -Patient to return to office as needed  or sooner if condition worsens.  Landis Martins, DPM

## 2017-10-05 NOTE — Telephone Encounter (Signed)
Dr. Cannon Kettle states in-office ABI are normal.

## 2017-10-05 NOTE — Telephone Encounter (Signed)
Left message for pt to call for circulation results.

## 2017-10-19 ENCOUNTER — Ambulatory Visit: Payer: Commercial Managed Care - PPO | Admitting: Hematology

## 2017-10-19 ENCOUNTER — Other Ambulatory Visit: Payer: Commercial Managed Care - PPO

## 2017-10-26 ENCOUNTER — Telehealth: Payer: Self-pay | Admitting: Hematology

## 2017-10-26 ENCOUNTER — Inpatient Hospital Stay (HOSPITAL_BASED_OUTPATIENT_CLINIC_OR_DEPARTMENT_OTHER): Payer: Commercial Managed Care - PPO | Admitting: Hematology

## 2017-10-26 ENCOUNTER — Encounter: Payer: Self-pay | Admitting: Hematology

## 2017-10-26 ENCOUNTER — Inpatient Hospital Stay: Payer: Commercial Managed Care - PPO | Attending: Hematology

## 2017-10-26 VITALS — BP 149/95 | HR 113 | Temp 98.4°F | Resp 16 | Ht 69.0 in | Wt 191.6 lb

## 2017-10-26 DIAGNOSIS — I10 Essential (primary) hypertension: Secondary | ICD-10-CM | POA: Diagnosis not present

## 2017-10-26 DIAGNOSIS — F329 Major depressive disorder, single episode, unspecified: Secondary | ICD-10-CM | POA: Insufficient documentation

## 2017-10-26 DIAGNOSIS — Z170 Estrogen receptor positive status [ER+]: Secondary | ICD-10-CM

## 2017-10-26 DIAGNOSIS — F32A Depression, unspecified: Secondary | ICD-10-CM

## 2017-10-26 DIAGNOSIS — C50411 Malignant neoplasm of upper-outer quadrant of right female breast: Secondary | ICD-10-CM | POA: Insufficient documentation

## 2017-10-26 LAB — COMPREHENSIVE METABOLIC PANEL
ALK PHOS: 49 U/L (ref 40–150)
ALT: 25 U/L (ref 0–55)
AST: 21 U/L (ref 5–34)
Albumin: 4.3 g/dL (ref 3.5–5.0)
Anion gap: 12 — ABNORMAL HIGH (ref 3–11)
BUN: 11 mg/dL (ref 7–26)
CALCIUM: 9.2 mg/dL (ref 8.4–10.4)
CO2: 21 mmol/L — ABNORMAL LOW (ref 22–29)
CREATININE: 0.83 mg/dL (ref 0.60–1.10)
Chloride: 105 mmol/L (ref 98–109)
Glucose, Bld: 90 mg/dL (ref 70–140)
Potassium: 3.9 mmol/L (ref 3.5–5.1)
Sodium: 138 mmol/L (ref 136–145)
Total Bilirubin: 0.5 mg/dL (ref 0.2–1.2)
Total Protein: 7.4 g/dL (ref 6.4–8.3)

## 2017-10-26 LAB — CBC WITH DIFFERENTIAL/PLATELET
Basophils Absolute: 0 10*3/uL (ref 0.0–0.1)
Basophils Relative: 0 %
EOS PCT: 2 %
Eosinophils Absolute: 0.1 10*3/uL (ref 0.0–0.5)
HCT: 40.1 % (ref 34.8–46.6)
Hemoglobin: 13.9 g/dL (ref 11.6–15.9)
LYMPHS ABS: 1.8 10*3/uL (ref 0.9–3.3)
Lymphocytes Relative: 24 %
MCH: 33.5 pg (ref 25.1–34.0)
MCHC: 34.7 g/dL (ref 31.5–36.0)
MCV: 96.6 fL (ref 79.5–101.0)
MONOS PCT: 5 %
Monocytes Absolute: 0.4 10*3/uL (ref 0.1–0.9)
Neutro Abs: 5.5 10*3/uL (ref 1.5–6.5)
Neutrophils Relative %: 69 %
PLATELETS: 334 10*3/uL (ref 145–400)
RBC: 4.15 MIL/uL (ref 3.70–5.45)
RDW: 12.4 % (ref 11.2–14.5)
WBC: 7.8 10*3/uL (ref 3.9–10.3)

## 2017-10-26 NOTE — Telephone Encounter (Signed)
Appointments scheduled/ AVS/Calendar mailed to patient.  Order for mammogram faxed to [facility] per 3/4 los

## 2017-10-26 NOTE — Progress Notes (Signed)
[place]  [phone] [phone]) [phone]  Clinic Follow Up Note   Patient Care Team: [name], MD as PCP - General (Family Medicine) Brien Few, MD as Consulting Physician (Obstetrics and Gynecology) Artelia Laroche, CNM as Midwife (Obstetrics and Gynecology) Excell Seltzer, MD as Consulting Physician (General Surgery) [name], MD as Consulting Physician (Hematology) Gery Pray, MD as Consulting Physician (Radiation Oncology)   Date of Service:  10/26/2017  CHIEF COMPLAINT:  Follow up right breast cancer  Oncology History   Breast cancer of upper-outer quadrant of right female breast [facility])   Staging form: Breast, AJCC 7th Edition   - Clinical stage from 04/23/2016: Stage IA (T1c(m), N0, M0) - Signed by [name], MD on 04/30/2016   - Pathologic stage from 05/27/2016: Stage IIA (T2(m), N0, cM0) - Unsigned      Breast cancer of upper-outer quadrant of right female breast ([place])   04/17/2016 Mammogram    Mammogram and US showed a 2.2cm cyst at 12:00, a 1.6cm lobulated mass at 9:00 and a 53m mass at 10:00, axilla was negative       04/23/2016 Initial Diagnosis    [facility] [place]      04/23/2016 Initial Biopsy    Core needle biopsy of right breast masses at 10:00 and 9:30 positions both showed invasive and insitu mammary carcinoma       04/23/2016 Receptors her2    ER 90%+, PR 80-90%+, HER2-, KI67 10-15%      05/12/2016 Procedure    Genetic testing: Negative. Genes analyzed: ATM, BAP1, BARD1, BRCA1, BRCA2, BRIP1, CDH1, CDK4, CDKN2A, CHEK2, EPCAM, FANCC, MITF, MLH1, MSH2, MSH6, NBN, PALB2, PMS2, PTEN, RAD51C, RAD51D, TP53, and XRCC2      05/27/2016 Surgery    Right breast mastectomy and sentinel lymph node biopsy ([facility])      05/27/2016 Pathology Results    Right breast mastectomy showed multifocal invasive and in situ lobular carcinoma, 3.2, 0.9 and 0.9 cm, G1 margins were  negative, 3 sentinel lymph nodes were negative.       05/27/2016 Oncotype testing    RS 18, which predicts 10 year distant recurrence risk of 12% with tamoxifen (intermediate risk)       11/23/2016 -  Anti-estrogen oral therapy    Tamoxifen 20 mg daily, she started with 191mdaily for the first month         HISTORY OF PRESENTING ILLNESS (04/30/2016):  Caitlin Fickle89.o. female is here because of her recently diagnosed right breast cancer. She is accompanied by her friend and sister to our multidisciplinary breast clinic today.   She felt a breast lump about a few weeks ago. She denies any breast pain, skin change or nipple discharge. She had normal screening mammogram in April 2017. She was seen by her primary care physician for the breast mass, and underwent diagnostic mammogram and ultrasound on 04/17/2016, which showed a 2.2 cm cyst at 12:00 position, a 1.6 cm lobulated mass at 9:00 and a 6 mm mass at 10:00 position, ultrasound of the axilla was negative. She underwent ultrasound guided core needle biopsy of both right breast mass, which all showed invasive lobular carcinoma, ER/PR strongly positive, HER-2 negative.  She feels very well overall, denies any pain or other symptoms. She is physically active, has been quite anxious since her cancer diagnosis.  GYN HISTORY  Menarchal: 12 LMP: [age]hysrectomy)  Contraceptive: 10, stopped in 30's  HRT: none  G1P1: one daughter, 67 yo   CURRENT THERAPY: adjuvant Tamoxifen 6m dialy, she started on 11/23/2016 and had 152mdaily for the first month   INTERIM HISTORY:  Caitlin Monroe for follow-up of her right breast cancer. She was last sen by me 6 months ago. She presents to the clinic today noting elevated BP at home. She is on low dose amlodipine by Dr. GrCarlota RaspberryShe is taking low dose Effexor and she has not been on Tamoxifen for a month. She will restart Tamoxifen this week.  She has been working [employer]. 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 Since her boards she has been trying to relax more and lessen her stress.   On review of symptoms, pt weight gaining. She denies hot flashes and mood swings while off Tamoxifen.    MEDICAL HISTORY:  Past Medical History:  Diagnosis Date  . Anxiety   . Breast [place] of [place] of right [place] breast [place]  . Depression   . Family history of breast cancer   . Family history of prostate cancer   . Hypertension    "because of stress" (05/27/2016)  . PONV (postoperative nausea and vomiting)   . Seasonal allergies     SURGICAL HISTORY: Past Surgical History:  Procedure Laterality Date  . ABDOMINAL HYSTERECTOMY  Aug 2011   [employer] TAH for fibroids  . BREAST BIOPSY  03/2016  . FOOT NEUROMA SURGERY Right 2000?  . Marland KitchenASTECTOMY COMPLETE / SIMPLE W/ SENTINEL NODE BIOPSY Right 05/27/2016  . MASTECTOMY W/ SENTINEL NODE BIOPSY Right 05/27/2016   Procedure: RIGHT TOTAL MASTECTOMY WITH AXILLARY SENTINEL LYMPH NODE BIOPSY;  Surgeon: BeExcell SeltzerMD;  [facility] Service: General;  Laterality: Right;  . TONSILLECTOMY    . WISDOM TOOTH EXTRACTION  ~ 1985    SOCIAL HISTORY: Social History   Socioeconomic History  . Marital status: Legally Separated    Spouse name: Not on file  . Number of children: Not on file  . Years of education: Not on file  . Highest education level: Not on file  Social Needs  . Financial resource strain: Not on file  . Food insecurity - worry: Not on file  . Food insecurity - inability: Not on file  . Transportation needs - medical: Not on file  . Transportation needs - non-medical: Not on file  Occupational History  . Occupation: LaEducation administratorTobacco Use  . Smoking status: Former Smoker    Packs/day: 0.50    Years: 15.00    Pack years: 7.50    Types: Cigarettes    Last attempt to quit: 06/23/1995    Years since quitting: 22.3  .  Smokeless tobacco: Never Used  Substance and Sexual Activity  . Alcohol use: No    Alcohol/week: 0.0 oz    Comment: 05/27/2016 "nothing since 1996"  . Drug use: No    Comment: 05/27/2016 "nothing since 1996"  . Sexual activity: Yes  Other Topics Concern  . Not on file  Social History Narrative   Married. Education: [employer]ercise: Gym 2 times a week fit walking.                      FAMILY HISTORY: Family History  Problem Relation Age of Onset  . Hypertension Mother   . Depression Mother   . Hyperlipidemia Mother   . Heart  disease Mother   . Prostate cancer Father 18  . Mental illness Sister   . Hypertension Sister   . Prostate cancer Brother        dx in his late 49s  . Mental illness Maternal Grandmother   . Breast cancer Maternal Grandmother        dx in her 13s  . Lung cancer Maternal Grandmother        smoker since age 41; dx in her 39s  . Cancer Maternal Grandfather   . Heart disease Maternal Grandfather   . Mental illness Maternal Grandfather   . Lung cancer Paternal Grandmother        dx in her 69s, life long smoker  . Prostate cancer Paternal Grandfather   . Allergies Daughter   . Anxiety disorder Daughter   . Melanoma Brother 50  . Bladder Cancer Sister 51    ALLERGIES:  has No Known Allergies.  MEDICATIONS:  Current Outpatient Medications  Medication Sig Dispense Refill  . amLODipine (NORVASC) 2.5 MG tablet Take 1 tablet (2.5 mg total) daily by mouth. 90 tablet 1  . amphetamine-dextroamphetamine (ADDERALL) 10 MG tablet TK 1 T PO  QAM  0  . cholecalciferol (VITAMIN D) 1000 UNITS tablet Take 6,000 Units by mouth daily.     . COD LIVER OIL PO Take 1 capsule by mouth daily.    Marland Kitchen ibuprofen (ADVIL,MOTRIN) 200 MG tablet Take 200 mg by mouth every 6 (six) hours as needed for mild pain.    . Lactobacillus Rhamnosus, GG, (CULTURELLE PO) Take by mouth.    . loratadine (CLARITIN) 10 MG tablet Take 10 mg by mouth daily.    . magnesium 30 MG tablet Take 500  mg by mouth daily.     . Multiple Vitamins-Minerals (MULTIVITAMIN WITH MINERALS) tablet Take 1 tablet by mouth daily.    . Omega-3 Fatty Acids (FISH OIL PO) Take 1 capsule by mouth daily.     . Phenazopyridine HCl (AZO TABS PO) Take by mouth as needed.     . valACYclovir (VALTREX) 500 MG tablet TK 1 T PO D  4  . venlafaxine (EFFEXOR) 75 MG tablet Take 75 mg by mouth daily.    . tamoxifen (NOLVADEX) 20 MG tablet Take 1 tablet (20 mg total) daily by mouth. (Patient not taking: Reported on 10/26/2017) 30 tablet 4   No current facility-administered medications for this visit.     REVIEW OF SYSTEMS:   Constitutional: Denies fevers, chills or abnormal night sweats (+) weight gain Eyes: Denies blurriness of vision, double vision or watery eyes Ears, nose, mouth, throat, and face: Denies mucositis or sore throat Respiratory: Denies cough, dyspnea or wheezes Cardiovascular: Denies palpitation, chest discomfort or lower extremity swelling Gastrointestinal:  Denies nausea, heartburn or change in bowel habits Skin: Denies abnormal skin rashes  Lymphatics: Denies new lymphadenopathy or easy bruising Neurological:Denies numbness, tingling or new weaknesses Behavioral/Psych: Mood is stable, no new changes  All other systems were reviewed with the patient and are negative.  PHYSICAL EXAMINATION:  ECOG PERFORMANCE STATUS: 0 - Asymptomatic  Vitals:   10/26/17 1129  BP: (!) 149/95  Pulse: (!) 113  Resp: 16  Temp: 98.4 F (36.9 C)  SpO2: 100%   Filed Weights   10/26/17 1129  Weight: 191 lb 9.6 oz (86.9 kg)    GENERAL:alert, no distress and comfortable SKIN: skin color, texture, turgor are normal, no rashes or significant lesions EYES: normal, conjunctiva are pink and non-injected, sclera clear OROPHARYNX:no exudate,  no erythema and lips, buccal mucosa, and tongue normal  NECK: supple, thyroid normal size, non-tender, without nodularity LYMPH:  no palpable lymphadenopathy in the cervical,  axillary or inguinal LUNGS: clear to auscultation and percussion with normal breathing effort HEART: regular rate & rhythm and no murmurs and no lower extremity edema ABDOMEN:abdomen soft, non-tender and normal bowel sounds Musculoskeletal:no cyanosis of digits and no clubbing  PSYCH: alert & oriented x 3 with fluent speech NEURO: no focal motor/sensory deficits Breasts: Right breast is surgically absent, the incision is well healed, no palpable abnormalities, discharge or skin discoloration. Palpation of the left beast and axilla revealed no obvious mass that I could appreciate.   LABORATORY DATA:  I have reviewed the data as listed CBC Latest Ref Rng & Units 10/26/2017 04/16/2017 12/12/2016  WBC 3.9 - 10.3 K/uL 7.8 7.0 6.2  Hemoglobin 11.6 - 15.9 g/dL 13.9 13.1 13.9  Hematocrit 34.8 - 46.6 % 40.1 38.3 40.3  Platelets 145 - 400 K/uL 334 297 356   CMP Latest Ref Rng & Units 10/26/2017 04/16/2017 12/12/2016  Glucose 70 - 140 mg/dL 90 95 94  BUN 7 - 26 mg/dL 11 15.9 14.0  Creatinine 0.60 - 1.10 mg/dL 0.83 0.8 0.8  Sodium 136 - 145 mmol/L 138 138 140  Potassium 3.5 - 5.1 mmol/L 3.9 4.5 4.1  Chloride 98 - 109 mmol/L 105 - -  CO2 22 - 29 mmol/L 21(L) 24 24  Calcium 8.4 - 10.4 mg/dL 9.2 8.9 9.1  Total Protein 6.4 - 8.3 g/dL 7.4 7.1 7.1  Total Bilirubin 0.2 - 1.2 mg/dL 0.5 0.60 0.65  Alkaline Phos 40 - 150 U/L 49 38(L) 46  AST 5 - 34 U/L '21 17 22  ' ALT 0 - 55 U/L '25 14 17   ' PATHOLOGY REPORT  Diagnosis 04/23/2016 1. Breast, right, needle core biopsy, 10 o'clock - INVASIVE AND IN SITU MAMMARY CARCINOMA. 2. Breast, right, needle core biopsy, 9:30 o'clock - INVASIVE AND IN SITU MAMMARY CARCINOMA.  Microscopic Comment 1. , 2. E-Cadherin and breast prognostic profile will be performed.  1. Results: IMMUNOHISTOCHEMICAL AND MORPHOMETRIC ANALYSIS PERFORMED MANUALLY Estrogen Receptor: 90%, POSITIVE, STRONG STAINING INTENSITY Progesterone Receptor: 80%, POSITIVE, STRONG STAINING  INTENSITY Proliferation Marker Ki67: 10%  Results: HER2 - NEGATIVE RATIO OF HER2/CEP17 SIGNALS 1.60 AVERAGE HER2 COPY NUMBER PER CELL 2.00  2. PROGNOSTIC INDICATORS Results: IMMUNOHISTOCHEMICAL AND MORPHOMETRIC ANALYSIS PERFORMED MANUALLY Estrogen Receptor: 90%, POSITIVE, STRONG STAINING INTENSITY Progesterone Receptor: 90%, POSITIVE, STRONG STAINING INTENSITY Proliferation Marker Ki67: 15%  Results: HER2 - NEGATIVE RATIO OF HER2/CEP17 SIGNALS 1.36 AVERAGE HER2 COPY NUMBER PER CELL 1.70  Diagnosis 05/27/2016 1. Breast, simple mastectomy, Right - MULTIFOCAL INVASIVE AND IN SITU LOBULAR CARCINOMA, 3.2, 0.9, AND 0.9 CM. - MULTIFOCAL LOBULAR CARCINOMA IN SITU. - MARGINS NOT INVOLVED. - FIBROCYSTIC CHANGES WITH PSEUDOANGIOMATOUS STROMAL HYPERPLASIA. - PREVIOUS BIOPSY SITES. 2. Lymph node, sentinel, biopsy, Right axillary #1 - ONE BENIGN LYMPH NODE (0/1). 3. Lymph node, sentinel, biopsy, Right axillary #2 - ONE BENIGN LYMPH NODE (0/1). 4. Lymph node, sentinel, biopsy, Right axillary #3 - ONE BENIGN LYMPH NODE (0/1). Microscopic Comment 1. BREAST, INVASIVE TUMOR, WITH LYMPH NODES PRESENT Specimen, including laterality and lymph node sampling (sentinel, non-sentinel): Right breast and three sentinel lymph nodes. Procedure: Mastectomy and sentinel lymph node biopsies. Histologic type: Lobular. Grade: 1 Tubule formation: 3 Nuclear pleomorphism: 1 Mitotic:1 Tumor size (gross measurement): 3.2, 0.9, and 0.9 cm Margins: Free of tumor. Invasive, distance to closest margin: 0.9 cm from anterior margin. In-situ, distance to  closest margin: 0.9 cm from anterior margin. If margin positive, focally or broadly: N/A Lymphovascular invasion: Not identified. Ductal carcinoma in situ: No. Grade: N/A 1 of 4 FINAL for Zajkowski, Lunah C ([id number]) Microscopic Comment(continued) Extensive intraductal component: N/A Lobular neoplasia: Yes, lobular carcinoma in situ. Tumor focality:  Multifocal. Extent of tumor: Skin: Free of tumor Nipple: Free of tumor. Skeletal muscle: Free of tumor. Lymph nodes: Examined: 3 Sentinel 0 Non-sentinel 3 Total Lymph nodes with metastasis: 0 Isolated tumor cells (< 0.2 mm): 0 Micrometastasis: (> 0.2 mm and < 2.0 mm): 0 Macrometastasis: (> 2.0 mm): 0 Extracapsular extension: N/A Breast prognostic profile: Case [id number]. Estrogen receptor: 90%, positive, strong staining. Progesterone receptor: 90%, positive, strong staining. Her 2 neu: Negative, ratio 1.36 and 1.6. Ki-67: 10% and 15%. Non-neoplastic breast: Fibrocystic changes and pseudoangiomatous stromal hyperplasia. TNM: pT2(multifocal), pN0, pMX Comments: There are three nodules of invasive and in situ lobular carcinoma which are 3.2, 0.9, and 0.9 cm. There are also separate foci of lobular carcinoma in situ. The margins of the specimen are not involved by invasive or in situ lobular carcinoma. Immunohistochemistry for cytokeratin AE1/AE3 is performed on all of the sentinel lymph nodes and no positivity is identified. (JDP:gt, 05/29/16)  Oncotype Dx RS 18, which predicts 10 year distant recurrence risk of 12% with tamoxifen  RADIOGRAPHIC STUDIES: I have personally reviewed the radiological images as listed and agreed with the findings in the report. No results found.   Breast MRI b/l in 07/29/17  IMPRESSION: 1. No MR evidence of breast malignancy. 2. Status post right mastectomy. RECOMMENDATION: Left screening mammogram in 1 year.  ASSESSMENT & PLAN:  52 y.o. Caucasian female, no significant past medical history, presented with a palpable right breast mass  1. Breast cancer of upper outer quadrant of right breast, invasive lobular carcinoma, mpT2N0M0, stage IIA, ER+/PR+/HER2-, G1, RS 18  --I previously discussed her surgical path result in details. -She had multifocal disease, surgical margins were negative. Nodes were negative. -the Oncotype Dx result was previously  with her in details. She has intermedia risk based on the recurrence score, which predicts 10 year distant recurrence after 5 years of tamoxifen 12%. The benefit of chemotherapy in the intermedia risk group is small and controversial. Given her relatively low score in the intermedia group, I did not recommend adjuvant chemotherapy. She agrees with the plan -Giving the strong ER and PR expression in her tumor, I recommend adjuvant endocrine therapy -Her [place] and estradiol level showed she is still premenopausal -She has finally started adjuvant tamoxifen, tolerated full dose 25m daily well.  -She is not interested in breast reconstruction surgery at this time. -She is clinically doing well. Lab reviewed, her CBC and CMP are within normal limits. Her physical exam and her 04/2017 mammogram and 07/29/17 Breast MRI were unremarkable. There is no clinical concern for recurrence.  -Continue annual screening mammogram of left breast, next in 04/2018 -Due to her dense breast tissue (category D), she may benefit from screen MRI, She is agreeable. Next in 07/2018. If she has to continue to pay a lot out of pocket she is fine to do MRI every other year.  -She held tamoxifen for her exam a few months ago, ready to restart Tamoxifen this week -It has been 1.5 years since her diagnosis. Continue breast cancer surveillance. I will see her in 4 months then every 6 months afterward.  2. Bone health -We previously discussed the effect on bone density from tamoxifen or aromatase inhibitor -I  encouraged her to take calcium and vitamin D  3. Hypertension, depression  -She has stopped Wellbutrin, and has stopped Zoloft -Continue follow-up with her primary care physician  -She is currently on low dose amlodpaine. Given her elevated BP at home I suggest she see Dr. Carlota Raspberry about increasing her Amlodpaine.  -She has started taking Effexor and is feeling well. If her mood and hot flashes increase with Tamoxifen she is fine to  increase her Effexor. I recommend she also gets counseling.     Plan -She will restart her tamoxifen this week  -lab and f/u in 4 months  -[facility], will order breast MRI on her next visit.   Orders Placed This Encounter  Procedures  . MM DIAG BREAST TOMO UNI LEFT    Standing Status:   Future    Standing Expiration Date:   10/27/2018    Scheduling Instructions:     [facility]    Order Specific Question:   Reason for Exam (SYMPTOM  OR DIAGNOSIS REQUIRED)    Answer:   screening    Order Specific Question:   Is the patient pregnant?    Answer:   No    Order Specific Question:   Preferred imaging location?    Answer:   External    All questions were answered. The patient knows to call the clinic with any problems, questions or concerns. I spent 15 minutes counseling the patient face to face. The total time spent in the appointment was 20 minutes and more than 50% was on counseling.  This document serves as a record of services personally performed by [name], MD. It was created on her behalf by Joslyn Devon, a trained medical scribe. The creation of this record is based on the scribe's personal observations and the provider's statements to them.    I have reviewed the above documentation for accuracy and completeness, and I agree with the above.    [name], MD 10/26/2017

## 2017-10-27 ENCOUNTER — Encounter: Payer: Self-pay | Admitting: Family Medicine

## 2017-10-30 ENCOUNTER — Telehealth: Payer: Self-pay | Admitting: Hematology

## 2017-10-30 NOTE — Telephone Encounter (Signed)
Patients screening mammogram is scheduled for 04/29/18 @ 10:15 @ [facility] per phone call to [place] today.  Per 3/4 los

## 2017-11-19 ENCOUNTER — Ambulatory Visit: Payer: Self-pay | Admitting: Family Medicine

## 2017-11-26 ENCOUNTER — Encounter: Payer: Self-pay | Admitting: Family Medicine

## 2017-11-26 ENCOUNTER — Ambulatory Visit: Payer: Commercial Managed Care - PPO | Admitting: Family Medicine

## 2017-11-26 VITALS — BP 157/99 | HR 105 | Temp 98.5°F | Ht 69.0 in | Wt 189.2 lb

## 2017-11-26 DIAGNOSIS — I10 Essential (primary) hypertension: Secondary | ICD-10-CM | POA: Diagnosis not present

## 2017-11-26 MED ORDER — AMLODIPINE BESYLATE 5 MG PO TABS: 5.0000 mg | ORAL_TABLET | Freq: Every day | ORAL | 1 refills | Status: DC

## 2017-11-26 NOTE — Progress Notes (Signed)
By signing my name below, I, Theresia Bough, attest that this documentation has been prepared under the direction and in the presence of Carlota Raspberry Ranell Patrick, MD.  Electronically Signed: Theresia Bough, Medical Scribe 11/26/17 at 5:33 PM  Subjective:    Patient ID: Caitlin Monroe, female    DOB: 1965-09-05, 52 y.o.   MRN: [id number] Chief Complaint  Patient presents with  . Hypertension    f/u and med management    HPI Caitlin Monroe is a 51 y.o. female who presents to Primary Care at [facility] for follow up of her hypertension. I last saw her in Nov 2018, at that time BP 148/90. She has a remote hx of palpitations, treated with beta blocker. In November, she had no further palpitations and we started amlodipine 2.5 mg QD with home monitoring.   BP Readings from Last 3 Encounters:  11/26/17 (!) 157/99  10/26/17 (!) 149/95  10/05/17 (!) 140/98   Lab Results  Component Value Date   CREATININE 0.83 10/26/2017   Patient email on 3/5 informed me that her BP has been high with average BP 147/100. She increased the amlodipine to 5 mg QD on 10/26/17.   Today, she notes her BP has still been high even on the higher dose of amlodipine. However, she has ran out of the medication 2 weeks ago. She does report that she started taking Lavender and she thinks this has decreased her BP. Her readings from the time she was taking lavender and 5 mg amlodipine. Her reports were 122/88, 121/85, and 112/82. She does report that she finds out her scores for the [facility] tomorrow.   On review of systems, pt denies palpitations, leg swelling, CP, dizziness or any other complaints at this time. Pertinent positives are listed and detailed within the above HPI.   Patient Active Problem List   Diagnosis Date Noted  . Breast cancer, right ([place]) 05/27/2016  . Genetic testing 05/13/2016  . Invasive carcinoma of breast ([place]) 05/06/2016  . Family history of breast cancer   . Family history of prostate cancer   . Breast  cancer of upper-outer quadrant of right female breast ([place]) 04/25/2016  . Depression 06/27/2015  . Seasonal affective disorder ([place]) 06/27/2015  . Allergic rhinitis 04/05/2013  . Depression with anxiety 04/05/2013   Past Medical History:  Diagnosis Date  . Anxiety   . Breast cancer of upper-outer quadrant of right female breast ([place]) 04/25/2016  . Depression   . Family history of breast cancer   . Family history of prostate cancer   . Hypertension    "because of stress" (05/27/2016)  . PONV (postoperative nausea and vomiting)   . Seasonal allergies    Past Surgical History:  Procedure Laterality Date  . ABDOMINAL HYSTERECTOMY  Aug 2011   [facility] TAH for fibroids  . BREAST BIOPSY  03/2016  . FOOT NEUROMA SURGERY Right 2000?  [facility] Kitchen MASTECTOMY COMPLETE / SIMPLE W/ SENTINEL NODE BIOPSY Right 05/27/2016  . MASTECTOMY W/ SENTINEL NODE BIOPSY Right 05/27/2016   Procedure: RIGHT TOTAL MASTECTOMY WITH AXILLARY SENTINEL LYMPH NODE BIOPSY;  Surgeon: Excell Seltzer, MD;  Location: [facility];  Service: General;  Laterality: Right;  . TONSILLECTOMY    . WISDOM TOOTH EXTRACTION  ~ 1985   No Known Allergies Prior to Admission medications   Medication Sig Start Date End Date Taking? Authorizing Provider  amLODipine (NORVASC) 2.5 MG tablet Take 1 tablet (2.5 mg total) daily by mouth. 06/30/17   [name], MD  amphetamine-dextroamphetamine (ADDERALL) 10 MG tablet TK 1 T PO  QAM 06/25/17   [provider]  cholecalciferol (VITAMIN D) 1000 UNITS tablet Take 6,000 Units by mouth daily.     [provider]  COD LIVER OIL PO Take 1 capsule by mouth daily.    [provider]  ibuprofen (ADVIL,MOTRIN) 200 MG tablet Take 200 mg by mouth every 6 (six) hours as needed for mild pain.    [provider]  Lactobacillus Rhamnosus, GG, (CULTURELLE PO) Take by mouth.    [provider]  loratadine (CLARITIN) 10 MG tablet Take 10 mg by mouth daily.    [provider]  magnesium 30 MG tablet Take 500 mg by mouth daily.     [provider]  Multiple Vitamins-Minerals (MULTIVITAMIN WITH MINERALS) tablet Take 1 tablet by mouth daily.    [provider]  Omega-3 Fatty Acids (FISH OIL PO) Take 1 capsule by mouth daily.     [provider]  Phenazopyridine HCl (AZO TABS PO) Take by mouth as needed.     [provider]  tamoxifen (NOLVADEX) 20 MG tablet Take 1 tablet (20 mg total) daily by mouth. Patient not taking: Reported on 10/26/2017 06/29/17   [name], MD  valACYclovir (VALTREX) 500 MG tablet TK 1 T PO D 08/30/17   [provider]  venlafaxine (EFFEXOR) 75 MG tablet Take 75 mg by mouth daily.    [provider]   Social History   Socioeconomic History  . Marital status: Legally Separated    Spouse name: Not on file  . Number of children: Not on file  . Years of education: Not on file  . Highest education level: Not on file  Occupational History  . Occupation: Education administrator  Social Needs  . Financial resource strain: Not on file  . Food insecurity:    Worry: Not on file    Inability: Not on file  . Transportation needs:    Medical: Not on file    Non-medical: Not on file  Tobacco Use  . Smoking status: Former Smoker    Packs/day: 0.50    Years: 15.00    Pack years: 7.50    Types: Cigarettes    Last attempt to quit: 06/23/1995    Years since quitting: 22.4  . Smokeless tobacco: Never Used  Substance and Sexual Activity  . Alcohol use: No    Alcohol/week: 0.0 oz    Comment: 05/27/2016 "nothing since 1996"  . Drug use: No    Comment: 05/27/2016 "nothing since 1996"  . Sexual activity: Yes  Lifestyle  . Physical activity:    Days per week: Not on file    Minutes per session: Not on file  . Stress: Not on file  Relationships  . Social connections:    Talks on phone: Not on file    Gets together: Not on file    Attends religious service: Not on file    Active member of club  or organization: Not on file    Attends meetings of clubs or organizations: Not on file    Relationship status: Not on file  . Intimate partner violence:    Fear of current or ex partner: Not on file    Emotionally abused: Not on file    Physically abused: Not on file    Forced sexual activity: Not on file  Other Topics Concern  . Not on file  Social History Narrative   Married. Education: [employer].  Exercise: Gym 2 times a week fit walking.                      Review of Systems  Constitutional: Negative for fatigue and unexpected weight change.  Respiratory: Negative for chest tightness and shortness of breath.   Cardiovascular: Negative for chest pain, palpitations and leg swelling.  Gastrointestinal: Negative for abdominal pain and blood in stool.  Neurological: Negative for dizziness, syncope, light-headedness and headaches.       Objective:   Physical Exam  Constitutional: She is oriented to person, place, and time. She appears well-developed and well-nourished.  HENT:  Head: Normocephalic and atraumatic.  Eyes: Pupils are equal, round, and reactive to light. Conjunctivae and EOM are normal.  Neck: Carotid bruit is not present.  Cardiovascular: Normal rate, regular rhythm, normal heart sounds and intact distal pulses.  Pulmonary/Chest: Effort normal and breath sounds normal.  Abdominal: Soft. She exhibits no pulsatile midline mass. There is no tenderness.  Neurological: She is alert and oriented to person, place, and time.  Skin: Skin is warm and dry.  Psychiatric: She has a normal mood and affect. Her behavior is normal.  Vitals reviewed.   Vitals:   11/26/17 1659  BP: (!) 157/99  Pulse: (!) 105  Temp: 98.5 F (36.9 C)  TempSrc: Oral  SpO2: 96%  Weight: 189 lb 3.2 oz (85.8 kg)  Height: 5\' 9"  (1.753 m)       Assessment & Plan:    Caitlin Monroe is a 52 y.o. female Essential hypertension - Plan: amLODipine (NORVASC) 5 MG tablet  -Home readings  recently on herbal supplementation plus amlodipine appear to be better controlled.  Continue amlodipine 5 mg daily for now, monitor home readings.  Handout given on hypertension Meds ordered this encounter  Medications  . amLODipine (NORVASC) 5 MG tablet    Sig: Take 1 tablet (5 mg total) by mouth daily.    Dispense:  90 tablet    Refill:  1   Patient Instructions    Restart amlodipine at 5mg  per day, Keep a record of your blood pressures outside of the office and if over 140/90, let me know.   Hypertension Hypertension, commonly called high blood pressure, is when the force of blood pumping through the arteries is too strong. The arteries are the blood vessels that carry blood from the heart throughout the body. Hypertension forces the heart to work harder to pump blood and may cause arteries to become narrow or stiff. Having untreated or uncontrolled hypertension can cause heart attacks, strokes, kidney disease, and other problems. A blood pressure reading consists of a higher number over a lower number. Ideally, your blood pressure should be below 120/80. The first ("top") number is called the systolic pressure. It is a measure of the pressure in your arteries as your heart beats. The second ("bottom") number is called the diastolic pressure. It is a measure of the pressure in your arteries as the heart relaxes. What are the causes? The cause of this condition is not known. What increases the risk? Some risk factors for high blood pressure are under your control. Others are not. Factors you can change  Smoking.  Having type 2 diabetes mellitus, high cholesterol, or both.  Not getting enough exercise or physical activity.  Being overweight.  Having too much fat, sugar, calories, or salt (sodium) in your diet.  Drinking too much alcohol. Factors that are difficult or impossible to change  Having chronic  kidney disease.  Having a family history of high blood pressure.  Age.  Risk increases with age.  Race. You may be at higher risk if you are African-American.  Gender. Men are at higher risk than women before age 67. After age 91, women are at higher risk than men.  Having obstructive sleep apnea.  Stress. What are the signs or symptoms? Extremely high blood pressure (hypertensive crisis) may cause:  Headache.  Anxiety.  Shortness of breath.  Nosebleed.  Nausea and vomiting.  Severe chest pain.  Jerky movements you cannot control (seizures).  How is this diagnosed? This condition is diagnosed by measuring your blood pressure while you are seated, with your arm resting on a surface. The cuff of the blood pressure monitor will be placed directly against the skin of your upper arm at the level of your heart. It should be measured at least twice using the same arm. Certain conditions can cause a difference in blood pressure between your right and left arms. Certain factors can cause blood pressure readings to be lower or higher than normal (elevated) for a short period of time:  When your blood pressure is higher when you are in a health care provider's office than when you are at home, this is called white coat hypertension. Most people with this condition do not need medicines.  When your blood pressure is higher at home than when you are in a health care provider's office, this is called masked hypertension. Most people with this condition may need medicines to control blood pressure.  If you have a high blood pressure reading during one visit or you have normal blood pressure with other risk factors:  You may be asked to return on a different day to have your blood pressure checked again.  You may be asked to monitor your blood pressure at home for 1 week or longer.  If you are diagnosed with hypertension, you may have other blood or imaging tests to help your health care provider understand your overall risk for other conditions. How is this  treated? This condition is treated by making healthy lifestyle changes, such as eating healthy foods, exercising more, and reducing your alcohol intake. Your health care provider may prescribe medicine if lifestyle changes are not enough to get your blood pressure under control, and if:  Your systolic blood pressure is above 130.  Your diastolic blood pressure is above 80.  Your personal target blood pressure may vary depending on your medical conditions, your age, and other factors. Follow these instructions at home: Eating and drinking  Eat a diet that is high in fiber and potassium, and low in sodium, added sugar, and fat. An example eating plan is called the DASH (Dietary Approaches to Stop Hypertension) diet. To eat this way: ? Eat plenty of fresh fruits and vegetables. Try to fill half of your plate at each meal with fruits and vegetables. ? Eat whole grains, such as whole wheat pasta, brown rice, or whole grain bread. Fill about one quarter of your plate with whole grains. ? Eat or drink low-fat dairy products, such as skim milk or low-fat yogurt. ? Avoid fatty cuts of meat, processed or cured meats, and poultry with skin. Fill about one quarter of your plate with lean proteins, such as fish, chicken without skin, beans, eggs, and tofu. ? Avoid premade and processed foods. These tend to be higher in sodium, added sugar, and fat.  Reduce your daily sodium intake. Most people with  hypertension should eat less than 1,500 mg of sodium a day.  Limit alcohol intake to no more than 1 drink a day for nonpregnant women and 2 drinks a day for men. One drink equals 12 oz of beer, 5 oz of wine, or 1 oz of hard liquor. Lifestyle  Work with your health care provider to maintain a healthy body weight or to lose weight. Ask what an ideal weight is for you.  Get at least 30 minutes of exercise that causes your heart to beat faster (aerobic exercise) most days of the week. Activities may include  walking, swimming, or biking.  Include exercise to strengthen your muscles (resistance exercise), such as pilates or lifting weights, as part of your weekly exercise routine. Try to do these types of exercises for 30 minutes at least 3 days a week.  Do not use any products that contain nicotine or tobacco, such as cigarettes and e-cigarettes. If you need help quitting, ask your health care provider.  Monitor your blood pressure at home as told by your health care provider.  Keep all follow-up visits as told by your health care provider. This is important. Medicines  Take over-the-counter and prescription medicines only as told by your health care provider. Follow directions carefully. Blood pressure medicines must be taken as prescribed.  Do not skip doses of blood pressure medicine. Doing this puts you at risk for problems and can make the medicine less effective.  Ask your health care provider about side effects or reactions to medicines that you should watch for. Contact a health care provider if:  You think you are having a reaction to a medicine you are taking.  You have headaches that keep coming back (recurring).  You feel dizzy.  You have swelling in your ankles.  You have trouble with your vision. Get help right away if:  You develop a severe headache or confusion.  You have unusual weakness or numbness.  You feel faint.  You have severe pain in your chest or abdomen.  You vomit repeatedly.  You have trouble breathing. Summary  Hypertension is when the force of blood pumping through your arteries is too strong. If this condition is not controlled, it may put you at risk for serious complications.  Your personal target blood pressure may vary depending on your medical conditions, your age, and other factors. For most people, a normal blood pressure is less than 120/80.  Hypertension is treated with lifestyle changes, medicines, or a combination of both. Lifestyle  changes include weight loss, eating a healthy, low-sodium diet, exercising more, and limiting alcohol. This information is not intended to replace advice given to you by your health care provider. Make sure you discuss any questions you have with your health care provider. Document Released: 08/11/2005 Document Revised: 07/09/2016 Document Reviewed: 07/09/2016 Elsevier Interactive Patient Education  2018 [employer].   IF you received an x-ray today, you will receive an invoice from [facility]. Please contact [facility] at [phone] with questions or concerns regarding your invoice.   IF you received labwork today, you will receive an invoice from [facility]. Please contact [facility] at [phone] with questions or concerns regarding your invoice.   Our billing staff will not be able to assist you with questions regarding bills from these companies.  You will be contacted with the lab results as soon as they are available. The fastest way to get your results is to activate your My Chart account. Instructions are located on the  last page of this paperwork. If you have not heard from [place] regarding the results in 2 weeks, please contact this office.       I personally performed the services described in this documentation, which was scribed in my presence. The recorded information has been reviewed and considered for accuracy and completeness, addended by me as needed, and agree with information above.  Signed,   [name], MD [facility] at [facility].  11/28/17 11:05 PM

## 2017-11-26 NOTE — Patient Instructions (Addendum)
Restart amlodipine at 5mg  per day, Keep a record of your blood pressures outside of the office and if over 140/90, let me know.   Hypertension Hypertension, commonly called high blood pressure, is when the force of blood pumping through the arteries is too strong. The arteries are the blood vessels that carry blood from the heart throughout the body. Hypertension forces the heart to work harder to pump blood and may cause arteries to become narrow or stiff. Having untreated or uncontrolled hypertension can cause heart attacks, strokes, kidney disease, and other problems. A blood pressure reading consists of a higher number over a lower number. Ideally, your blood pressure should be below 120/80. The first ("top") number is called the systolic pressure. It is a measure of the pressure in your arteries as your heart beats. The second ("bottom") number is called the diastolic pressure. It is a measure of the pressure in your arteries as the heart relaxes. What are the causes? The cause of this condition is not known. What increases the risk? Some risk factors for high blood pressure are under your control. Others are not. Factors you can change  Smoking.  Having type 2 diabetes mellitus, high cholesterol, or both.  Not getting enough exercise or physical activity.  Being overweight.  Having too much fat, sugar, calories, or salt (sodium) in your diet.  Drinking too much alcohol. Factors that are difficult or impossible to change  Having chronic kidney disease.  Having a family history of high blood pressure.  Age. Risk increases with age.  Race. You may be at higher risk if you are African-American.  Gender. Men are at higher risk than women before age 18. After age 46, women are at higher risk than men.  Having obstructive sleep apnea.  Stress. What are the signs or symptoms? Extremely high blood pressure (hypertensive crisis) may cause:  Headache.  Anxiety.  Shortness of  breath.  Nosebleed.  Nausea and vomiting.  Severe chest pain.  Jerky movements you cannot control (seizures).  How is this diagnosed? This condition is diagnosed by measuring your blood pressure while you are seated, with your arm resting on a surface. The cuff of the blood pressure monitor will be placed directly against the skin of your upper arm at the level of your heart. It should be measured at least twice using the same arm. Certain conditions can cause a difference in blood pressure between your right and left arms. Certain factors can cause blood pressure readings to be lower or higher than normal (elevated) for a short period of time:  When your blood pressure is higher when you are in a health care provider's office than when you are at home, this is called white coat hypertension. Most people with this condition do not need medicines.  When your blood pressure is higher at home than when you are in a health care provider's office, this is called masked hypertension. Most people with this condition may need medicines to control blood pressure.  If you have a high blood pressure reading during one visit or you have normal blood pressure with other risk factors:  You may be asked to return on a different day to have your blood pressure checked again.  You may be asked to monitor your blood pressure at home for 1 week or longer.  If you are diagnosed with hypertension, you may have other blood or imaging tests to help your health care provider understand your overall risk for other conditions.  How is this treated? This condition is treated by making healthy lifestyle changes, such as eating healthy foods, exercising more, and reducing your alcohol intake. Your health care provider may prescribe medicine if lifestyle changes are not enough to get your blood pressure under control, and if:  Your systolic blood pressure is above 130.  Your diastolic blood pressure is above  80.  Your personal target blood pressure may vary depending on your medical conditions, your age, and other factors. Follow these instructions at home: Eating and drinking  Eat a diet that is high in fiber and potassium, and low in sodium, added sugar, and fat. An example eating plan is called the DASH (Dietary Approaches to Stop Hypertension) diet. To eat this way: ? Eat plenty of fresh fruits and vegetables. Try to fill half of your plate at each meal with fruits and vegetables. ? Eat whole grains, such as whole wheat pasta, brown rice, or whole grain bread. Fill about one quarter of your plate with whole grains. ? Eat or drink low-fat dairy products, such as skim milk or low-fat yogurt. ? Avoid fatty cuts of meat, processed or cured meats, and poultry with skin. Fill about one quarter of your plate with lean proteins, such as fish, chicken without skin, beans, eggs, and tofu. ? Avoid premade and processed foods. These tend to be higher in sodium, added sugar, and fat.  Reduce your daily sodium intake. Most people with hypertension should eat less than 1,500 mg of sodium a day.  Limit alcohol intake to no more than 1 drink a day for nonpregnant women and 2 drinks a day for men. One drink equals 12 oz of beer, 5 oz of wine, or 1 oz of hard liquor. Lifestyle  Work with your health care provider to maintain a healthy body weight or to lose weight. Ask what an ideal weight is for you.  Get at least 30 minutes of exercise that causes your heart to beat faster (aerobic exercise) most days of the week. Activities may include walking, swimming, or biking.  Include exercise to strengthen your muscles (resistance exercise), such as pilates or lifting weights, as part of your weekly exercise routine. Try to do these types of exercises for 30 minutes at least 3 days a week.  Do not use any products that contain nicotine or tobacco, such as cigarettes and e-cigarettes. If you need help quitting, ask  your health care provider.  Monitor your blood pressure at home as told by your health care provider.  Keep all follow-up visits as told by your health care provider. This is important. Medicines  Take over-the-counter and prescription medicines only as told by your health care provider. Follow directions carefully. Blood pressure medicines must be taken as prescribed.  Do not skip doses of blood pressure medicine. Doing this puts you at risk for problems and can make the medicine less effective.  Ask your health care provider about side effects or reactions to medicines that you should watch for. Contact a health care provider if:  You think you are having a reaction to a medicine you are taking.  You have headaches that keep coming back (recurring).  You feel dizzy.  You have swelling in your ankles.  You have trouble with your vision. Get help right away if:  You develop a severe headache or confusion.  You have unusual weakness or numbness.  You feel faint.  You have severe pain in your chest or abdomen.  You vomit repeatedly.  You have trouble breathing. Summary  Hypertension is when the force of blood pumping through your arteries is too strong. If this condition is not controlled, it may put you at risk for serious complications.  Your personal target blood pressure may vary depending on your medical conditions, your age, and other factors. For most people, a normal blood pressure is less than 120/80.  Hypertension is treated with lifestyle changes, medicines, or a combination of both. Lifestyle changes include weight loss, eating a healthy, low-sodium diet, exercising more, and limiting alcohol. This information is not intended to replace advice given to you by your health care provider. Make sure you discuss any questions you have with your health care provider. Document Released: 08/11/2005 Document Revised: 07/09/2016 Document Reviewed: 07/09/2016 [employer]  [employer] Patient Education  2018 [employer].   IF you received an x-ray today, you will receive an invoice from [facility]. Please contact [facility] at [phone] with questions or concerns regarding your invoice.   IF you received labwork today, you will receive an invoice from [facility]. Please contact [facility] at [phone] with questions or concerns regarding your invoice.   Our billing staff will not be able to assist you with questions regarding bills from these companies.  You will be contacted with the lab results as soon as they are available. The fastest way to get your results is to activate your My Chart account. Instructions are located on the last page of this paperwork. If you have not heard from [place] regarding the results in 2 weeks, please contact this office.

## 2017-12-03 ENCOUNTER — Encounter: Payer: Self-pay | Admitting: Hematology

## 2017-12-04 ENCOUNTER — Encounter: Payer: Self-pay | Admitting: Family Medicine

## 2017-12-10 ENCOUNTER — Other Ambulatory Visit: Payer: Self-pay | Admitting: *Deleted

## 2017-12-10 DIAGNOSIS — C50411 Malignant neoplasm of upper-outer quadrant of right female breast: Secondary | ICD-10-CM

## 2017-12-10 DIAGNOSIS — Z170 Estrogen receptor positive status [ER+]: Secondary | ICD-10-CM

## 2017-12-10 MED ORDER — TAMOXIFEN CITRATE 20 MG PO TABS: 20.0000 mg | ORAL_TABLET | Freq: Every day | ORAL | 3 refills | Status: DC

## 2017-12-23 ENCOUNTER — Encounter: Payer: Self-pay | Admitting: Family Medicine

## 2018-01-16 ENCOUNTER — Encounter (HOSPITAL_COMMUNITY): Payer: Self-pay | Admitting: Hematology

## 2018-02-02 ENCOUNTER — Telehealth: Payer: Self-pay | Admitting: Hematology

## 2018-02-02 NOTE — Telephone Encounter (Signed)
Patient called to reschedule  °

## 2018-02-25 ENCOUNTER — Other Ambulatory Visit: Payer: Self-pay | Admitting: Family Medicine

## 2018-02-25 DIAGNOSIS — I10 Essential (primary) hypertension: Secondary | ICD-10-CM

## 2018-03-01 ENCOUNTER — Ambulatory Visit: Payer: Commercial Managed Care - PPO | Admitting: Hematology

## 2018-03-01 ENCOUNTER — Other Ambulatory Visit: Payer: Commercial Managed Care - PPO

## 2018-03-03 NOTE — Progress Notes (Signed)
[place]  [phone] [phone]  Clinic Follow Up Note   Patient Care Team: [name], MD as PCP - General (Family Medicine) Brien Few, MD as Consulting Physician (Obstetrics and Gynecology) Artelia Laroche, CNM as Midwife (Obstetrics and Gynecology) Excell Seltzer, MD as Consulting Physician (General Surgery) [name], MD as Consulting Physician (Hematology) Gery Pray, MD as Consulting Physician (Radiation Oncology)   Date of Service:  03/15/2018  CHIEF COMPLAINT:  Follow up right breast cancer  Oncology History   Breast cancer of upper-outer quadrant of right female breast [facility])   Staging form: Breast, AJCC 7th Edition   - Clinical stage from 04/23/2016: Stage IA (T1c(m), N0, M0) - Signed by [name], MD on 04/30/2016   - Pathologic stage from 05/27/2016: Stage IIA (T2(m), N0, cM0) - Unsigned      Breast cancer of upper-outer quadrant of right female breast ([place])   04/17/2016 Mammogram    Mammogram and US showed a 2.2cm cyst at 12:00, a 1.6cm lobulated mass at 9:00 and a 51m mass at 10:00, axilla was negative       04/23/2016 Initial Diagnosis    Breast cancer of upper-outer quadrant of right female breast [place]      04/23/2016 Initial Biopsy    Core needle biopsy of right breast masses at 10:00 and 9:30 positions both showed invasive and insitu mammary carcinoma       04/23/2016 Receptors her2    ER 90%+, PR 80-90%+, HER2-, KI67 10-15%      05/12/2016 Procedure    Genetic testing: Negative. Genes analyzed: ATM, BAP1, BARD1, BRCA1, BRCA2, BRIP1, CDH1, CDK4, CDKN2A, CHEK2, EPCAM, FANCC, MITF, MLH1, MSH2, MSH6, NBN, PALB2, PMS2, PTEN, RAD51C, RAD51D, TP53, and XRCC2      05/27/2016 Surgery    Right breast mastectomy and sentinel lymph node biopsy ([place])      05/27/2016 Pathology Results    Right breast mastectomy showed multifocal invasive and in situ lobular carcinoma, 3.2, 0.9 and 0.9 cm, G1 margins were  negative, 3 sentinel lymph nodes were negative.       05/27/2016 Oncotype testing    RS 18, which predicts 10 year distant recurrence risk of 12% with tamoxifen (intermediate risk)       11/23/2016 -  Anti-estrogen oral therapy    Tamoxifen 20 mg daily, she started with 11mdaily for the first month         HISTORY OF PRESENTING ILLNESS (04/30/2016):  AbMax Fickle52.o. female is here because of her recently diagnosed right breast cancer. She is accompanied by her friend and sister to our multidisciplinary breast clinic today.   She felt a breast lump about a few weeks ago. She denies any breast pain, skin change or nipple discharge. She had normal screening mammogram in April 2017. She was seen by her primary care physician for the breast mass, and underwent diagnostic mammogram and ultrasound on 04/17/2016, which showed a 2.2 cm cyst at 12:00 position, a 1.6 cm lobulated mass at 9:00 and a 6 mm mass at 10:00 position, ultrasound of the axilla was negative. She underwent ultrasound guided core needle biopsy of both right breast mass, which all showed invasive lobular carcinoma, ER/PR strongly positive, HER-2 negative.  She feels very well overall, denies any pain or other symptoms. She is physically active, has been quite anxious since her cancer diagnosis.  GYN HISTORY  Menarchal: 12 LMP: [age]hysrectomy)  Contraceptive: 10, stopped in 30's  HRT: none  G1P1: one daughter, 4 yo   CURRENT THERAPY: adjuvant Tamoxifen 52m dialy, she started on 11/23/2016 and had 52mdaily for the first month   INTERIM HISTORY:  AbMirelyeturns for follow-up of her right breast cancer. She is here with her boyfriend. She feels good today and is happy about a new job. She is concerned about side effects from Tamoxifen including weight gain, and recurrent UTIs. She states that she feels depressed sometimes.'She had a hysterectomy in 2012 and doesn't have cycles.  She states that she is trying to be  careful with her weight, but sometimes overeats due to stress. She doesn't drink alcohol.   Her BP is high today at 156/101. She states that she is compliant with medications and she is seeing Dr. GrCarlota Raspberrynd will she him soon. No CP or HA.      MEDICAL HISTORY:  Past Medical History:  Diagnosis Date  . Anxiety   . Breast cancer of upper-outer quadrant of right female breast ([place]9/08/2015  . Depression   . Family history of breast cancer   . Family history of prostate cancer   . Hypertension    "because of stress" (05/27/2016)  . PONV (postoperative nausea and vomiting)   . Seasonal allergies     SURGICAL HISTORY: Past Surgical History:  Procedure Laterality Date  . ABDOMINAL HYSTERECTOMY  Aug 2011   [employer] Vinc[employer] TAH for fibroids  . BREAST BIOPSY  03/2016  . FOOT NEUROMA SURGERY Right 2000?  . Marland KitchenASTECTOMY COMPLETE / SIMPLE W/ SENTINEL NODE BIOPSY Right 05/27/2016  . MASTECTOMY W/ SENTINEL NODE BIOPSY Right 05/27/2016   Procedure: RIGHT TOTAL MASTECTOMY WITH AXILLARY SENTINEL LYMPH NODE BIOPSY;  Surgeon: BeExcell SeltzerMD;  [facility]: General;  Laterality: Right;  . TONSILLECTOMY    . WISDOM TOOTH EXTRACTION  ~ 1985    SOCIAL HISTORY: Social History   Socioeconomic History  . Marital status: Legally Separated    Spouse name: Not on file  . Number of children: Not on file  . Years of education: Not on file  . Highest education level: Not on file  Occupational History  . Occupation: LaEducation administratorSocial Needs  . Financial resource strain: Not on file  . Food insecurity:    Worry: Not on file    Inability: Not on file  . Transportation needs:    Medical: Not on file    Non-medical: Not on file  Tobacco Use  . Smoking status: Former Smoker    Packs/day: 0.50    Years: 15.00    Pack years: 7.50    Types: Cigarettes    Last attempt to quit: 06/23/1995    Years since quitting: 22.7  . Smokeless tobacco: Never Used  Substance and Sexual Activity  .  Alcohol use: No    Alcohol/week: 0.0 oz    Comment: 05/27/2016 "nothing since 1996"  . Drug use: No    Comment: 05/27/2016 "nothing since 1996"  . Sexual activity: Yes  Lifestyle  . Physical activity:    Days per week: Not on file    Minutes per session: Not on file  . Stress: Not on file  Relationships  . Social connections:    Talks on phone: Not on file    Gets together: Not on file    Attends religious service: Not on file    Active member of club or organization: Not on file    Attends meetings of clubs or organizations: Not on file  Relationship status: Not on file  . Intimate partner violence:    Fear of current or ex partner: Not on file    Emotionally abused: Not on file    Physically abused: Not on file    Forced sexual activity: Not on file  Other Topics Concern  . Not on file  Social History Narrative   Married. Education: [employer]. Exercise: Gym 2 times a week fit walking.                      FAMILY HISTORY: Family History  Problem Relation Age of Onset  . Hypertension Mother   . Depression Mother   . Hyperlipidemia Mother   . Heart disease Mother   . Prostate cancer Father 71  . Mental illness Sister   . Hypertension Sister   . Prostate cancer Brother        dx in his late 53s  . Mental illness Maternal Grandmother   . Breast cancer Maternal Grandmother        dx in her 78s  . Lung cancer Maternal Grandmother        smoker since age 80; dx in her 17s  . Cancer Maternal Grandfather   . Heart disease Maternal Grandfather   . Mental illness Maternal Grandfather   . Lung cancer Paternal Grandmother        dx in her 61s, life long smoker  . Prostate cancer Paternal Grandfather   . Allergies Daughter   . Anxiety disorder Daughter   . Melanoma Brother 69  . Bladder Cancer Sister 9    ALLERGIES:  has No Known Allergies.  MEDICATIONS:  Current Outpatient Medications  Medication Sig Dispense Refill  . amLODipine (NORVASC) 5 MG tablet TAKE 1  TABLET BY MOUTH  DAILY 90 tablet 0  . amphetamine-dextroamphetamine (ADDERALL) 10 MG tablet TK 1 T PO  QAM  0  . ASHWAGANDHA PO Take 1,000 mg by mouth. Takes 2 tabs nightly.    . cephALEXin (KEFLEX) 500 MG capsule TK ONE C PO BID FOR 3 DAYS PRN OR 1 C PO AFTER INTERCOURSE  11  . cholecalciferol (VITAMIN D) 1000 UNITS tablet Take 6,000 Units by mouth daily.     Marland Kitchen ibuprofen (ADVIL,MOTRIN) 200 MG tablet Take 200 mg by mouth every 6 (six) hours as needed for mild pain.    . Lactobacillus Rhamnosus, GG, (CULTURELLE PO) Take by mouth.    . loratadine (CLARITIN) 10 MG tablet Take 10 mg by mouth daily.    . magnesium 30 MG tablet Take 500 mg by mouth daily.     . Multiple Vitamins-Minerals (MULTIVITAMIN WITH MINERALS) tablet Take 1 tablet by mouth daily.    . Omega-3 Fatty Acids (FISH OIL PO) Take 1 capsule by mouth daily.     . tamoxifen (NOLVADEX) 20 MG tablet Take 1 tablet (20 mg total) by mouth daily. 90 tablet 3  . valACYclovir (VALTREX) 500 MG tablet TK 1 T PO D  4  . venlafaxine (EFFEXOR) 75 MG tablet Take 75 mg by mouth daily.     No current facility-administered medications for this visit.     REVIEW OF SYSTEMS:   Constitutional: Denies fevers, chills or abnormal night sweats (+) weight gain Eyes: Denies blurriness of vision, double vision or watery eyes Ears, nose, mouth, throat, and face: Denies mucositis or sore throat Respiratory: Denies cough, dyspnea or wheezes Cardiovascular: Denies palpitation, chest discomfort or lower extremity swelling Gastrointestinal:  Denies nausea, heartburn or change  in bowel habits GU: (+) Recurrent UTI Skin: Denies abnormal skin rashes  Lymphatics: Denies new lymphadenopathy or easy bruising Neurological:Denies numbness, tingling or new weaknesses Behavioral/Psych: Mood is stable, no new changes (+) occasional depression  All other systems were reviewed with the patient and are negative.  PHYSICAL EXAMINATION:  ECOG PERFORMANCE STATUS: 0 -  Asymptomatic  Vitals:   03/15/18 0949 03/15/18 1023  BP: (!) 156/101 (!) 151/102  Pulse: 96   Resp: 18   Temp: 98.6 F (37 C)   SpO2: 97%    Filed Weights   03/15/18 0949  Weight: 194 lb 3.2 oz (88.1 kg)    GENERAL:alert, no distress and comfortable SKIN: skin color, texture, turgor are normal, no rashes or significant lesions EYES: normal, conjunctiva are pink and non-injected, sclera clear OROPHARYNX:no exudate, no erythema and lips, buccal mucosa, and tongue normal  NECK: supple, thyroid normal size, non-tender, without nodularity LYMPH:  no palpable lymphadenopathy in the cervical, axillary or inguinal LUNGS: clear to auscultation and percussion with normal breathing effort HEART: regular rate & rhythm and no murmurs and no lower extremity edema ABDOMEN:abdomen soft, non-tender and normal bowel sounds Musculoskeletal:no cyanosis of digits and no clubbing  PSYCH: alert & oriented x 3 with fluent speech NEURO: no focal motor/sensory deficits Breasts: (+) Right breast is surgically absent, the incision is well healed, no palpable abnormalities, discharge or skin discoloration. Palpation of the left beast and bilateral axillas revealed no obvious mass that I could appreciate.   LABORATORY DATA:  I have reviewed the data as listed CBC Latest Ref Rng & Units 03/15/2018 10/26/2017 04/16/2017  WBC 3.9 - 10.3 K/uL 6.3 7.8 7.0  Hemoglobin 11.6 - 15.9 g/dL 13.9 13.9 13.1  Hematocrit 34.8 - 46.6 % 40.0 40.1 38.3  Platelets 145 - 400 K/uL 308 334 297   CMP Latest Ref Rng & Units 03/15/2018 10/26/2017 04/16/2017  Glucose 70 - 99 mg/dL 102(H) 90 95  BUN 6 - 20 mg/dL <2(L) 11 15.9  Creatinine 0.44 - 1.00 mg/dL 0.87 0.83 0.8  Sodium 135 - 145 mmol/L 139 138 138  Potassium 3.5 - 5.1 mmol/L 4.0 3.9 4.5  Chloride 98 - 111 mmol/L 107 105 -  CO2 22 - 32 mmol/L 23 21(L) 24  Calcium 8.9 - 10.3 mg/dL 9.0 9.2 8.9  Total Protein 6.5 - 8.1 g/dL 7.2 7.4 7.1  Total Bilirubin 0.3 - 1.2 mg/dL 0.5 0.5  0.60  Alkaline Phos 38 - 126 U/L 47 49 38(L)  AST 15 - 41 U/L '21 21 17  ' ALT 0 - 44 U/L '17 25 14   ' PATHOLOGY REPORT  Diagnosis 04/23/2016 1. Breast, right, needle core biopsy, 10 o'clock - INVASIVE AND IN SITU MAMMARY CARCINOMA. 2. Breast, right, needle core biopsy, 9:30 o'clock - INVASIVE AND IN SITU MAMMARY CARCINOMA.  Microscopic Comment 1. , 2. E-Cadherin and breast prognostic profile will be performed.  1. Results: IMMUNOHISTOCHEMICAL AND MORPHOMETRIC ANALYSIS PERFORMED MANUALLY Estrogen Receptor: 90%, POSITIVE, STRONG STAINING INTENSITY Progesterone Receptor: 80%, POSITIVE, STRONG STAINING INTENSITY Proliferation Marker Ki67: 10%  Results: HER2 - NEGATIVE RATIO OF HER2/CEP17 SIGNALS 1.60 AVERAGE HER2 COPY NUMBER PER CELL 2.00  2. PROGNOSTIC INDICATORS Results: IMMUNOHISTOCHEMICAL AND MORPHOMETRIC ANALYSIS PERFORMED MANUALLY Estrogen Receptor: 90%, POSITIVE, STRONG STAINING INTENSITY Progesterone Receptor: 90%, POSITIVE, STRONG STAINING INTENSITY Proliferation Marker Ki67: 15%  Results: HER2 - NEGATIVE RATIO OF HER2/CEP17 SIGNALS 1.36 AVERAGE HER2 COPY NUMBER PER CELL 1.70  Diagnosis 05/27/2016 1. Breast, simple mastectomy, Right - MULTIFOCAL INVASIVE AND IN SITU LOBULAR  CARCINOMA, 3.2, 0.9, AND 0.9 CM. - MULTIFOCAL LOBULAR CARCINOMA IN SITU. - MARGINS NOT INVOLVED. - FIBROCYSTIC CHANGES WITH PSEUDOANGIOMATOUS STROMAL HYPERPLASIA. - PREVIOUS BIOPSY SITES. 2. Lymph node, sentinel, biopsy, Right axillary #1 - ONE BENIGN LYMPH NODE (0/1). 3. Lymph node, sentinel, biopsy, Right axillary #2 - ONE BENIGN LYMPH NODE (0/1). 4. Lymph node, sentinel, biopsy, Right axillary #3 - ONE BENIGN LYMPH NODE (0/1). Microscopic Comment 1. BREAST, INVASIVE TUMOR, WITH LYMPH NODES PRESENT Specimen, including laterality and lymph node sampling (sentinel, non-sentinel): Right breast and three sentinel lymph nodes. Procedure: Mastectomy and sentinel lymph node biopsies. Histologic  type: Lobular. Grade: 1 Tubule formation: 3 Nuclear pleomorphism: 1 Mitotic:1 Tumor size (gross measurement): 3.2, 0.9, and 0.9 cm Margins: Free of tumor. Invasive, distance to closest margin: 0.9 cm from anterior margin. In-situ, distance to closest margin: 0.9 cm from anterior margin. If margin positive, focally or broadly: N/A Lymphovascular invasion: Not identified. Ductal carcinoma in situ: No. Grade: N/A 1 of 4 FINAL for Vanorder, Talisa C ([id number]) Microscopic Comment(continued) Extensive intraductal component: N/A Lobular neoplasia: Yes, lobular carcinoma in situ. Tumor focality: Multifocal. Extent of tumor: Skin: Free of tumor Nipple: Free of tumor. Skeletal muscle: Free of tumor. Lymph nodes: Examined: 3 Sentinel 0 Non-sentinel 3 Total Lymph nodes with metastasis: 0 Isolated tumor cells (< 0.2 mm): 0 Micrometastasis: (> 0.2 mm and < 2.0 mm): 0 Macrometastasis: (> 2.0 mm): 0 Extracapsular extension: N/A Breast prognostic profile: Case [id number]. Estrogen receptor: 90%, positive, strong staining. Progesterone receptor: 90%, positive, strong staining. Her 2 neu: Negative, ratio 1.36 and 1.6. Ki-67: 10% and 15%. Non-neoplastic breast: Fibrocystic changes and pseudoangiomatous stromal hyperplasia. TNM: pT2(multifocal), pN0, pMX Comments: There are three nodules of invasive and in situ lobular carcinoma which are 3.2, 0.9, and 0.9 cm. There are also separate foci of lobular carcinoma in situ. The margins of the specimen are not involved by invasive or in situ lobular carcinoma. Immunohistochemistry for cytokeratin AE1/AE3 is performed on all of the sentinel lymph nodes and no positivity is identified. (JDP:gt, 05/29/16)  Oncotype Dx RS 18, which predicts 10 year distant recurrence risk of 12% with tamoxifen  RADIOGRAPHIC STUDIES: I have personally reviewed the radiological images as listed and agreed with the findings in the report. No results found.   Breast  MRI b/l in 07/29/17  IMPRESSION: 1. No MR evidence of breast malignancy. 2. Status post right mastectomy. RECOMMENDATION: Left screening mammogram in 1 year.  ASSESSMENT & PLAN:  52 y.o. Caucasian female, no significant past medical history, presented with a palpable right breast mass  1. Breast cancer of upper outer quadrant of right breast, invasive lobular carcinoma, mpT2N0M0, stage IIA, ER+/PR+/HER2-, G1, RS 18  --I previously discussed her surgical path result in details. -She had multifocal disease, surgical margins were negative. Nodes were negative. -the Oncotype Dx result was previously with her in details. She has intermedia risk based on the recurrence score, which predicts 10 year distant recurrence after 5 years of tamoxifen 12%. The benefit of chemotherapy in the intermedia risk group is small and controversial. Given her relatively low score in the intermedia group, I did not recommend adjuvant chemotherapy. She agrees with the plan -Giving the strong ER and PR expression in her tumor, I recommend adjuvant endocrine therapy Tamoxifen  -Her FSH and estradiol level showed she is still premenopausal -She has finally started adjuvant tamoxifen, tolerated full dose 70m daily well.  -She has recently gained some weight, and has had a recurrent UTI, worsening hypertension, her  fasting blood glucose is also slightly high, potentially related to tamoxifen.  I encouraged her to watch her diet, and try to loose weight.  She will follow-up with her primary care physician for her attention and checking her HbA1c.  -I plan to see her back in 3 months, will check her [place] level, to see if she is truly postmenopausal, and we can switch tamoxifen to aromatase inhibitor. -She is not interested in breast reconstruction surgery at this time..  -Continue annual screening mammogram of left breast, next in 04/2018 -Continue breast cancer surveillance. I will see her in 3 months.  2. Bone health -We  previously discussed the effect on bone density from tamoxifen or aromatase inhibitor -I encouraged her to take calcium and vitamin D  3. Hypertension, depression  -She has stopped Wellbutrin, and has stopped Zoloft -Continue follow-up with her primary care physician  -She is currently on low dose amlodpaine. Given her elevated BP at home I suggest she see Dr. Carlota Raspberry about increasing her Amlodpaine.  -She has started taking Effexor and is feeling well. If her mood and hot flashes increase with Tamoxifen she is fine to increase her Effexor. I recommend she also gets counseling.  -Her BP is high today at 156/101 and her BG is high at 102. I educated her about possibility of Tamoxifen being a contributing factor. -She will see Dr. Carlota Raspberry soon.  4. Recurrent UTI  -she is on prophylactic Keflex, and to sees urologist Dr. Tresa Moore   Plan -Continue Tamoxifen 20 mg daily -F/u and labs 3 months for close monitoring her AEs, lab a few days before her visit including estradiol and [place]   Orders Placed This Encounter  Procedures  . Estradiol    Standing Status:   Future    Standing Expiration Date:   03/15/2019  . FSH-Follicle stimulating hormone    Standing Status:   Future    Standing Expiration Date:   03/15/2019    All questions were answered. The patient knows to call the clinic with any problems, questions or concerns. I spent 20 minutes counseling the patient face to face. The total time spent in the appointment was 25 minutes and more than 50% was on counseling.  Dierdre Searles Dweik am acting as scribe for Dr. [name].  I have reviewed the above documentation for accuracy and completeness, and I agree with the above.     [name], MD 03/15/2018

## 2018-03-15 ENCOUNTER — Telehealth: Payer: Self-pay

## 2018-03-15 ENCOUNTER — Inpatient Hospital Stay (HOSPITAL_BASED_OUTPATIENT_CLINIC_OR_DEPARTMENT_OTHER): Payer: Commercial Managed Care - PPO | Admitting: Hematology

## 2018-03-15 ENCOUNTER — Encounter: Payer: Self-pay | Admitting: Hematology

## 2018-03-15 ENCOUNTER — Inpatient Hospital Stay: Payer: Commercial Managed Care - PPO | Attending: Hematology

## 2018-03-15 VITALS — BP 151/102 | HR 96 | Temp 98.6°F | Resp 18 | Ht 69.0 in | Wt 194.2 lb

## 2018-03-15 DIAGNOSIS — C50412 Malignant neoplasm of upper-outer quadrant of left female breast: Secondary | ICD-10-CM | POA: Insufficient documentation

## 2018-03-15 DIAGNOSIS — Z170 Estrogen receptor positive status [ER+]: Secondary | ICD-10-CM | POA: Diagnosis not present

## 2018-03-15 DIAGNOSIS — I10 Essential (primary) hypertension: Secondary | ICD-10-CM | POA: Insufficient documentation

## 2018-03-15 DIAGNOSIS — Z87891 Personal history of nicotine dependence: Secondary | ICD-10-CM

## 2018-03-15 DIAGNOSIS — F32A Depression, unspecified: Secondary | ICD-10-CM

## 2018-03-15 DIAGNOSIS — C50411 Malignant neoplasm of upper-outer quadrant of right female breast: Secondary | ICD-10-CM

## 2018-03-15 DIAGNOSIS — F329 Major depressive disorder, single episode, unspecified: Secondary | ICD-10-CM

## 2018-03-15 DIAGNOSIS — N390 Urinary tract infection, site not specified: Secondary | ICD-10-CM | POA: Insufficient documentation

## 2018-03-15 LAB — CBC WITH DIFFERENTIAL/PLATELET
BASOS ABS: 0 10*3/uL (ref 0.0–0.1)
BASOS PCT: 0 %
EOS PCT: 3 %
Eosinophils Absolute: 0.2 10*3/uL (ref 0.0–0.5)
HCT: 40 % (ref 34.8–46.6)
Hemoglobin: 13.9 g/dL (ref 11.6–15.9)
Lymphocytes Relative: 27 %
Lymphs Abs: 1.7 10*3/uL (ref 0.9–3.3)
MCH: 33.7 pg (ref 25.1–34.0)
MCHC: 34.8 g/dL (ref 31.5–36.0)
MCV: 97 fL (ref 79.5–101.0)
MONO ABS: 0.4 10*3/uL (ref 0.1–0.9)
Monocytes Relative: 7 %
NEUTROS PCT: 63 %
Neutro Abs: 4 10*3/uL (ref 1.5–6.5)
PLATELETS: 308 10*3/uL (ref 145–400)
RBC: 4.12 MIL/uL (ref 3.70–5.45)
RDW: 12.7 % (ref 11.2–14.5)
WBC: 6.3 10*3/uL (ref 3.9–10.3)

## 2018-03-15 LAB — COMPREHENSIVE METABOLIC PANEL
ALBUMIN: 4.3 g/dL (ref 3.5–5.0)
ALT: 17 U/L (ref 0–44)
AST: 21 U/L (ref 15–41)
Alkaline Phosphatase: 47 U/L (ref 38–126)
Anion gap: 9 (ref 5–15)
CHLORIDE: 107 mmol/L (ref 98–111)
CO2: 23 mmol/L (ref 22–32)
Calcium: 9 mg/dL (ref 8.9–10.3)
Creatinine, Ser: 0.87 mg/dL (ref 0.44–1.00)
GFR calc Af Amer: >60 mL/min (ref >60)
GFR calc non Af Amer: >60 mL/min (ref >60)
GLUCOSE: 102 mg/dL — AB (ref 70–99)
Potassium: 4 mmol/L (ref 3.5–5.1)
Sodium: 139 mmol/L (ref 135–145)
Total Bilirubin: 0.5 mg/dL (ref 0.3–1.2)
Total Protein: 7.2 g/dL (ref 6.5–8.1)

## 2018-03-15 NOTE — Telephone Encounter (Signed)
Patient declined avs and calender due to MyChart. Per 7/22 los

## 2018-03-23 ENCOUNTER — Encounter: Payer: Self-pay | Admitting: Family Medicine

## 2018-03-23 ENCOUNTER — Ambulatory Visit: Payer: Commercial Managed Care - PPO | Admitting: Family Medicine

## 2018-03-23 ENCOUNTER — Other Ambulatory Visit: Payer: Self-pay

## 2018-03-23 VITALS — BP 138/90 | HR 98 | Temp 98.6°F | Ht 69.0 in | Wt 194.4 lb

## 2018-03-23 DIAGNOSIS — R739 Hyperglycemia, unspecified: Secondary | ICD-10-CM

## 2018-03-23 DIAGNOSIS — I10 Essential (primary) hypertension: Secondary | ICD-10-CM

## 2018-03-23 NOTE — Patient Instructions (Addendum)
   Ok to continue 7.5mg  amlodipine daily, but if blood pressure remains above 140/90 this week, then increase to 10mg  per day. Let me know what dose works so I can send in the appropriate refill.   Recheck in 6 weeks and I can check other blood sugar test at that time.   Return to the clinic or go to the nearest emergency room if any of your symptoms worsen or new symptoms occur.    IF you received an x-ray today, you will receive an invoice from [facility]. Please contact [facility] at [phone] with questions or concerns regarding your invoice.   IF you received labwork today, you will receive an invoice from [place]. Please contact [facility] at [phone] with questions or concerns regarding your invoice.   Our billing staff will not be able to assist you with questions regarding bills from these companies.  You will be contacted with the lab results as soon as they are available. The fastest way to get your results is to activate your My Chart account. Instructions are located on the last page of this paperwork. If you have not heard from [place] regarding the results in 2 weeks, please contact this office.

## 2018-03-23 NOTE — Progress Notes (Signed)
Subjective:  By signing my name below, I, Delton Coombes, attest that this documentation has been prepared under the direction and in the presence of [name], MD Electronically Signed: Delton Coombes Medical Scribe 03/23/2018 at 5:20 PM   Patient ID: Caitlin Monroe, female    DOB: 09-18-1965, 52 y.o.   MRN: [id number] Chief Complaint  Patient presents with  . Blood Pressure Check    HPI Caitlin Monroe is a 52 y.o. female who presents to Primary Care at [facility] here for follow up of hypertension last seen April 4th, last time was 157/89. Home reading had been reading higher prior, had self increase amlodipine to 5 mg in March.. She had ran out of medication two weeks prior to last visit. She had been more controlled on the 5 mg, as well as lavender over the encounter, with readings of 110s to 120's over 80's. Last seen eight days ago by her oncologist, being followed for breast cancer diagnose since 2017.  She is taking tamoxifen and Effexor for mood. It was noted on at visit with Dr. Burr Medico that tamoxifen may affect blood pressure. Patient is expressed not enjoying new work schedule, including commute to [place], [place] and being very stressed. Patient reports that she is worried about hypertension, because she "does not feel it". She reports that she is not using Lavender anymore because she reported that affects her estrogen levels. She self increased amlodipine to 7.5mg . She reports that she checks blood pressure at night and having numbers 120's to 150's. She denies having any issues with the amlodipine.   Hypertension BP Readings from Last 3 Encounters:  03/23/18 138/90  03/15/18 (!) 151/102  11/26/17 (!) 157/99   Lab Results  Component Value Date   CREATININE 0.87 03/15/2018   Wt Readings from Last 3 Encounters:  03/23/18 194 lb 6.4 oz (88.2 kg)  03/15/18 194 lb 3.2 oz (88.1 kg)  11/26/17 189 lb 3.2 oz (85.8 kg)     Patient Active Problem List   Diagnosis Date Noted    . Breast cancer, right ([place]) 05/27/2016  . Genetic testing 05/13/2016  . Invasive carcinoma of breast ([place]) 05/06/2016  . Family history of breast cancer   . Family history of prostate cancer   . Breast cancer of upper-outer quadrant of right female breast ([place]) 04/25/2016  . Depression 06/27/2015  . Seasonal affective disorder ([place]) 06/27/2015  . Allergic rhinitis 04/05/2013  . Depression with anxiety 04/05/2013   Past Medical History:  Diagnosis Date  . Anxiety   . Breast cancer of upper-outer quadrant of right female breast ([place]) 04/25/2016  . Depression   . Family history of breast cancer   . Family history of prostate cancer   . Hypertension    "because of stress" (05/27/2016)  . PONV (postoperative nausea and vomiting)   . Seasonal allergies    Past Surgical History:  Procedure Laterality Date  . ABDOMINAL HYSTERECTOMY  Aug 2011   Da Vinci TAH for fibroids  . BREAST BIOPSY  03/2016  . FOOT NEUROMA SURGERY Right 2000?  Marland Kitchen MASTECTOMY COMPLETE / SIMPLE W/ SENTINEL NODE BIOPSY Right 05/27/2016  . MASTECTOMY W/ SENTINEL NODE BIOPSY Right 05/27/2016   Procedure: RIGHT TOTAL MASTECTOMY WITH AXILLARY SENTINEL LYMPH NODE BIOPSY;  Surgeon: Excell Seltzer, MD;  Location: [facility];  Service: General;  Laterality: Right;  . TONSILLECTOMY    . WISDOM TOOTH EXTRACTION  ~ 1985   No Known Allergies Prior to Admission medications  Medication Sig Start Date End Date Taking? Authorizing Provider  amLODipine (NORVASC) 5 MG tablet TAKE 1 TABLET BY MOUTH  DAILY 02/26/18   [name], MD  amphetamine-dextroamphetamine (ADDERALL) 10 MG tablet TK 1 T PO  QAM 06/25/17   [provider]  ASHWAGANDHA PO Take 1,000 mg by mouth. Takes 2 tabs nightly.    [provider]  cephALEXin (KEFLEX) 500 MG capsule TK ONE C PO BID FOR 3 DAYS PRN OR 1 C PO AFTER INTERCOURSE 11/07/17   [provider]  cholecalciferol (VITAMIN D) 1000 UNITS tablet Take 6,000 Units by mouth daily.      [provider]  ibuprofen (ADVIL,MOTRIN) 200 MG tablet Take 200 mg by mouth every 6 (six) hours as needed for mild pain.    [provider]  Lactobacillus Rhamnosus, GG, (CULTURELLE PO) Take by mouth.    [provider]  loratadine (CLARITIN) 10 MG tablet Take 10 mg by mouth daily.    [provider]  magnesium 30 MG tablet Take 500 mg by mouth daily.     [provider]  Multiple Vitamins-Minerals (MULTIVITAMIN WITH MINERALS) tablet Take 1 tablet by mouth daily.    [provider]  Omega-3 Fatty Acids (FISH OIL PO) Take 1 capsule by mouth daily.     [provider]  tamoxifen (NOLVADEX) 20 MG tablet Take 1 tablet (20 mg total) by mouth daily. 12/10/17   [name], MD  valACYclovir (VALTREX) 500 MG tablet TK 1 T PO D 08/30/17   [provider]  venlafaxine (EFFEXOR) 75 MG tablet Take 75 mg by mouth daily.    [provider]   Social History   Socioeconomic History  . Marital status: Legally Separated    Spouse name: Not on file  . Number of children: Not on file  . Years of education: Not on file  . Highest education level: Not on file  Occupational History  . Occupation: Education administrator  Social Needs  . Financial resource strain: Not on file  . Food insecurity:    Worry: Not on file    Inability: Not on file  . Transportation needs:    Medical: Not on file    Non-medical: Not on file  Tobacco Use  . Smoking status: Former Smoker    Packs/day: 0.50    Years: 15.00    Pack years: 7.50    Types: Cigarettes    Last attempt to quit: 06/23/1995    Years since quitting: 22.7  . Smokeless tobacco: Never Used  Substance and Sexual Activity  . Alcohol use: No    Alcohol/week: 0.0 oz    Comment: 05/27/2016 "nothing since 1996"  . Drug use: No    Comment: 05/27/2016 "nothing since 1996"  . Sexual activity: Yes  Lifestyle  . Physical activity:    Days per week: Not on file    Minutes per session: Not on file    . Stress: Not on file  Relationships  . Social connections:    Talks on phone: Not on file    Gets together: Not on file    Attends religious service: Not on file    Active member of club or organization: Not on file    Attends meetings of clubs or organizations: Not on file    Relationship status: Not on file  . Intimate partner violence:    Fear of current or ex partner: Not on file    Emotionally abused: Not on file  Physically abused: Not on file    Forced sexual activity: Not on file  Other Topics Concern  . Not on file  Social History Narrative   Married. Education: [employer]. Exercise: Gym 2 times a week fit walking.                     Review of Systems  Constitutional: Negative for fatigue and unexpected weight change.  Respiratory: Negative for chest tightness and shortness of breath.   Cardiovascular: Negative for chest pain, palpitations and leg swelling.  Gastrointestinal: Negative for abdominal pain and blood in stool.  Neurological: Negative for dizziness, syncope, light-headedness and headaches.       Objective:   Physical Exam  Constitutional: She is oriented to person, place, and time. She appears well-developed and well-nourished.  HENT:  Head: Normocephalic and atraumatic.  Eyes: Pupils are equal, round, and reactive to light. Conjunctivae and EOM are normal.  Neck: Normal range of motion. Neck supple. Carotid bruit is not present.  Cardiovascular: Normal rate, regular rhythm, normal heart sounds and intact distal pulses.  Pulmonary/Chest: Effort normal and breath sounds normal.  Abdominal: Soft. Bowel sounds are normal. She exhibits no pulsatile midline mass. There is no tenderness.  Musculoskeletal: Normal range of motion.  Neurological: She is alert and oriented to person, place, and time.  Skin: Skin is warm and dry.  Psychiatric: She has a normal mood and affect. Her behavior is normal. Judgment and thought content normal.  Vitals reviewed.    Vitals:   03/23/18 1654  BP: (!) 148/98  Pulse: 98  Temp: 98.6 F (37 C)  TempSrc: Oral  SpO2: 98%  Weight: 194 lb 6.4 oz (88.2 kg)  Height: 5\' 9"  (1.753 m)       Assessment & Plan:  Caitlin Monroe is a 52 y.o. female Essential hypertension  -Some improvement on 7.5 mg Norvasc, but may need to 10 mg daily.  Monitor home readings and if remaining over 140/90, increase to 10 mg daily, potential side effects including peripheral edema discussed.  Advised to let me know of medication dos,  and I can send  appropriate refills.  Follow-up in 6 weeks with blood work at that time.  Hyperglycemia  -Borderline/minimal hyperglycemia on prior labs noted.  Can recheck at next visit with possible A1c if needed  No orders of the defined types were placed in this encounter.  Patient Instructions     Ok to continue 7.5mg  amlodipine daily, but if blood pressure remains above 140/90 this week, then increase to 10mg  per day. Let me know what dose works so I can send in the appropriate refill.   Recheck in 6 weeks and I can check other blood sugar test at that time.   Return to the clinic or go to the nearest emergency room if any of your symptoms worsen or new symptoms occur.    IF you received an x-ray today, you will receive an invoice from [facility]. Please contact [facility] at [phone] with questions or concerns regarding your invoice.   IF you received labwork today, you will receive an invoice from [facility]. Please contact [facility] at [phone] with questions or concerns regarding your invoice.   Our billing staff will not be able to assist you with questions regarding bills from these companies.  You will be contacted with the lab results as soon as they are available. The fastest way to get your results is to activate your My Chart account. Instructions  are located on the last page of this paperwork. If you have not heard from [place] regarding the results in  2 weeks, please contact this office.       I personally performed the services described in this documentation, which was scribed in my presence. The recorded information has been reviewed and considered for accuracy and completeness, addended by me as needed, and agree with information above.  Signed,   [name], MD Primary Care at [place].  03/26/18 1:03 PM

## 2018-03-26 ENCOUNTER — Encounter: Payer: Self-pay | Admitting: Family Medicine

## 2018-04-05 ENCOUNTER — Encounter: Payer: Self-pay | Admitting: Family Medicine

## 2018-04-06 ENCOUNTER — Other Ambulatory Visit: Payer: Self-pay | Admitting: Family Medicine

## 2018-04-06 ENCOUNTER — Other Ambulatory Visit: Payer: Self-pay

## 2018-04-06 DIAGNOSIS — I10 Essential (primary) hypertension: Secondary | ICD-10-CM

## 2018-04-06 MED ORDER — AMLODIPINE BESYLATE 10 MG PO TABS: 10.0000 mg | ORAL_TABLET | Freq: Every day | ORAL | 0 refills | Status: DC

## 2018-04-12 ENCOUNTER — Encounter: Payer: Self-pay | Admitting: Family Medicine

## 2018-04-21 ENCOUNTER — Telehealth: Payer: Self-pay

## 2018-04-21 NOTE — Telephone Encounter (Signed)
Faxed signed order for diagnostic mammogram to [facility].  

## 2018-04-29 ENCOUNTER — Encounter: Payer: Self-pay | Admitting: Hematology

## 2018-05-04 ENCOUNTER — Ambulatory Visit: Payer: Commercial Managed Care - PPO | Admitting: Family Medicine

## 2018-05-07 ENCOUNTER — Other Ambulatory Visit: Payer: Self-pay

## 2018-05-07 ENCOUNTER — Encounter: Payer: Self-pay | Admitting: Family Medicine

## 2018-05-07 ENCOUNTER — Ambulatory Visit (INDEPENDENT_AMBULATORY_CARE_PROVIDER_SITE_OTHER): Payer: Commercial Managed Care - PPO | Admitting: Family Medicine

## 2018-05-07 VITALS — BP 145/92 | HR 89 | Temp 98.3°F | Ht 68.5 in | Wt 192.6 lb

## 2018-05-07 DIAGNOSIS — I10 Essential (primary) hypertension: Secondary | ICD-10-CM | POA: Diagnosis not present

## 2018-05-07 DIAGNOSIS — R739 Hyperglycemia, unspecified: Secondary | ICD-10-CM | POA: Diagnosis not present

## 2018-05-07 NOTE — Patient Instructions (Addendum)
  Blood pressure is improved. With work changes, less coffee and ADD med changes, we can see if BP continues to improve in next few weeks. If you still obtain higher readings than 140/90, let me know and we can add low dose diuretic.   I will check the blood sugar test and let you know the results.  Please follow-up with me in 3 months, let me know if there are questions prior to that time.   Thanks for coming in today.   If you have lab work done today you will be contacted with your lab results within the next 2 weeks.  If you have not heard from [place] then please contact us. The fastest way to get your results is to register for My Chart.   IF you received an x-ray today, you will receive an invoice from [facility]. Please contact [facility] at [phone] with questions or concerns regarding your invoice.   IF you received labwork today, you will receive an invoice from [facility]. Please contact [facility] at [phone] with questions or concerns regarding your invoice.   Our billing staff will not be able to assist you with questions regarding bills from these companies.  You will be contacted with the lab results as soon as they are available. The fastest way to get your results is to activate your My Chart account. Instructions are located on the last page of this paperwork. If you have not heard from [place] regarding the results in 2 weeks, please contact this office.

## 2018-05-07 NOTE — Progress Notes (Signed)
Subjective:  By signing my name below, I, [name], attest that this documentation has been prepared under the direction and in the presence of Caitlin Ray, MD. Electronically Signed: [name], [place]. 05/07/2018 , 5:48 PM .  Patient was seen in Room 1 .   Patient ID: Caitlin Monroe, female    DOB: Feb 21, 1966, 52 y.o.   MRN: [id number] Chief Complaint  Patient presents with  . Follow-up    bp chk and blood sugar. Taking half of the Aderrall in hopes to lower bp   HPI Caitlin Monroe is a 52 y.o. female  Patient is here for follow up of HTN and hyperglycemia.   HTN BP Readings from Last 3 Encounters:  05/07/18 (!) 145/92  03/23/18 138/90  03/15/18 (!) 151/102   Lab Results  Component Value Date   CREATININE 0.87 03/15/2018   See last visit regarding change in amlodipine dose. Patient states she's done a few things in attempt to improve her BP. She's been doing well on the amlodipine 10 mg. She's reduced her caffeine intake, eating keto and anti-inflammatory diet, taking CBD oil, halved her Adderall, as well as quitting her old job. She hasn't started her new job yet, but has felt less stressed since quitting.   Her home BP readings have been running 114/82, 122/86, 125/93, 116/84, 122/94, and 120/89. She denies any chest pain, shortness of breath, lightheadedness, dizziness, or headache. She denies any swelling in her lower extremities.   Hyperglycemia On her CMP 1 month prior, her glucose was 102. Her oncologist, Caitlin Monroe, thought it might be caused by tamoxifen.   Lab Results  Component Value Date   GLUCOSE 102 (H) 03/15/2018   GLUCOSE 90 10/26/2017   GLUCOSE 95 04/16/2017    Patient Active Problem List   Diagnosis Date Noted  . Breast cancer, right (Caitlin Monroe) 05/27/2016  . Genetic testing 05/13/2016  . Invasive carcinoma of breast (Caitlin Monroe) 05/06/2016  . Family history of breast cancer   . Family history of prostate cancer   . Breast cancer of upper-outer  quadrant of right female breast (Caitlin Monroe) 04/25/2016  . Depression 06/27/2015  . Seasonal affective disorder ([place]) 06/27/2015  . Allergic rhinitis 04/05/2013  . Depression with anxiety 04/05/2013   Past Medical History:  Diagnosis Date  . Anxiety   . Breast cancer of upper-outer quadrant of right female breast (Caitlin Monroe) 04/25/2016  . Depression   . Family history of breast cancer   . Family history of prostate cancer   . Hypertension    "because of stress" (05/27/2016)  . PONV (postoperative nausea and vomiting)   . Seasonal allergies    Past Surgical History:  Procedure Laterality Date  . ABDOMINAL HYSTERECTOMY  Aug 2011   Da Vinci TAH for fibroids  . BREAST BIOPSY  03/2016  . FOOT NEUROMA SURGERY Right 2000?  Marland Kitchen MASTECTOMY COMPLETE / SIMPLE W/ SENTINEL NODE BIOPSY Right 05/27/2016  . MASTECTOMY W/ SENTINEL NODE BIOPSY Right 05/27/2016   Procedure: RIGHT TOTAL MASTECTOMY WITH AXILLARY SENTINEL LYMPH NODE BIOPSY;  Surgeon: Caitlin Seltzer, MD;  Location: [facility];  Service: General;  Laterality: Right;  . TONSILLECTOMY    . WISDOM TOOTH EXTRACTION  ~ 1985   No Known Allergies Prior to Admission medications   Medication Sig Start Date End Date Taking? Authorizing Provider  amLODipine (NORVASC) 10 MG tablet Take 1 tablet (10 mg total) by mouth daily. 04/06/18   [name], MD  amphetamine-dextroamphetamine (ADDERALL) 10 MG tablet TK 15mg   PO  QAM 06/25/17   [provider]  ASHWAGANDHA PO Take 1,000 mg by mouth. Takes 2 tabs nightly.    [provider]  cephALEXin (KEFLEX) 500 MG capsule TK ONE C PO BID FOR 3 DAYS PRN OR 1 C PO AFTER INTERCOURSE 11/07/17   [provider]  cholecalciferol (VITAMIN D) 1000 UNITS tablet Take 6,000 Units by mouth daily.     [provider]  ibuprofen (ADVIL,MOTRIN) 200 MG tablet Take 200 mg by mouth every 6 (six) hours as needed for mild pain.    [provider]  Lactobacillus Rhamnosus, GG, (CULTURELLE PO) Take by  mouth.    [provider]  loratadine (CLARITIN) 10 MG tablet Take 10 mg by mouth daily.    [provider]  magnesium 30 MG tablet Take 500 mg by mouth daily.     [provider]  Multiple Vitamins-Minerals (MULTIVITAMIN WITH MINERALS) tablet Take 1 tablet by mouth daily.    [provider]  Omega-3 Fatty Acids (FISH OIL PO) Take 1 capsule by mouth daily.     [provider]  tamoxifen (NOLVADEX) 20 MG tablet Take 1 tablet (20 mg total) by mouth daily. 12/10/17   Monroe Merle, MD  valACYclovir (VALTREX) 500 MG tablet TK 1 T PO D 08/30/17   [provider]  venlafaxine (EFFEXOR) 75 MG tablet Take 75 mg by mouth daily.    [provider]   Social History   Socioeconomic History  . Marital status: Legally Separated    Spouse name: Not on file  . Number of children: Not on file  . Years of education: Not on file  . Highest education level: Not on file  Occupational History  . Occupation: Education administrator  Social Needs  . Financial resource strain: Not on file  . Food insecurity:    Worry: Not on file    Inability: Not on file  . Transportation needs:    Medical: Not on file    Non-medical: Not on file  Tobacco Use  . Smoking status: Former Smoker    Packs/day: 0.50    Years: 15.00    Pack years: 7.50    Types: Cigarettes    Last attempt to quit: 06/23/1995    Years since quitting: 22.8  . Smokeless tobacco: Never Used  Substance and Sexual Activity  . Alcohol use: No    Alcohol/week: 0.0 standard drinks    Comment: 05/27/2016 "nothing since 1996"  . Drug use: No    Comment: 05/27/2016 "nothing since 1996"  . Sexual activity: Yes  Lifestyle  . Physical activity:    Days per week: Not on file    Minutes per session: Not on file  . Stress: Not on file  Relationships  . Social connections:    Talks on phone: Not on file    Gets together: Not on file    Attends religious service: Not on file    Active member of club or  organization: Not on file    Attends meetings of clubs or organizations: Not on file    Relationship status: Not on file  . Intimate partner violence:    Fear of current or ex partner: Not on file    Emotionally abused: Not on file    Physically abused: Not on file    Forced sexual activity: Not on file  Other Topics Concern  . Not on file  Social History Narrative   Married. Education: [employer]. Exercise: Gym 2  times a week fit walking.                     Review of Systems  Constitutional: Negative for fatigue and unexpected weight change.  Respiratory: Negative for chest tightness and shortness of breath.   Cardiovascular: Negative for chest pain, palpitations and leg swelling.  Gastrointestinal: Negative for abdominal pain and blood in stool.  Neurological: Negative for dizziness, syncope, light-headedness and headaches.       Objective:   Physical Exam  Constitutional: She is oriented to person, place, and time. She appears well-developed and well-nourished.  HENT:  Head: Normocephalic and atraumatic.  Eyes: Pupils are equal, round, and reactive to light. Conjunctivae and EOM are normal.  Neck: Carotid bruit is not present.  Cardiovascular: Normal rate, regular rhythm, normal heart sounds and intact distal pulses.  Pulmonary/Chest: Effort normal and breath sounds normal.  Abdominal: Soft. She exhibits no pulsatile midline mass. There is no tenderness.  Neurological: She is alert and oriented to person, place, and time.  Skin: Skin is warm and dry.  Psychiatric: She has a normal mood and affect. Her behavior is normal.  Vitals reviewed.   Vitals:   05/07/18 1650  BP: (!) 145/92  Pulse: 89  Temp: 98.3 F (36.8 C)  TempSrc: Oral  SpO2: 96%  Weight: 192 lb 9.6 oz (87.4 kg)  Height: 5' 8.5" (1.74 m)       Assessment & Plan:  MALEA SWILLING is a 53 y.o. female Hyperglycemia - Plan: Hemoglobin A1c  -Improved on recheck and A1c within normal range.  Continue  to watch diet, activity, consider recheck labs in the next 3 months  Essential hypertension - Plan: Basic metabolic panel  -Borderline but improved control with above changes. continue same dose of medication, monitor home readings and if persistently over 140/90, my chart message or phone call to decide on med changes.   No orders of the defined types were placed in this encounter.  Patient Instructions    Blood pressure is improved. With work changes, less coffee and ADD med changes, we can see if BP continues to improve in next few weeks. If you still obtain higher readings than 140/90, let me know and we can add low dose diuretic.   I will check the blood sugar test and let you know the results.  Please follow-up with me in 3 months, let me know if there are questions prior to that time.   Thanks for coming in today.   If you have lab work done today you will be contacted with your lab results within the next 2 weeks.  If you have not heard from [place] then please contact us. The fastest way to get your results is to register for My Chart.   IF you received an x-Monroe today, you will receive an invoice from [facility]. Please contact [facility] at [phone] with questions or concerns regarding your invoice.   IF you received labwork today, you will receive an invoice from [place]. Please contact [facility] at [phone] with questions or concerns regarding your invoice.   Our billing staff will not be able to assist you with questions regarding bills from these companies.  You will be contacted with the lab results as soon as they are available. The fastest way to get your results is to activate your My Chart account. Instructions are located on the last page of this paperwork. If you have not heard from [place] regarding the  results in 2 weeks, please contact this office.      I personally performed the services described in this documentation, which was scribed in  my presence. The recorded information has been reviewed and considered for accuracy and completeness, addended by me as needed, and agree with information above.  Signed,   Caitlin Ray, MD Primary Care at [place].  05/11/18 4:20 PM

## 2018-05-08 LAB — BASIC METABOLIC PANEL
BUN/Creatinine Ratio: 17 (ref 9–23)
BUN: 15 mg/dL (ref 6–24)
CALCIUM: 9.1 mg/dL (ref 8.7–10.2)
CHLORIDE: 101 mmol/L (ref 96–106)
CO2: 21 mmol/L (ref 20–29)
CREATININE: 0.88 mg/dL (ref 0.57–1.00)
GFR calc Af Amer: 87 mL/min/{1.73_m2} (ref >59)
GFR calc non Af Amer: 76 mL/min/{1.73_m2} (ref >59)
GLUCOSE: 91 mg/dL (ref 65–99)
Potassium: 4.2 mmol/L (ref 3.5–5.2)
Sodium: 139 mmol/L (ref 134–144)

## 2018-05-08 LAB — HEMOGLOBIN A1C
ESTIMATED AVERAGE GLUCOSE: 108 mg/dL
Hgb A1c MFr Bld: 5.4 % (ref 4.8–5.6)

## 2018-05-11 ENCOUNTER — Encounter: Payer: Self-pay | Admitting: Family Medicine

## 2018-06-06 ENCOUNTER — Other Ambulatory Visit: Payer: Self-pay | Admitting: Family Medicine

## 2018-06-06 DIAGNOSIS — I10 Essential (primary) hypertension: Secondary | ICD-10-CM

## 2018-06-07 NOTE — Telephone Encounter (Signed)
Requested medication (s) are due for refill today: yes  Requested medication (s) are on the active medication list: yes  Last refill:  04/06/18  Future visit scheduled: yes  Notes to clinic:  Per protocol details, B/P elevated at last visit.  Requested Prescriptions  Pending Prescriptions Disp Refills   amLODipine (NORVASC) 10 MG tablet [Pharmacy Med Name: AMLODIPINE BESYLATE 10MG  TABLETS] 60 tablet 0    Sig: TAKE 1 TABLET BY MOUTH EVERY DAY. NOTE NEW DOSE. FOLLOW UP ON 05/07/18     Cardiovascular:  Calcium Channel Blockers Failed - 06/06/2018  9:14 PM      Failed - Last BP in normal range    BP Readings from Last 1 Encounters:  05/07/18 (!) 145/92         Passed - Valid encounter within last 6 months    Recent Outpatient Visits          1 month ago Hyperglycemia   [facility], Ranell Patrick, MD   2 months ago Essential hypertension   [facility], Ranell Patrick, MD   6 months ago Essential hypertension   [facility], Ranell Patrick, MD   11 months ago Essential hypertension   [facility], Ranell Patrick, MD   1 year ago Dysuria   [facility] at [facility], Reather Laurence, [place]

## 2018-06-14 ENCOUNTER — Inpatient Hospital Stay: Payer: Commercial Managed Care - PPO | Attending: Hematology

## 2018-06-14 DIAGNOSIS — Z79899 Other long term (current) drug therapy: Secondary | ICD-10-CM | POA: Diagnosis not present

## 2018-06-14 DIAGNOSIS — N951 Menopausal and female climacteric states: Secondary | ICD-10-CM | POA: Diagnosis not present

## 2018-06-14 DIAGNOSIS — Z170 Estrogen receptor positive status [ER+]: Secondary | ICD-10-CM | POA: Diagnosis not present

## 2018-06-14 DIAGNOSIS — Z23 Encounter for immunization: Secondary | ICD-10-CM | POA: Diagnosis not present

## 2018-06-14 DIAGNOSIS — I10 Essential (primary) hypertension: Secondary | ICD-10-CM | POA: Insufficient documentation

## 2018-06-14 DIAGNOSIS — C50411 Malignant neoplasm of upper-outer quadrant of right female breast: Secondary | ICD-10-CM | POA: Insufficient documentation

## 2018-06-14 DIAGNOSIS — F329 Major depressive disorder, single episode, unspecified: Secondary | ICD-10-CM | POA: Insufficient documentation

## 2018-06-14 DIAGNOSIS — Z87891 Personal history of nicotine dependence: Secondary | ICD-10-CM | POA: Insufficient documentation

## 2018-06-14 LAB — COMPREHENSIVE METABOLIC PANEL
ALT: 17 U/L (ref 0–44)
AST: 19 U/L (ref 15–41)
Albumin: 4.2 g/dL (ref 3.5–5.0)
Alkaline Phosphatase: 43 U/L (ref 38–126)
Anion gap: 11 (ref 5–15)
BUN: 13 mg/dL (ref 6–20)
CHLORIDE: 106 mmol/L (ref 98–111)
CO2: 22 mmol/L (ref 22–32)
Calcium: 9 mg/dL (ref 8.9–10.3)
Creatinine, Ser: 0.88 mg/dL (ref 0.44–1.00)
GFR calc non Af Amer: >60 mL/min (ref >60)
Glucose, Bld: 100 mg/dL — ABNORMAL HIGH (ref 70–99)
POTASSIUM: 3.8 mmol/L (ref 3.5–5.1)
Sodium: 139 mmol/L (ref 135–145)
Total Bilirubin: 0.4 mg/dL (ref 0.3–1.2)
Total Protein: 7.2 g/dL (ref 6.5–8.1)

## 2018-06-14 LAB — CBC WITH DIFFERENTIAL/PLATELET
Abs Immature Granulocytes: 0.01 10*3/uL (ref 0.00–0.07)
BASOS PCT: 0 %
Basophils Absolute: 0 10*3/uL (ref 0.0–0.1)
EOS ABS: 0.1 10*3/uL (ref 0.0–0.5)
Eosinophils Relative: 2 %
HCT: 38.3 % (ref 36.0–46.0)
Hemoglobin: 13.3 g/dL (ref 12.0–15.0)
IMMATURE GRANULOCYTES: 0 %
Lymphocytes Relative: 24 %
Lymphs Abs: 1.7 10*3/uL (ref 0.7–4.0)
MCH: 32.9 pg (ref 26.0–34.0)
MCHC: 34.7 g/dL (ref 30.0–36.0)
MCV: 94.8 fL (ref 80.0–100.0)
MONO ABS: 0.4 10*3/uL (ref 0.1–1.0)
MONOS PCT: 6 %
NEUTROS PCT: 68 %
Neutro Abs: 4.6 10*3/uL (ref 1.7–7.7)
PLATELETS: 309 10*3/uL (ref 150–400)
RBC: 4.04 MIL/uL (ref 3.87–5.11)
RDW: 11.9 % (ref 11.5–15.5)
WBC: 6.8 10*3/uL (ref 4.0–10.5)
nRBC: 0 % (ref 0.0–0.2)

## 2018-06-14 NOTE — Progress Notes (Signed)
[place]  [phone] Fax:([phone]) [phone]  Clinic Follow Up Note   Patient Care Team: [name], MD as PCP - General (Family Medicine) Brien Few, MD as Consulting Physician (Obstetrics and Gynecology) Artelia Laroche, CNM as Midwife (Obstetrics and Gynecology) Excell Seltzer, MD as Consulting Physician (General Surgery) [name], MD as Consulting Physician (Hematology) Gery Pray, MD as Consulting Physician (Radiation Oncology)   Date of Service:  06/16/2018  CHIEF COMPLAINT:  Follow up right breast cancer  Oncology History   Breast cancer of upper-outer quadrant of right female breast [facility])   Staging form: Breast, AJCC 7th Edition   - Clinical stage from 04/23/2016: Stage IA (T1c(m), N0, M0) - Signed by [name], MD on 04/30/2016   - Pathologic stage from 05/27/2016: Stage IIA (T2(m), N0, cM0) - Unsigned      Breast cancer of upper-outer quadrant of right female breast ([place])   04/17/2016 Mammogram    Mammogram and US showed a 2.2cm cyst at 12:00, a 1.6cm lobulated mass at 9:00 and a 34m mass at 10:00, axilla was negative     04/23/2016 Initial Diagnosis    Breast cancer of upper-outer quadrant of right female breast ([place]    04/23/2016 Initial Biopsy    Core needle biopsy of right breast masses at 10:00 and 9:30 positions both showed invasive and insitu mammary carcinoma     04/23/2016 Receptors her2    ER 90%+, PR 80-90%+, HER2-, KI67 10-15%    05/12/2016 Procedure    Genetic testing: Negative. Genes analyzed: ATM, BAP1, BARD1, BRCA1, BRCA2, BRIP1, CDH1, CDK4, CDKN2A, CHEK2, EPCAM, FANCC, MITF, MLH1, MSH2, MSH6, NBN, PALB2, PMS2, PTEN, RAD51C, RAD51D, TP53, and XRCC2    05/27/2016 Surgery    Right breast mastectomy and sentinel lymph node biopsy ([place])    05/27/2016 Pathology Results    Right breast mastectomy showed multifocal invasive and in situ lobular carcinoma, 3.2, 0.9 and 0.9 cm, G1 margins were negative, 3 sentinel  lymph nodes were negative.     05/27/2016 Oncotype testing    RS 18, which predicts 10 year distant recurrence risk of 52% with tamoxifen (intermediate risk)     11/23/2016 -  Anti-estrogen oral therapy    Tamoxifen 20 mg daily, she started with 130mdaily for the first month     04/27/2018 Mammogram    Benign      HISTORY OF PRESENTING ILLNESS (04/30/2016):  AbMax Fickle52.o. female is here because of her recently diagnosed right breast cancer. She is accompanied by her friend and sister to our multidisciplinary breast clinic today.   She felt a breast lump about a few weeks ago. She denies any breast pain, skin change or nipple discharge. She had normal screening mammogram in April 2017. She was seen by her primary care physician for the breast mass, and underwent diagnostic mammogram and ultrasound on 04/17/2016, which showed a 2.2 cm cyst at 12:00 position, a 1.6 cm lobulated mass at 9:00 and a 6 mm mass at 10:00 position, ultrasound of the axilla was negative. She underwent ultrasound guided core needle biopsy of both right breast mass, which all showed invasive lobular carcinoma, ER/PR strongly positive, HER-2 negative.  She feels very well overall, denies any pain or other symptoms. She is physically active, has been quite anxious since her cancer diagnosis.  GYN HISTORY  Menarchal: 12 LMP: [age]hysrectomy)  Contraceptive: 10, stopped in 30's  HRT: none  G1P1: one daughter, 1046o   CURRENT  THERAPY: adjuvant Tamoxifen 75m dialy, she started on 11/23/2016 and had 12mdaily for the first month   INTERIM HISTORY:  AbMarrioneturns for follow-up of her right breast cancer. She has been following up with Dr. GrCarlota Raspberryn the interim. Her 2019 mammogram was benign.  Today, she is here alone at the clinic. She states that she feels stressed due to her current work, and has been following up with Dr. GrCarlota Raspberryor her blood pressure. She states that she monitors her BP at home, and that it  doesn't usually run high, but she gets nervous when visiting the clinic. She states that she's been fasting from 8 PM till 12 PM to avoid weight gain, but she drink fluids to stay hydrated.  She states that she experiences very mild hot flashes, and has noticed that her UTIs have improved with hydration and cranberry juice. She is trying to stay active and exercise when possible.  She states that her depression has improved.      MEDICAL HISTORY:  Past Medical History:  Diagnosis Date  . Anxiety   . Breast cancer of upper-outer quadrant of right female breast ([place]  . Depression   . Family history of breast cancer   . Family history of prostate cancer   . Hypertension    "because of stress" (05/27/2016)  . PONV (postoperative nausea and vomiting)   . Seasonal allergies     SURGICAL HISTORY: Past Surgical History:  Procedure Laterality Date  . ABDOMINAL HYSTERECTOMY  Aug 2011   [employer] TAH for fibroids  . BREAST BIOPSY  03/2016  . FOOT NEUROMA SURGERY Right 2000?  . Marland KitchenASTECTOMY COMPLETE / SIMPLE W/ SENTINEL NODE BIOPSY Right 05/27/2016  . MASTECTOMY W/ SENTINEL NODE BIOPSY Right 05/27/2016   Procedure: RIGHT TOTAL MASTECTOMY WITH AXILLARY SENTINEL LYMPH NODE BIOPSY;  Surgeon: BeExcell SeltzerMD;  Location: [facility] Service: General;  Laterality: Right;  . TONSILLECTOMY    . WISDOM TOOTH EXTRACTION  ~ 1985    SOCIAL HISTORY: Social History   Socioeconomic History  . Marital status: Legally Separated    Spouse name: Not on file  . Number of children: Not on file  . Years of education: Not on file  . Highest education level: Not on file  Occupational History  . Occupation: LaEducation administratorSocial Needs  . Financial resource strain: Not on file  . Food insecurity:    Worry: Not on file    Inability: Not on file  . Transportation needs:    Medical: Not on file    Non-medical: Not on file  Tobacco Use  . Smoking status: Former Smoker    Packs/day: 0.50    Years:  15.00    Pack years: 7.50    Types: Cigarettes    Last attempt to quit: 06/23/1995    Years since quitting: 22.9  . Smokeless tobacco: Never Used  Substance and Sexual Activity  . Alcohol use: No    Alcohol/week: 0.0 standard drinks    Comment: 05/27/2016 "nothing since 1996"  . Drug use: No    Comment: 05/27/2016 "nothing since 1996"  . Sexual activity: Yes  Lifestyle  . Physical activity:    Days per week: Not on file    Minutes per session: Not on file  . Stress: Not on file  Relationships  . Social connections:    Talks on phone: Not on file    Gets together: Not on file    Attends religious service:  Not on file    Active member of club or organization: Not on file    Attends meetings of clubs or organizations: Not on file    Relationship status: Not on file  . Intimate partner violence:    Fear of current or ex partner: Not on file    Emotionally abused: Not on file    Physically abused: Not on file    Forced sexual activity: Not on file  Other Topics Concern  . Not on file  Social History Narrative   Married. Education: [employer]. Exercise: Gym 2 times a week fit walking.                      FAMILY HISTORY: Family History  Problem Relation Age of Onset  . Hypertension Mother   . Depression Mother   . Hyperlipidemia Mother   . Heart disease Mother   . Prostate cancer Father 67  . Mental illness Sister   . Hypertension Sister   . Prostate cancer Brother        dx in his late 12s  . Mental illness Maternal Grandmother   . Breast cancer Maternal Grandmother        dx in her 90s  . Lung cancer Maternal Grandmother        smoker since age 34; dx in her 53s  . Cancer Maternal Grandfather   . Heart disease Maternal Grandfather   . Mental illness Maternal Grandfather   . Lung cancer Paternal Grandmother        dx in her 56s, life long smoker  . Prostate cancer Paternal Grandfather   . Allergies Daughter   . Anxiety disorder Daughter   . Melanoma Brother  39  . Bladder Cancer Sister 24    ALLERGIES:  has No Known Allergies.  MEDICATIONS:  Current Outpatient Medications  Medication Sig Dispense Refill  . amLODipine (NORVASC) 10 MG tablet TAKE 1 TABLET BY MOUTH EVERY DAY. NOTE NEW DOSE. FOLLOW UP ON 05/07/18 60 tablet 0  . amphetamine-dextroamphetamine (ADDERALL) 10 MG tablet TK 38m  PO  QAM  0  . ASHWAGANDHA PO Take 1,000 mg by mouth. Takes 2 tabs nightly.    . cephALEXin (KEFLEX) 500 MG capsule TK ONE C PO BID FOR 3 DAYS PRN OR 1 C PO AFTER INTERCOURSE  11  . cholecalciferol (VITAMIN D) 1000 UNITS tablet Take 6,000 Units by mouth daily.     .Marland Kitchenibuprofen (ADVIL,MOTRIN) 200 MG tablet Take 200 mg by mouth every 6 (six) hours as needed for mild pain.    . Lactobacillus Rhamnosus, GG, (CULTURELLE PO) Take by mouth.    . loratadine (CLARITIN) 10 MG tablet Take 10 mg by mouth daily.    . magnesium 30 MG tablet Take 500 mg by mouth daily.     . Multiple Vitamins-Minerals (MULTIVITAMIN WITH MINERALS) tablet Take 1 tablet by mouth daily.    . Omega-3 Fatty Acids (FISH OIL PO) Take 1 capsule by mouth daily.     . tamoxifen (NOLVADEX) 20 MG tablet Take 1 tablet (20 mg total) by mouth daily. 90 tablet 3  . valACYclovir (VALTREX) 500 MG tablet TK 1 T PO D  4  . venlafaxine (EFFEXOR) 75 MG tablet Take 75 mg by mouth daily.     No current facility-administered medications for this visit.     REVIEW OF SYSTEMS:   Constitutional: Denies fevers, chills or abnormal night sweats (+) hot flashes Eyes: Denies blurriness of vision, double  vision or watery eyes Ears, nose, mouth, throat, and face: Denies mucositis or sore throat Respiratory: Denies cough, dyspnea or wheezes Cardiovascular: Denies palpitation, chest discomfort or lower extremity swelling Gastrointestinal:  Denies nausea, heartburn or change in bowel habits GU: No new urinary symptoms, UTIs improved Skin: Denies abnormal skin rashes  Lymphatics: Denies new lymphadenopathy or easy  bruising Neurological:Denies numbness, tingling or new weaknesses Behavioral/Psych: Mood is stable, no new changes (+) occasional anxiety  All other systems were reviewed with the patient and are negative.  PHYSICAL EXAMINATION:  ECOG PERFORMANCE STATUS: 0 - Asymptomatic  Vitals:   06/16/18 0822  BP: (!) 141/107  Pulse: (!) 105  Resp: 18  Temp: 98.5 F (36.9 C)  SpO2: 100%   Filed Weights   06/16/18 0822  Weight: 192 lb 4.8 oz (87.2 kg)    GENERAL:alert, no distress and comfortable SKIN: skin color, texture, turgor are normal, no rashes or significant lesions EYES: normal, conjunctiva are pink and non-injected, sclera clear OROPHARYNX:no exudate, no erythema and lips, buccal mucosa, and tongue normal  NECK: supple, thyroid normal size, non-tender, without nodularity LYMPH:  no palpable lymphadenopathy in the cervical, axillary or inguinal LUNGS: clear to auscultation and percussion with normal breathing effort HEART: regular rate & rhythm and no murmurs and no lower extremity edema ABDOMEN:abdomen soft, non-tender and normal bowel sounds Musculoskeletal:no cyanosis of digits and no clubbing  PSYCH: alert & oriented x 3 with fluent speech NEURO: no focal motor/sensory deficits  Breasts: (+) Right breast is surgically absent, the incision is well healed, no palpable abnormalities, discharge or skin discoloration. Palpation of the left beast and bilateral axillas revealed no obvious mass that I could appreciate.   LABORATORY DATA:  I have reviewed the data as listed CBC Latest Ref Rng & Units 06/14/2018 03/15/2018 10/26/2017  WBC 4.0 - 10.5 K/uL 6.8 6.3 7.8  Hemoglobin 12.0 - 15.0 g/dL 13.3 13.9 13.9  Hematocrit 36.0 - 46.0 % 38.3 40.0 40.1  Platelets 150 - 400 K/uL 309 308 334   CMP Latest Ref Rng & Units 06/14/2018 05/07/2018 03/15/2018  Glucose 70 - 99 mg/dL 100(H) 91 102(H)  BUN 6 - 20 mg/dL 13 15 <2(L)  Creatinine 0.44 - 1.00 mg/dL 0.88 0.88 0.87  Sodium 135 - 145 mmol/L  139 139 139  Potassium 3.5 - 5.1 mmol/L 3.8 4.2 4.0  Chloride 98 - 111 mmol/L 106 101 107  CO2 22 - 32 mmol/L '22 21 23  ' Calcium 8.9 - 10.3 mg/dL 9.0 9.1 9.0  Total Protein 6.5 - 8.1 g/dL 7.2 - 7.2  Total Bilirubin 0.3 - 1.2 mg/dL 0.4 - 0.5  Alkaline Phos 38 - 126 U/L 43 - 47  AST 15 - 41 U/L 19 - 21  ALT 0 - 44 U/L 17 - 17   PATHOLOGY REPORT  Diagnosis 04/23/2016 1. Breast, right, needle core biopsy, 10 o'clock - INVASIVE AND IN SITU MAMMARY CARCINOMA. 2. Breast, right, needle core biopsy, 9:30 o'clock - INVASIVE AND IN SITU MAMMARY CARCINOMA.  Microscopic Comment 1. , 2. E-Cadherin and breast prognostic profile will be performed.  1. Results: IMMUNOHISTOCHEMICAL AND MORPHOMETRIC ANALYSIS PERFORMED MANUALLY Estrogen Receptor: 90%, POSITIVE, STRONG STAINING INTENSITY Progesterone Receptor: 80%, POSITIVE, STRONG STAINING INTENSITY Proliferation Marker Ki67: 10%  Results: HER2 - NEGATIVE RATIO OF HER2/CEP17 SIGNALS 1.60 AVERAGE HER2 COPY NUMBER PER CELL 2.00  2. PROGNOSTIC INDICATORS Results: IMMUNOHISTOCHEMICAL AND MORPHOMETRIC ANALYSIS PERFORMED MANUALLY Estrogen Receptor: 90%, POSITIVE, STRONG STAINING INTENSITY Progesterone Receptor: 90%, POSITIVE, STRONG STAINING INTENSITY Proliferation Marker  Ki67: 15%  Results: HER2 - NEGATIVE RATIO OF HER2/CEP17 SIGNALS 1.36 AVERAGE HER2 COPY NUMBER PER CELL 1.70  Diagnosis 05/27/2016 1. Breast, simple mastectomy, Right - MULTIFOCAL INVASIVE AND IN SITU LOBULAR CARCINOMA, 3.2, 0.9, AND 0.9 CM. - MULTIFOCAL LOBULAR CARCINOMA IN SITU. - MARGINS NOT INVOLVED. - FIBROCYSTIC CHANGES WITH PSEUDOANGIOMATOUS STROMAL HYPERPLASIA. - PREVIOUS BIOPSY SITES. 2. Lymph node, sentinel, biopsy, Right axillary #1 - ONE BENIGN LYMPH NODE (0/1). 3. Lymph node, sentinel, biopsy, Right axillary #2 - ONE BENIGN LYMPH NODE (0/1). 4. Lymph node, sentinel, biopsy, Right axillary #3 - ONE BENIGN LYMPH NODE (0/1). Microscopic Comment 1. BREAST,  INVASIVE TUMOR, WITH LYMPH NODES PRESENT Specimen, including laterality and lymph node sampling (sentinel, non-sentinel): Right breast and three sentinel lymph nodes. Procedure: Mastectomy and sentinel lymph node biopsies. Histologic type: Lobular. Grade: 1 Tubule formation: 3 Nuclear pleomorphism: 1 Mitotic:1 Tumor size (gross measurement): 3.2, 0.9, and 0.9 cm Margins: Free of tumor. Invasive, distance to closest margin: 0.9 cm from anterior margin. In-situ, distance to closest margin: 0.9 cm from anterior margin. If margin positive, focally or broadly: N/A Lymphovascular invasion: Not identified. Ductal carcinoma in situ: No. Grade: N/A 1 of 4 FINAL for Lafferty, Jalaiyah C ([id number]) Microscopic Comment(continued) Extensive intraductal component: N/A Lobular neoplasia: Yes, lobular carcinoma in situ. Tumor focality: Multifocal. Extent of tumor: Skin: Free of tumor Nipple: Free of tumor. Skeletal muscle: Free of tumor. Lymph nodes: Examined: 3 Sentinel 0 Non-sentinel 3 Total Lymph nodes with metastasis: 0 Isolated tumor cells (< 0.2 mm): 0 Micrometastasis: (> 0.2 mm and < 2.0 mm): 0 Macrometastasis: (> 2.0 mm): 0 Extracapsular extension: N/A Breast prognostic profile: Case [id number]. Estrogen receptor: 90%, positive, strong staining. Progesterone receptor: 90%, positive, strong staining. Her 2 neu: Negative, ratio 1.36 and 1.6. Ki-67: 10% and 15%. Non-neoplastic breast: Fibrocystic changes and pseudoangiomatous stromal hyperplasia. TNM: pT2(multifocal), pN0, pMX Comments: There are three nodules of invasive and in situ lobular carcinoma which are 3.2, 0.9, and 0.9 cm. There are also separate foci of lobular carcinoma in situ. The margins of the specimen are not involved by invasive or in situ lobular carcinoma. Immunohistochemistry for cytokeratin AE1/AE3 is performed on all of the sentinel lymph nodes and no positivity is identified. (JDP:gt, 05/29/16)  Oncotype  Dx RS 18, which predicts 10 year distant recurrence risk of 52% with tamoxifen  RADIOGRAPHIC STUDIES: I have personally reviewed the radiological images as listed and agreed with the findings in the report. No results found.   04/29/2018 Mammogram    Breast MRI b/l in 07/29/17  IMPRESSION: 1. No MR evidence of breast malignancy. 2. Status post right mastectomy. RECOMMENDATION: Left screening mammogram in 1 year.  ASSESSMENT & PLAN:  52 y.o. Caucasian female, no significant past medical history, presented with a palpable right breast mass  1. Breast cancer of upper outer quadrant of right breast, invasive lobular carcinoma, mpT2N0M0, stage IIA, ER+/PR+/HER2-, G1, RS 18  --I previously discussed her surgical path result in details. -She had multifocal disease, surgical margins were negative. Nodes were negative. -the Oncotype Dx result was previously with her in details. She has intermedia risk based on the recurrence score, which predicts 10 year distant recurrence after 5 years of tamoxifen 12%. The benefit of chemotherapy in the intermedia risk group is small and controversial. Given her relatively low score in the intermedia group, I did not recommend adjuvant chemotherapy. She agrees with the plan -Giving the strong ER and PR expression in her tumor, I recommend adjuvant endocrine therapy Tamoxifen  -  She has finally started adjuvant tamoxifen, tolerated full dose 22m daily well.  She has had some side effects, such as hot flash, frequent UTI, weight gain, hyperglycemia, etc., dose has improved lately. --She is not interested in breast reconstruction surgery at this time. -Her 2019 mammogram was benign. I discussed with her.  -Continue annual screening mammogram of left breast, next in September 2020.  Due to her dense breast tissue, she is interested in breast MRI for screening, next due in March 2020. -FPoquosonshow that she is still premenopausal. I discussed with her. Will continue  tamoxifen.  She is not interested in ovarian suppression. -She is clinically doing well, exam was unremarkable, lab results reviewed with patient.  No clinical concern for recurrence. -I educated her today about maintaining a healthy diet and exercising at least 3 times a week, and try to lose some weight.  He is agreeable. -Continue breast cancer surveillance. I will see her in 6 months.  2. Bone health -We previously discussed the effect on bone density from tamoxifen or aromatase inhibitor -I encouraged her to take calcium and vitamin D  3. Hypertension, depression  -She has stopped Wellbutrin, and has stopped Zoloft -Continue follow-up with her primary care physician  -She is currently on low dose amlodpaine. Given her elevated BP at home I suggest she see Dr. GCarlota Raspberryabout increasing her Amlodpaine.  -She has started taking Effexor and is feeling well. If her mood and hot flashes increase with Tamoxifen she is fine to increase her Effexor. I recommend she also gets counseling.  -BP today is 141/107. She states that she monitors her BP at home, and her BP has been normal at home -Her depression have improved now.  -She will see Dr. GCarlota Raspberrysoon.  4. Recurrent UTI  -she is on prophylactic Keflex, and to sees urologist Dr. MTresa Moore -have much improved now. She is currently not complaining of urinary symptoms   Plan -Continue Tamoxifen 20 mg daily -f/u in 6 months -Next breast MRI in March 2020, ordered today   Orders Placed This Encounter  Procedures  . [place] BREAST BILATERAL [place]    OK to do fast MRI if pt prefers    Standing Status:   Future    Standing Expiration Date:   08/17/2019    Order Specific Question:   If indicated for the ordered procedure, I authorize the administration of contrast media per Radiology protocol    Answer:   Yes    Order Specific Question:   What is the patient's sedation requirement?    Answer:   No Sedation    Order Specific Question:    Does the patient have a pacemaker or implanted devices?    Answer:   No    Order Specific Question:   Radiology Contrast Protocol - do NOT remove file path    Answer:   [email or web address]    Order Specific Question:   ** REASON FOR EXAM (FREE TEXT)    Answer:   screening    Order Specific Question:   Preferred imaging location?    Answer:   [facility] (table limit-550lbs)    All questions were answered. The patient knows to call the clinic with any problems, questions or concerns. I spent 20 minutes counseling the patient face to face. The total time spent in the appointment was 25 minutes and more than 50% was on counseling.  IDierdre SearlesDweik am acting as scribe for Dr. YTruitt Merle  I have reviewed the above documentation for accuracy and completeness, and I agree with the above.     [name], MD 06/16/2018

## 2018-06-15 LAB — FOLLICLE STIMULATING HORMONE: FSH: 17 m[IU]/mL

## 2018-06-15 LAB — ESTRADIOL: ESTRADIOL: 417.6 pg/mL

## 2018-06-16 ENCOUNTER — Encounter: Payer: Self-pay | Admitting: Hematology

## 2018-06-16 ENCOUNTER — Telehealth: Payer: Self-pay | Admitting: Hematology

## 2018-06-16 ENCOUNTER — Inpatient Hospital Stay (HOSPITAL_BASED_OUTPATIENT_CLINIC_OR_DEPARTMENT_OTHER): Payer: Commercial Managed Care - PPO | Admitting: Hematology

## 2018-06-16 VITALS — BP 141/107 | HR 105 | Temp 98.5°F | Resp 18 | Ht 68.0 in | Wt 192.3 lb

## 2018-06-16 DIAGNOSIS — I10 Essential (primary) hypertension: Secondary | ICD-10-CM | POA: Diagnosis not present

## 2018-06-16 DIAGNOSIS — C50411 Malignant neoplasm of upper-outer quadrant of right female breast: Secondary | ICD-10-CM | POA: Diagnosis not present

## 2018-06-16 DIAGNOSIS — Z23 Encounter for immunization: Secondary | ICD-10-CM

## 2018-06-16 DIAGNOSIS — F329 Major depressive disorder, single episode, unspecified: Secondary | ICD-10-CM

## 2018-06-16 DIAGNOSIS — Z170 Estrogen receptor positive status [ER+]: Secondary | ICD-10-CM | POA: Diagnosis not present

## 2018-06-16 DIAGNOSIS — Z87891 Personal history of nicotine dependence: Secondary | ICD-10-CM

## 2018-06-16 DIAGNOSIS — N951 Menopausal and female climacteric states: Secondary | ICD-10-CM

## 2018-06-16 MED ORDER — INFLUENZA VAC SPLIT QUAD 0.5 ML IM SUSY
0.5000 mL | PREFILLED_SYRINGE | Freq: Once | INTRAMUSCULAR | Status: AC
  Administered 2018-06-16: 0.5 mL via INTRAMUSCULAR

## 2018-06-16 MED ORDER — INFLUENZA VAC SPLIT QUAD 0.5 ML IM SUSY
PREFILLED_SYRINGE | INTRAMUSCULAR | Status: AC
  Filled 2018-06-16: qty 0.5

## 2018-06-16 NOTE — Telephone Encounter (Signed)
Appts scheduled avs/calendar declined due to to my chart per 10/23 los

## 2018-06-29 ENCOUNTER — Other Ambulatory Visit: Payer: Self-pay

## 2018-06-29 ENCOUNTER — Ambulatory Visit: Payer: Commercial Managed Care - PPO | Admitting: Family Medicine

## 2018-06-29 ENCOUNTER — Other Ambulatory Visit: Payer: Self-pay | Admitting: Family Medicine

## 2018-06-29 VITALS — BP 140/96 | HR 101 | Temp 99.0°F | Ht 69.0 in | Wt 189.2 lb

## 2018-06-29 DIAGNOSIS — I10 Essential (primary) hypertension: Secondary | ICD-10-CM | POA: Diagnosis not present

## 2018-06-29 DIAGNOSIS — G44209 Tension-type headache, unspecified, not intractable: Secondary | ICD-10-CM | POA: Diagnosis not present

## 2018-06-29 DIAGNOSIS — R000 Tachycardia, unspecified: Secondary | ICD-10-CM | POA: Diagnosis not present

## 2018-06-29 MED ORDER — HYDROCHLOROTHIAZIDE 12.5 MG PO CAPS: 12.5000 mg | ORAL_CAPSULE | Freq: Every day | ORAL | 1 refills | Status: DC

## 2018-06-29 MED ORDER — AMLODIPINE BESYLATE 10 MG PO TABS: ORAL_TABLET | ORAL | 1 refills | Status: DC

## 2018-06-29 NOTE — Patient Instructions (Addendum)
Ok to continue amlodipine for now, but add hydrochlorothiazide 1 pill/day.  I would expect to see some improvement in your blood pressure this week.  Call with an update in the next 2 weeks.  Headache is likely due to tension or stress headache, but if any new or worsening symptoms be seen here or emergency room if needed.  Either way follow-up in the next 4 to 6 weeks to repeat blood pressure assessment.  Thank you for coming in today.   If you have lab work done today you will be contacted with your lab results within the next 2 weeks.  If you have not heard from [place] then please contact us. The fastest way to get your results is to register for My Chart.   IF you received an x-ray today, you will receive an invoice from [facility]. Please contact [facility] at [phone] with questions or concerns regarding your invoice.   IF you received labwork today, you will receive an invoice from [place]. Please contact [facility] at [phone] with questions or concerns regarding your invoice.   Our billing staff will not be able to assist you with questions regarding bills from these companies.  You will be contacted with the lab results as soon as they are available. The fastest way to get your results is to activate your My Chart account. Instructions are located on the last page of this paperwork. If you have not heard from [place] regarding the results in 2 weeks, please contact this office.

## 2018-06-29 NOTE — Progress Notes (Signed)
Subjective:  By signing my name below, I, Caitlin Monroe, attest that this documentation has been prepared under the direction and in the presence of [name], MD Electronically Signed: [name], ED Scribe 06/29/2018 at 11:55 AM.   Patient ID: Caitlin Monroe, female    DOB: 1966/04/30, 52 y.o.   MRN: [id number]  Chief Complaint  Patient presents with  . Hypertension   HPI Caitlin Monroe is a 52 y.o. female who presents to Primary Care at [facility] for f/u of HTN. Last seen 9/13. Borderline at 145/92. Had been taking amlodipine 10 mg and other approaches to reducing BP, including decreasing adderall in 1/2 and left her prev job. Home readings in 110s-120s/80s-90s. Decided to remain on same dose amlodipine. BP was sig higher at 06/16/18 oncology OV at 141/107. Thought that pressure runs higher in office than at home.  Lab Results  Component Value Date   CREATININE 0.88 06/14/2018   BP Readings from Last 3 Encounters:  06/29/18 (!) 140/96  06/16/18 (!) 141/107  05/07/18 (!) 145/92  Pt reports home readings of 125/88 at rest, mostly in the 140s-150s/90s. She is still taking Effexor and 10 mg amlodipine qhs. She does report a cramping sensation on the R neck/R side of her head that she suspects are tension HAs. Reports she has been under a lot of stress lately. She was with her father when he took his last breath last wk at age 78. She does report having a good therapist and support system; states she nothing is overwhelming, just a lot to carry. She has also started running recently. Pt does report hot flashes which she attributes to tamoxifen. Denies changes in hair, skin, weight, heart palpitations, cp, sob, blurred vision, weakness, lightheadedness.  Patient Active Problem List   Diagnosis Date Noted  . Breast cancer, right ([place]) 05/27/2016  . Genetic testing 05/13/2016  . Invasive carcinoma of breast ([place]) 05/06/2016  . Family history of breast cancer   . Family history  of prostate cancer   . Breast cancer of upper-outer quadrant of right female breast ([place]) 04/25/2016  . Depression 06/27/2015  . Seasonal affective disorder ([place]) 06/27/2015  . Allergic rhinitis 04/05/2013  . Depression with anxiety 04/05/2013   Past Medical History:  Diagnosis Date  . Anxiety   . Breast cancer of upper-outer quadrant of right female breast ([place]) 04/25/2016  . Depression   . Family history of breast cancer   . Family history of prostate cancer   . Hypertension    "because of stress" (05/27/2016)  . PONV (postoperative nausea and vomiting)   . Seasonal allergies    Past Surgical History:  Procedure Laterality Date  . ABDOMINAL HYSTERECTOMY  Aug 2011   Da Vinci TAH for fibroids  . BREAST BIOPSY  03/2016  . FOOT NEUROMA SURGERY Right 2000?  Marland Kitchen MASTECTOMY COMPLETE / SIMPLE W/ SENTINEL NODE BIOPSY Right 05/27/2016  . MASTECTOMY W/ SENTINEL NODE BIOPSY Right 05/27/2016   Procedure: RIGHT TOTAL MASTECTOMY WITH AXILLARY SENTINEL LYMPH NODE BIOPSY;  Surgeon: Excell Seltzer, MD;  Location: [place];  Service: General;  Laterality: Right;  . TONSILLECTOMY    . WISDOM TOOTH EXTRACTION  ~ 1985   No Known Allergies Prior to Admission medications   Medication Sig Start Date End Date Taking? Authorizing Provider  amLODipine (NORVASC) 10 MG tablet TAKE 1 TABLET BY MOUTH EVERY DAY. NOTE NEW DOSE. FOLLOW UP ON 05/07/18 06/08/18   [name], MD  amphetamine-dextroamphetamine (ADDERALL) 10 MG  tablet TK 15mg   PO  QAM 06/25/17   [provider]  ASHWAGANDHA PO Take 1,000 mg by mouth. Takes 2 tabs nightly.    [provider]  cephALEXin (KEFLEX) 500 MG capsule TK ONE C PO BID FOR 3 DAYS PRN OR 1 C PO AFTER INTERCOURSE 11/07/17   [provider]  cholecalciferol (VITAMIN D) 1000 UNITS tablet Take 6,000 Units by mouth daily.     [provider]  ibuprofen (ADVIL,MOTRIN) 200 MG tablet Take 200 mg by mouth every 6 (six) hours as needed for mild pain.     [provider]  Lactobacillus Rhamnosus, GG, (CULTURELLE PO) Take by mouth.    [provider]  loratadine (CLARITIN) 10 MG tablet Take 10 mg by mouth daily.    [provider]  magnesium 30 MG tablet Take 500 mg by mouth daily.     [provider]  Multiple Vitamins-Minerals (MULTIVITAMIN WITH MINERALS) tablet Take 1 tablet by mouth daily.    [provider]  Omega-3 Fatty Acids (FISH OIL PO) Take 1 capsule by mouth daily.     [provider]  tamoxifen (NOLVADEX) 20 MG tablet Take 1 tablet (20 mg total) by mouth daily. 12/10/17   [name], MD  valACYclovir (VALTREX) 500 MG tablet TK 1 T PO D 08/30/17   [provider]  venlafaxine (EFFEXOR) 75 MG tablet Take 75 mg by mouth daily.    [provider]   Social History   Socioeconomic History  . Marital status: Legally Separated    Spouse name: Not on file  . Number of children: Not on file  . Years of education: Not on file  . Highest education level: Not on file  Occupational History  . Occupation: Education administrator  Social Needs  . Financial resource strain: Not on file  . Food insecurity:    Worry: Not on file    Inability: Not on file  . Transportation needs:    Medical: Not on file    Non-medical: Not on file  Tobacco Use  . Smoking status: Former Smoker    Packs/day: 0.50    Years: 15.00    Pack years: 7.50    Types: Cigarettes    Last attempt to quit: 06/23/1995    Years since quitting: 23.0  . Smokeless tobacco: Never Used  Substance and Sexual Activity  . Alcohol use: No    Alcohol/week: 0.0 standard drinks    Comment: 05/27/2016 "nothing since 1996"  . Drug use: No    Comment: 05/27/2016 "nothing since 1996"  . Sexual activity: Yes  Lifestyle  . Physical activity:    Days per week: Not on file    Minutes per session: Not on file  . Stress: Not on file  Relationships  . Social connections:    Talks on phone: Not on file    Gets together: Not on  file    Attends religious service: Not on file    Active member of club or organization: Not on file    Attends meetings of clubs or organizations: Not on file    Relationship status: Not on file  . Intimate partner violence:    Fear of current or ex partner: Not on file    Emotionally abused: Not on file    Physically abused: Not on file    Forced sexual activity: Not on file  Other Topics Concern  . Not on file  Social History Narrative   Married. Education:  College. Exercise: Gym 2 times a week fit walking.                     Review of Systems  Constitutional: Negative for fatigue and unexpected weight change.  Eyes: Negative for visual disturbance.  Respiratory: Negative for chest tightness and shortness of breath.   Cardiovascular: Negative for chest pain, palpitations and leg swelling.  Gastrointestinal: Negative for abdominal pain and blood in stool.  Neurological: Positive for headaches. Negative for dizziness, syncope, weakness and light-headedness.      Objective:   Physical Exam  Constitutional: She is oriented to person, place, and time. She appears well-developed and well-nourished.  HENT:  Head: Normocephalic and atraumatic.  Eyes: Pupils are equal, round, and reactive to light. Conjunctivae and EOM are normal.  Neck: Carotid bruit is not present.  Slight spasm of R paraspinal muscle. No rash. Area of discomfort at R posterior scalp.  Cardiovascular: Normal rate, regular rhythm, normal heart sounds and intact distal pulses.  Pulmonary/Chest: Effort normal and breath sounds normal.  Abdominal: Soft. She exhibits no pulsatile midline mass. There is no tenderness.  Neurological: She is alert and oriented to person, place, and time.  Skin: Skin is warm and dry.  Psychiatric: She has a normal mood and affect. Her behavior is normal.  Vitals reviewed.  Vitals:   06/29/18 1136 06/29/18 1138  BP: (!) 161/111 (!) 140/96  Pulse: (!) 101   Temp: 99 F (37.2 C)     TempSrc: Oral   SpO2: 99%   Weight: 189 lb 3.2 oz (85.8 kg)   Height: 5\' 9"  (1.753 m)       Assessment & Plan:   MIYU FENDERSON is a 52 y.o. female Tachycardia - Plan: TSH  Essential hypertension - Plan: TSH, hydrochlorothiazide (MICROZIDE) 12.5 MG capsule, amLODipine (NORVASC) 10 MG tablet  Tension headache  Uncontrolled hypertension, with slight tachycardia.  Suspect some situational stressors with recent death in the family contributing.   -  Will increase control with adding HCTZ 12.5 mg daily, continue 10 mg daily, potential side effects discussed.  Check TSH with tachycardia, but unlikely most likely secondary hypertension.  Recheck next 4 to 6 weeks, sooner if worsening.  Likely tension/stress headache.  RTC/ER precautions discussed if any worsening/new symptoms.   Meds ordered this encounter  Medications  . hydrochlorothiazide (MICROZIDE) 12.5 MG capsule    Sig: Take 1 capsule (12.5 mg total) by mouth daily.    Dispense:  30 capsule    Refill:  1  . amLODipine (NORVASC) 10 MG tablet    Sig: TAKE 1 TABLET BY MOUTH EVERY DAY.    Dispense:  90 tablet    Refill:  1   Patient Instructions   Ok to continue amlodipine for now, but add hydrochlorothiazide 1 pill/day.  I would expect to see some improvement in your blood pressure this week.  Call with an update in the next 2 weeks.  Headache is likely due to tension or stress headache, but if any new or worsening symptoms be seen here or emergency room if needed.  Either way follow-up in the next 4 to 6 weeks to repeat blood pressure assessment.  Thank you for coming in today.   If you have lab work done today you will be contacted with your lab results within the next 2 weeks.  If you have not heard from [place] then please contact us. The fastest way to get your results is to register for  My Chart.   IF you received an x-ray today, you will receive an invoice from [facility]. Please contact [facility]  at [phone] with questions or concerns regarding your invoice.   IF you received labwork today, you will receive an invoice from [place]. Please contact [facility] at [phone] with questions or concerns regarding your invoice.   Our billing staff will not be able to assist you with questions regarding bills from these companies.  You will be contacted with the lab results as soon as they are available. The fastest way to get your results is to activate your My Chart account. Instructions are located on the last page of this paperwork. If you have not heard from [place] regarding the results in 2 weeks, please contact this office.       I personally performed the services described in this documentation, which was scribed in my presence. The recorded information has been reviewed and considered for accuracy and completeness, addended by me as needed, and agree with information above.  Signed,   [name], MD Primary [facility] at [place].  06/29/18 12:36 PM

## 2018-06-30 LAB — TSH: TSH: 1.09 u[IU]/mL (ref 0.450–4.500)

## 2018-07-01 ENCOUNTER — Other Ambulatory Visit: Payer: Self-pay | Admitting: Family Medicine

## 2018-08-06 ENCOUNTER — Ambulatory Visit: Payer: Commercial Managed Care - PPO | Admitting: Family Medicine

## 2018-08-30 ENCOUNTER — Other Ambulatory Visit: Payer: Self-pay | Admitting: Family Medicine

## 2018-08-30 DIAGNOSIS — I10 Essential (primary) hypertension: Secondary | ICD-10-CM

## 2018-08-31 NOTE — Telephone Encounter (Signed)
Requested Prescriptions  Pending Prescriptions Disp Refills  . hydrochlorothiazide (MICROZIDE) 12.5 MG capsule [Pharmacy Med Name: HYDROCHLOROTHIAZIDE 12.5MG  CAPSULES] 30 capsule 1    Sig: TAKE 1 CAPSULE(12.5 MG) BY MOUTH DAILY     Cardiovascular: Diuretics - Thiazide Failed - 08/30/2018 11:45 AM      Failed - Last BP in normal range    BP Readings from Last 1 Encounters:  06/29/18 (!) 140/96         Passed - Ca in normal range and within 360 days    Calcium  Date Value Ref Range Status  06/14/2018 9.0 8.9 - 10.3 mg/dL Final  04/16/2017 8.9 8.4 - 10.4 mg/dL Final         Passed - Cr in normal range and within 360 days    Creatinine  Date Value Ref Range Status  04/16/2017 0.8 0.6 - 1.1 mg/dL Final   Creatinine, Ser  Date Value Ref Range Status  06/14/2018 0.88 0.44 - 1.00 mg/dL Final         Passed - K in normal range and within 360 days    Potassium  Date Value Ref Range Status  06/14/2018 3.8 3.5 - 5.1 mmol/L Final  04/16/2017 4.5 3.5 - 5.1 mEq/L Final         Passed - Na in normal range and within 360 days    Sodium  Date Value Ref Range Status  06/14/2018 139 135 - 145 mmol/L Final  05/07/2018 139 134 - 144 mmol/L Final  04/16/2017 138 136 - 145 mEq/L Final         Passed - Valid encounter within last 6 months    Recent Outpatient Visits          2 months ago Tachycardia   [facility] [place], MD   3 months ago Hyperglycemia   [facility] [place], MD   5 months ago Essential hypertension   [facility] [place], Ranell Patrick, MD   9 months ago Essential hypertension   [facility] [place], Ranell Patrick, MD   1 year ago Essential hypertension   [facility] [place], Ranell Patrick, MD

## 2018-09-19 ENCOUNTER — Other Ambulatory Visit: Payer: Self-pay | Admitting: Hematology

## 2018-09-19 DIAGNOSIS — Z170 Estrogen receptor positive status [ER+]: Secondary | ICD-10-CM

## 2018-09-19 DIAGNOSIS — C50411 Malignant neoplasm of upper-outer quadrant of right female breast: Secondary | ICD-10-CM

## 2018-09-24 ENCOUNTER — Other Ambulatory Visit: Payer: Self-pay | Admitting: Family Medicine

## 2018-10-16 ENCOUNTER — Encounter: Payer: Self-pay | Admitting: Family Medicine

## 2018-10-16 ENCOUNTER — Ambulatory Visit: Payer: Commercial Managed Care - PPO | Admitting: Family Medicine

## 2018-10-16 ENCOUNTER — Other Ambulatory Visit: Payer: Self-pay

## 2018-10-16 ENCOUNTER — Ambulatory Visit (INDEPENDENT_AMBULATORY_CARE_PROVIDER_SITE_OTHER): Payer: Commercial Managed Care - PPO

## 2018-10-16 VITALS — BP 143/99 | HR 122 | Temp 98.0°F | Resp 16 | Ht 68.0 in | Wt 199.8 lb

## 2018-10-16 DIAGNOSIS — M25511 Pain in right shoulder: Secondary | ICD-10-CM

## 2018-10-16 DIAGNOSIS — I10 Essential (primary) hypertension: Secondary | ICD-10-CM | POA: Diagnosis not present

## 2018-10-16 DIAGNOSIS — R000 Tachycardia, unspecified: Secondary | ICD-10-CM | POA: Diagnosis not present

## 2018-10-16 MED ORDER — HYDROCHLOROTHIAZIDE 12.5 MG PO CAPS: ORAL_CAPSULE | ORAL | 1 refills | Status: DC

## 2018-10-16 MED ORDER — AMLODIPINE BESYLATE 10 MG PO TABS: ORAL_TABLET | ORAL | 1 refills | Status: DC

## 2018-10-16 NOTE — Progress Notes (Signed)
Subjective:    Patient ID: Caitlin Monroe, female    DOB: 01-22-1966, 53 y.o.   MRN: [id number]  HPI Caitlin Monroe is a 53 y.o. female Presents today for: Chief Complaint  Patient presents with  . Hypertension    f/u  . Medication refill    HCTZ 12.5 mg   Hypertension: BP Readings from Last 3 Encounters:  10/16/18 (!) 143/99  06/29/18 (!) 140/96  06/16/18 (!) 141/107   Lab Results  Component Value Date   CREATININE 0.88 06/14/2018  Last evaluated November 5.  Home readings mostly in the 140s to 150s over 90s at that time.  Continue 10 mg of amlodipine, added HCTZ 12.5 mg daily.  Just left workout class.  Home readings: 123/89 [facility] 138/91 - had missed a few doses of HCTZ.  Out of HCTZ for a few weeks.   Wt Readings from Last 3 Encounters:  10/16/18 199 lb 12.8 oz (90.6 kg)  06/29/18 189 lb 3.2 oz (85.8 kg)  06/16/18 192 lb 4.8 oz (87.2 kg)  weight gain with tamoxifen.   Tachycardia: Has had elevated heart rate at previous visits, 101 last visit. Just left [employer] workout.  Thyroid testing ok.  No palpitations.  Has hr monitoring at workouts and fitbit. Resting HR in low 90's.  Some stressors.   Lab Results  Component Value Date   TSH 1.090 06/29/2018   Lab Results  Component Value Date   WBC 6.8 06/14/2018   HGB 13.3 06/14/2018   HCT 38.3 06/14/2018   MCV 94.8 06/14/2018   PLT 309 06/14/2018   R shoulder pain at time for years after swimming butterfly.  Notices pain/feels relative weakness, "not right" with leaning forward and pulling up weights.  Sometimes pops.  Initial injury 4-5 years ago - trouble lifting arm - never quite right since.  Tx: occasional advil, massage.    Patient Active Problem List   Diagnosis Date Noted  . Breast cancer, right ([place]) 05/27/2016  . Genetic testing 05/13/2016  . Invasive carcinoma of breast ([place]) 05/06/2016  . Family history of breast cancer   . Family history of prostate  cancer   . Breast cancer of upper-outer quadrant of right female breast ([place]) 04/25/2016  . Depression 06/27/2015  . Seasonal affective disorder ([place]) 06/27/2015  . Allergic rhinitis 04/05/2013  . Depression with anxiety 04/05/2013   Past Medical History:  Diagnosis Date  . Anxiety   . Breast cancer of upper-outer quadrant of right female breast ([place]) 04/25/2016  . Depression   . Family history of breast cancer   . Family history of prostate cancer   . Hypertension    "because of stress" (05/27/2016)  . PONV (postoperative nausea and vomiting)   . Seasonal allergies    Past Surgical History:  Procedure Laterality Date  . ABDOMINAL HYSTERECTOMY  Aug 2011   Da Vinci TAH for fibroids  . BREAST BIOPSY  03/2016  . FOOT NEUROMA SURGERY Right 2000?  Marland Kitchen MASTECTOMY COMPLETE / SIMPLE W/ SENTINEL NODE BIOPSY Right 05/27/2016  . MASTECTOMY W/ SENTINEL NODE BIOPSY Right 05/27/2016   Procedure: RIGHT TOTAL MASTECTOMY WITH AXILLARY SENTINEL LYMPH NODE BIOPSY;  Surgeon: Excell Seltzer, MD;  Location: [place];  Service: General;  Laterality: Right;  . TONSILLECTOMY    . WISDOM TOOTH EXTRACTION  ~ 1985   No Known Allergies Prior to Admission medications   Medication Sig Start Date End Date Taking? Authorizing Provider  amLODipine (NORVASC)  10 MG tablet TAKE 1 TABLET BY MOUTH EVERY DAY. 06/29/18  Yes [name], MD  amphetamine-dextroamphetamine (ADDERALL) 10 MG tablet 7.5 mg.  06/25/17  Yes [provider]  cephALEXin (KEFLEX) 500 MG capsule TK ONE C PO BID FOR 3 DAYS PRN OR 1 C PO AFTER INTERCOURSE 11/07/17  Yes [provider]  cholecalciferol (VITAMIN D) 1000 UNITS tablet Take 6,000 Units by mouth daily.    Yes [provider]  hydrochlorothiazide (MICROZIDE) 12.5 MG capsule TAKE 1 CAPSULE(12.5 MG) BY MOUTH DAILY 08/31/18  Yes [name], MD  ibuprofen (ADVIL,MOTRIN) 200 MG tablet Take 200 mg by mouth every 6 (six) hours as needed for mild pain.   Yes [provider]  Lactobacillus Rhamnosus, GG, (CULTURELLE PO) Take by mouth.   Yes [provider]  loratadine (CLARITIN) 10 MG tablet Take 10 mg by mouth daily.   Yes [provider]  Multiple Vitamins-Minerals (MULTIVITAMIN WITH MINERALS) tablet Take 1 tablet by mouth daily.   Yes [provider]  Omega-3 Fatty Acids (FISH OIL PO) Take 1 capsule by mouth daily.    Yes [provider]  tamoxifen (NOLVADEX) 20 MG tablet TAKE 1 TABLET BY MOUTH  DAILY 09/20/18  Yes [name], MD  valACYclovir (VALTREX) 500 MG tablet TK 1 T PO D 08/30/17  Yes [provider]  venlafaxine (EFFEXOR) 75 MG tablet Take 75 mg by mouth daily.   Yes [provider]  amLODipine (NORVASC) 5 MG tablet TAKE 1 TABLET BY MOUTH  DAILY Patient not taking: Reported on 10/16/2018 09/27/18   [name], MD  ASHWAGANDHA PO Take 1,000 mg by mouth. Takes 2 tabs nightly.    [provider]  magnesium 30 MG tablet Take 500 mg by mouth daily.     [provider]   Social History   Socioeconomic History  . Marital status: Legally Separated    Spouse name: Not on file  . Number of children: Not on file  . Years of education: Not on file  . Highest education level: Not on file  Occupational History  . Occupation: Education administrator  Social Needs  . Financial resource strain: Not on file  . Food insecurity:    Worry: Not on file    Inability: Not on file  . Transportation needs:    Medical: Not on file    Non-medical: Not on file  Tobacco Use  . Smoking status: Former Smoker    Packs/day: 0.50    Years: 15.00    Pack years: 7.50    Types: Cigarettes    Last attempt to quit: 06/23/1995    Years since quitting: 23.3  . Smokeless tobacco: Never Used  Substance and Sexual Activity  . Alcohol use: No    Alcohol/week: 0.0 standard drinks    Comment: 05/27/2016 "nothing since 1996"  . Drug use: No    Comment: 05/27/2016 "nothing since 1996"  . Sexual activity:  Yes  Lifestyle  . Physical activity:    Days per week: Not on file    Minutes per session: Not on file  . Stress: Not on file  Relationships  . Social connections:    Talks on phone: Not on file    Gets together: Not on file    Attends religious service: Not on file    Active member of club or organization: Not on file    Attends meetings of clubs or organizations: Not on file    Relationship  status: Not on file  . Intimate partner violence:    Fear of current or ex partner: Not on file    Emotionally abused: Not on file    Physically abused: Not on file    Forced sexual activity: Not on file  Other Topics Concern  . Not on file  Social History Narrative   Married. Education: [employer]. Exercise: Gym 2 times a week fit walking.                      Review of Systems Per HPI.     Objective:   Physical Exam Vitals signs reviewed.  Constitutional:      Appearance: She is well-developed.  HENT:     Head: Normocephalic and atraumatic.  Eyes:     Conjunctiva/sclera: Conjunctivae normal.     Pupils: Pupils are equal, round, and reactive to light.  Neck:     Vascular: No carotid bruit.  Cardiovascular:     Rate and Rhythm: Regular rhythm. Tachycardia present.     Heart sounds: Normal heart sounds.  Pulmonary:     Effort: Pulmonary effort is normal.     Breath sounds: Normal breath sounds.  Abdominal:     Palpations: Abdomen is soft. There is no pulsatile mass.     Tenderness: There is no abdominal tenderness.  Musculoskeletal:     Right shoulder: She exhibits tenderness (anterior shoulder at biciptial groove. ). She exhibits normal range of motion, no bony tenderness, no swelling and normal strength (full rtc strenght, neg empty can, neer. positive speeds).     Cervical back: She exhibits normal range of motion, no tenderness, no bony tenderness and no spasm.  Skin:    General: Skin is warm and dry.  Neurological:     Mental Status: She is alert and oriented to  person, place, and time.  Psychiatric:        Behavior: Behavior normal.    Vitals:   10/16/18 0826  BP: (!) 143/99  Pulse: (!) 122  Resp: 16  Temp: 98 F (36.7 C)  TempSrc: Oral  SpO2: 100%  Weight: 199 lb 12.8 oz (90.6 kg)  Height: 5\' 8"  (1.727 m)   EKG: sinus tach, rate 105. No acute findings.   Dg Shoulder Right  Result Date: 10/16/2018 CLINICAL DATA:  Right anterior shoulder pain for years. EXAM: RIGHT SHOULDER - 2+ VIEW COMPARISON:  None. FINDINGS: There is no evidence of fracture or dislocation. There is no evidence of arthropathy or other focal bone abnormality. Soft tissues are unremarkable. IMPRESSION: Negative. Electronically Signed   By: Fidela Salisbury M.D.   On: 10/16/2018 09:14       Assessment & Plan:   Caitlin Monroe is a 53 y.o. female Tachycardia - Plan: EKG 12-Lead  -Sinus tachycardia, no acute findings on EKG.  Previous TSH, CBC normal.  Advised to monitor home readings and let me know by MyChart.  Potential whitecoat component/situational anxiety component.  Essential hypertension - Plan: hydrochlorothiazide (MICROZIDE) 12.5 MG capsule, Basic metabolic panel, amLODipine (NORVASC) 10 MG tablet  -Decreased control off HCTZ, restart HCTZ prior to this, continue Norvasc same dose, check BMP, my chart update for home readings on meds.  Pain in joint of right shoulder - Plan: DG Shoulder Right, Ambulatory referral to Orthopedic Surgery  -Longstanding symptoms since possible injury 4 to 5 years ago, locates primarily to anterior shoulder.  Rotator cuff testing was reassuring including supraspinatus testing/empty can.  Slight tenderness over the bicipital  groove, differential includes bicipital tenosynovitis.  Reassuring imaging.  -Refer to orthopedics for further evaluation, may benefit from physical therapy versus bicipital injection, but will defer to that visit.  Meds ordered this encounter  Medications  . hydrochlorothiazide (MICROZIDE) 12.5 MG  capsule    Sig: TAKE 1 CAPSULE(12.5 MG) BY MOUTH DAILY    Dispense:  90 capsule    Refill:  1  . amLODipine (NORVASC) 10 MG tablet    Sig: TAKE 1 TABLET BY MOUTH EVERY DAY.    Dispense:  90 tablet    Refill:  1   Patient Instructions   Check your heart rate outside of the office and keep a record, send me an email in the next week or 2 with those readings.  Blood pressure should improve with restarting hydrochlorothiazide.  Keep an eye on those numbers as well and can send those readings in the email as well.  Shoulder pain may be due to biceps tendinitis or pain in the front of the shoulder, but will refer you to a shoulder specialist to evaluate that area further.  Return to the clinic or go to the nearest emergency room if any of your symptoms worsen or new symptoms occur.    If you have lab work done today you will be contacted with your lab results within the next 2 weeks.  If you have not heard from [place] then please contact us. The fastest way to get your results is to register for My Chart.   IF you received an x-ray today, you will receive an invoice from [facility]. Please contact [facility] at [phone] with questions or concerns regarding your invoice.   IF you received labwork today, you will receive an invoice from [place]. Please contact [facility] at [phone] with questions or concerns regarding your invoice.   Our billing staff will not be able to assist you with questions regarding bills from these companies.  You will be contacted with the lab results as soon as they are available. The fastest way to get your results is to activate your My Chart account. Instructions are located on the last page of this paperwork. If you have not heard from [place] regarding the results in 2 weeks, please contact this office.      Signed,   [name], MD [facility] at [place].  10/16/18 2:55 PM

## 2018-10-16 NOTE — Patient Instructions (Addendum)
Check your heart rate outside of the office and keep a record, send me an email in the next week or 2 with those readings.  Blood pressure should improve with restarting hydrochlorothiazide.  Keep an eye on those numbers as well and can send those readings in the email as well.  Shoulder pain may be due to biceps tendinitis or pain in the front of the shoulder, but will refer you to a shoulder specialist to evaluate that area further.  Return to the clinic or go to the nearest emergency room if any of your symptoms worsen or new symptoms occur.    If you have lab work done today you will be contacted with your lab results within the next 2 weeks.  If you have not heard from [place] then please contact us. The fastest way to get your results is to register for My Chart.   IF you received an x-ray today, you will receive an invoice from [facility]. Please contact [facility] at [phone] with questions or concerns regarding your invoice.   IF you received labwork today, you will receive an invoice from [facility]. Please contact [facility] at [phone] with questions or concerns regarding your invoice.   Our billing staff will not be able to assist you with questions regarding bills from these companies.  You will be contacted with the lab results as soon as they are available. The fastest way to get your results is to activate your My Chart account. Instructions are located on the last page of this paperwork. If you have not heard from [place] regarding the results in 2 weeks, please contact this office.

## 2018-10-17 LAB — BASIC METABOLIC PANEL
BUN/Creatinine Ratio: 14 (ref 9–23)
BUN: 15 mg/dL (ref 6–24)
CALCIUM: 9 mg/dL (ref 8.7–10.2)
CHLORIDE: 104 mmol/L (ref 96–106)
CO2: 22 mmol/L (ref 20–29)
Creatinine, Ser: 1.07 mg/dL — ABNORMAL HIGH (ref 0.57–1.00)
GFR calc Af Amer: 69 mL/min/{1.73_m2} (ref >59)
GFR, EST NON AFRICAN AMERICAN: 60 mL/min/{1.73_m2} (ref >59)
Glucose: 90 mg/dL (ref 65–99)
Potassium: 4.2 mmol/L (ref 3.5–5.2)
Sodium: 141 mmol/L (ref 134–144)

## 2018-10-29 ENCOUNTER — Other Ambulatory Visit: Payer: Self-pay | Admitting: Hematology

## 2018-11-01 ENCOUNTER — Ambulatory Visit
Admission: RE | Admit: 2018-11-01 | Discharge: 2018-11-01 | Disposition: A | Discharge status: Home / Self Care | Payer: Commercial Managed Care - PPO | Source: Ambulatory Visit | Attending: Hematology | Admitting: Hematology

## 2018-11-01 DIAGNOSIS — C50411 Malignant neoplasm of upper-outer quadrant of right female breast: Secondary | ICD-10-CM

## 2018-11-01 DIAGNOSIS — Z170 Estrogen receptor positive status [ER+]: Secondary | ICD-10-CM

## 2018-11-01 DIAGNOSIS — Z853 Personal history of malignant neoplasm of breast: Secondary | ICD-10-CM | POA: Diagnosis not present

## 2018-11-01 MED ORDER — GADOBUTROL 1 MMOL/ML IV SOLN
9.0000 mL | Freq: Once | INTRAVENOUS | Status: AC | PRN
  Administered 2018-11-01: 9 mL via INTRAVENOUS

## 2018-11-02 ENCOUNTER — Telehealth: Payer: Self-pay | Admitting: *Deleted

## 2018-11-02 NOTE — Telephone Encounter (Signed)
-----   Message from [name], MD sent at 11/02/2018  7:59 AM EDT ----- Please let pt know the MRI results, thanks   [name]  11/02/2018

## 2018-11-02 NOTE — Telephone Encounter (Signed)
Called pt to give good MRI report.  She states she has seen it on MyChart & is very relieved & appreciative of call.

## 2018-12-06 ENCOUNTER — Telehealth: Payer: Self-pay | Admitting: *Deleted

## 2018-12-06 NOTE — Telephone Encounter (Signed)
Medical records faxed to Release Point/ Underwriting Benefits Review Caitlin Monroe; [place] [phone]

## 2018-12-10 ENCOUNTER — Encounter: Payer: Self-pay | Admitting: Hematology

## 2018-12-15 ENCOUNTER — Telehealth: Payer: Self-pay | Admitting: Hematology

## 2018-12-15 NOTE — Telephone Encounter (Signed)
Spoke with patient and educated her on how to work [employer]. Sent email to [email or web address] with the join link.

## 2018-12-15 NOTE — Progress Notes (Signed)
[facility]   [phone] [phone]   Clinic Follow up Note   Patient Care Team: [name], MD as PCP - General (Family Medicine) Brien Few, MD as Consulting Physician (Obstetrics and Gynecology) Artelia Laroche, CNM as Midwife (Obstetrics and Gynecology) Excell Seltzer, MD as Consulting Physician (General Surgery) [name], MD as Consulting Physician (Hematology) Gery Pray, MD as Consulting Physician (Radiation Oncology)   I connected with Caitlin Monroe on 12/17/2018 at  8:30 AM EDT by video enabled telemedicine visit and verified that I am speaking with the correct person using two identifiers.  I discussed the limitations, risks, security and privacy concerns of performing an evaluation and management service by telephone and the availability of in person appointments. I also discussed with the patient that there may be a patient responsible charge related to this service. The patient expressed understanding and agreed to proceed.   Patient's location:  Her home Provider's location:  My office  CHIEF COMPLAINT: Follow up right breast cancer  SUMMARY OF ONCOLOGIC HISTORY: Oncology History   Breast cancer of upper-outer quadrant of right female breast [facility])   Staging form: Breast, AJCC 7th Edition   - Clinical stage from 04/23/2016: Stage IA (T1c(m), N0, M0) - Signed by [name], MD on 04/30/2016   - Pathologic stage from 05/27/2016: Stage IIA (T2(m), N0, cM0) - Unsigned      Breast cancer of upper-outer quadrant of right female breast ([place])   04/17/2016 Mammogram    Mammogram and US showed a 2.2cm cyst at 12:00, a 1.6cm lobulated mass at 9:00 and a 56m mass at 10:00, axilla was negative     04/23/2016 Initial Diagnosis    [place]    04/23/2016 Initial Biopsy    Core needle biopsy of right breast masses at 10:00 and 9:30 positions both showed invasive and insitu  mammary carcinoma     04/23/2016 Receptors her2    ER 90%+, PR 80-90%+, HER2-, KI67 10-15%    05/12/2016 Procedure    Genetic testing: Negative. Genes analyzed: ATM, BAP1, BARD1, BRCA1, BRCA2, BRIP1, CDH1, CDK4, CDKN2A, CHEK2, EPCAM, FANCC, MITF, MLH1, MSH2, MSH6, NBN, PALB2, PMS2, PTEN, RAD51C, RAD51D, TP53, and XRCC2    05/27/2016 Surgery    Right breast mastectomy and sentinel lymph node biopsy ([place])    05/27/2016 Pathology Results    Right breast mastectomy showed multifocal invasive and in situ lobular carcinoma, 3.2, 0.9 and 0.9 cm, G1 margins were negative, 3 sentinel lymph nodes were negative.     05/27/2016 Oncotype testing    RS 18, which predicts 10 year distant recurrence risk of 12% with tamoxifen (intermediate risk)     11/23/2016 -  Anti-estrogen oral therapy    Tamoxifen 20 mg daily, she started with '10mg'$  daily for the first month     04/27/2018 Mammogram    Benign      CURRENT THERAPY:  adjuvant Tamoxifen '20mg'$  daily, she started on 11/23/2016 and had '10mg'$  daily for the first month. Held starting 12/06/18 due to weight gain and uncontrolled HTN.   INTERVAL HISTORY:  Caitlin MACDONELLis here for a follow up of right breast cancer. She was able to identify herself by face-to-face video. She notes she is doing well and working from home. She only goes out to the store once a week.  She physically feels well and better since her benign MRI. She notes she has stopped Tamoxifen due to  weight gain of 20 pounds and increase of BP. She is on HCTZ and Amlodipine. She is also walking more and intermittently fasting. Once she controls her weight and HTN she plans to restart her Tamoxifen.  She is concerned about her risk for COVID-19.     REVIEW OF SYSTEMS:   Constitutional: Denies fevers, chills (+) weight gain  Eyes: Denies blurriness of vision Ears, nose, mouth, throat, and face: Denies mucositis or sore throat Respiratory: Denies cough, dyspnea or wheezes Cardiovascular:  Denies palpitation, chest discomfort or lower extremity swelling Gastrointestinal:  Denies nausea, heartburn or change in bowel habits Skin: Denies abnormal skin rashes Lymphatics: Denies new lymphadenopathy or easy bruising Neurological:Denies numbness, tingling or new weaknesses Behavioral/Psych: Mood is stable, no new changes  All other systems were reviewed with the patient and are negative.  MEDICAL HISTORY:  Past Medical History:  Diagnosis Date  . Anxiety   . Breast cancer of upper-outer quadrant of right female breast ([place]) 04/25/2016  . Depression   . Family history of breast cancer   . Family history of prostate cancer   . Hypertension    "because of stress" (05/27/2016)  . PONV (postoperative nausea and vomiting)   . Seasonal allergies     SURGICAL HISTORY: Past Surgical History:  Procedure Laterality Date  . ABDOMINAL HYSTERECTOMY  Aug 2011   [employer] Vinc[employer] TAH for fibroids  . BREAST BIOPSY  03/2016  . FOOT NEUROMA SURGERY Right 2000?  [facility] MASTECTOMY COMPLETE / SIMPLE W/ SENTINEL NODE BIOPSY Right 05/27/2016  . MASTECTOMY W/ SENTINEL NODE BIOPSY Right 05/27/2016   Procedure: RIGHT TOTAL MASTECTOMY WITH AXILLARY SENTINEL LYMPH NODE BIOPSY;  Surgeon: Excell Seltzer, MD;  Location: [facility];  Service: General;  Laterality: Right;  . TONSILLECTOMY    . WISDOM TOOTH EXTRACTION  ~ 1985    I have reviewed the social history and family history with the patient and they are unchanged from previous note.  ALLERGIES:  has No Known Allergies.  MEDICATIONS:  Current Outpatient Medications  Medication Sig Dispense Refill  . amLODipine (NORVASC) 10 MG tablet TAKE 1 TABLET BY MOUTH EVERY DAY. 90 tablet 1  . amphetamine-dextroamphetamine (ADDERALL) 10 MG tablet 7.5 mg.   0  . ASHWAGANDHA PO Take 1,000 mg by mouth. Takes 2 tabs nightly.    . cephALEXin (KEFLEX) 500 MG capsule TK ONE C PO BID FOR 3 DAYS PRN OR 1 C PO AFTER INTERCOURSE  11  . cholecalciferol (VITAMIN D) 1000 UNITS tablet  Take 6,000 Units by mouth daily.     . hydrochlorothiazide (MICROZIDE) 12.5 MG capsule TAKE 1 CAPSULE(12.5 MG) BY MOUTH DAILY 90 capsule 1  . ibuprofen (ADVIL,MOTRIN) 200 MG tablet Take 200 mg by mouth every 6 (six) hours as needed for mild pain.    . Lactobacillus Rhamnosus, GG, (CULTURELLE PO) Take by mouth.    . loratadine (CLARITIN) 10 MG tablet Take 10 mg by mouth daily.    . magnesium 30 MG tablet Take 500 mg by mouth daily.     . Multiple Vitamins-Minerals (MULTIVITAMIN WITH MINERALS) tablet Take 1 tablet by mouth daily.    . Omega-3 Fatty Acids (FISH OIL PO) Take 1 capsule by mouth daily.     . tamoxifen (NOLVADEX) 20 MG tablet TAKE 1 TABLET BY MOUTH  DAILY 90 tablet 3  . valACYclovir (VALTREX) 500 MG tablet TK 1 T PO D  4  . venlafaxine (EFFEXOR) 75 MG tablet Take 75 mg by mouth daily.  No current facility-administered medications for this visit.     PHYSICAL EXAMINATION: ECOG PERFORMANCE STATUS: 0 - Asymptomatic  No vitals taken today, Exam not performed today  LABORATORY DATA:  I have reviewed the data as listed CBC Latest Ref Rng & Units 06/14/2018 03/15/2018 10/26/2017  WBC 4.0 - 10.5 K/uL 6.8 6.3 7.8  Hemoglobin 12.0 - 15.0 g/dL 13.3 13.9 13.9  Hematocrit 36.0 - 46.0 % 38.3 40.0 40.1  Platelets 150 - 400 K/uL 309 308 334     CMP Latest Ref Rng & Units 10/16/2018 06/14/2018 05/07/2018  Glucose 65 - 99 mg/dL 90 100(H) 91  BUN 6 - 24 mg/dL _0 Creatinine 0.57 - 1.00 mg/dL 1.07(H) 0.88 0.88  Sodium 134 - 144 mmol/L 141 139 139  Potassium 3.5 - 5.2 mmol/L 4.2 3.8 4.2  Chloride 96 - 106 mmol/L 104 106 101  CO2 20 - 29 mmol/L _1 Calcium 8.7 - 10.2 mg/dL 9.0 9.0 9.1  Total Protein 6.5 - 8.1 g/dL - 7.2 -  Total Bilirubin 0.3 - 1.2 mg/dL - 0.4 -  Alkaline Phos 38 - 126 U/L - 43 -  AST 15 - 41 U/L - 19 -  ALT 0 - 44 U/L - 17 -      RADIOGRAPHIC STUDIES: I have personally reviewed the radiological images as listed and agreed with the findings in the  report. No results found.   ASSESSMENT & PLAN:  TAHJAE DURR is a 53 y.o. female with   1. Breast cancer of upper outer quadrant of right breast, invasive lobular carcinoma, mpT2N0M0, stage IIA, ER+/PR+/HER2-, G1, RS 18  -She was diagnosed in 03/2016. She was treated with a right mastectomy. She had multifocal disease, surgical margins were negative. Nodes were negative. -the Oncotype Dx result was intermedia risk based on the recurrence score, which predicts 10 year distant recurrence after 5 years of tamoxifen 12%. The benefit of chemotherapy in the intermedia risk group is small and controversial. Given her relatively low score in the intermedia group, I did not recommend adjuvant chemotherapy.  -Given she was node negative, post-mastectomy radiation was not recommended.  -She started anti-estrogen therapy with Tamoxifen in 11/23/2016. For the first month she used 62m daily. She is tolerating full dose 219mdaily well.   -Due to her increased weight gain and worsened HTN, she has held Tamoxifen (12/06/18) on her own, but willing to resume when HTN and weight is better controlled. Given these side effects this is understandable. WE discussed BP and weight management  -Overall she is clinically doing well. Her 04/2018 mammogram and her 10/2018 breast MRI were unremarkable. There is no clinical concern for recurrence.  -Next mammogram in 04/2019.  -I reviewed COVID-19 precautions and encouraged her to continue social distancing.  -f/u in 3 months with lab   2. Bone health -We previously discussed the effect on bone density from tamoxifen or aromatase inhibitor -I encouraged her to take calcium and vitamin D  3. Hypertension, depression, weight gain  -She has stopped Wellbutrin, and has stopped Zoloft -She is on Effexor. I previously recommend she also gets counseling.  -She father passed away in 09/2018/02/27nd has been grieving but not depressed. She is overall doing well.  -Her BP has worsened  lately. She is currently on low dose amlodipine and HCTZ. She is working to control her BP. I encouraged her to reduce salt in diet.  -She has gained about 20 pounds lately and is concerned with  obesity. She has been exercising more and intermittently fasting. I encouraged her to also reduce or cut carbs from her diet.  -Continue follow-up with her primary care physician, Dr. Carlota Raspberry  4. Recurrent UTI  -she is on prophylactic Keflex and Azo, and to sees urologist Dr. Tresa Moore  -Have much improved now. She is currently not complaining of urinary symptoms   Plan -Continue to hold Tamoxifen for now, and she will restart when her BP and weight gain are better controlled  -F/u and lab in 3 months    No problem-specific Assessment & Plan notes found for this encounter.   No orders of the defined types were placed in this encounter.  I discussed the assessment and treatment plan with the patient. The patient was provided an opportunity to ask questions and all were answered. The patient agreed with the plan and demonstrated an understanding of the instructions.  The patient was advised to call back or seek an in-person evaluation if the symptoms worsen or if the condition fails to improve as anticipated.  I provided 15 minutes of face-to-face video visit time during this encounter, and > 50% was spent counseling as documented under my assessment & plan.    [name], MD 12/17/2018   I, Joslyn Devon, am acting as scribe for [name], MD.   I have reviewed the above documentation for accuracy and completeness, and I agree with the above.

## 2018-12-17 ENCOUNTER — Other Ambulatory Visit: Payer: Commercial Managed Care - PPO

## 2018-12-17 ENCOUNTER — Encounter: Payer: Self-pay | Admitting: Hematology

## 2018-12-17 ENCOUNTER — Telehealth: Payer: Self-pay | Admitting: Hematology

## 2018-12-17 ENCOUNTER — Inpatient Hospital Stay: Payer: BLUE CROSS/BLUE SHIELD | Attending: Hematology | Admitting: Hematology

## 2018-12-17 DIAGNOSIS — Z79899 Other long term (current) drug therapy: Secondary | ICD-10-CM

## 2018-12-17 DIAGNOSIS — Z79810 Long term (current) use of selective estrogen receptor modulators (SERMs): Secondary | ICD-10-CM | POA: Diagnosis not present

## 2018-12-17 DIAGNOSIS — C50411 Malignant neoplasm of upper-outer quadrant of right female breast: Secondary | ICD-10-CM

## 2018-12-17 DIAGNOSIS — Z9011 Acquired absence of right breast and nipple: Secondary | ICD-10-CM

## 2018-12-17 DIAGNOSIS — N390 Urinary tract infection, site not specified: Secondary | ICD-10-CM

## 2018-12-17 DIAGNOSIS — Z90710 Acquired absence of both cervix and uterus: Secondary | ICD-10-CM

## 2018-12-17 DIAGNOSIS — I10 Essential (primary) hypertension: Secondary | ICD-10-CM

## 2018-12-17 DIAGNOSIS — Z170 Estrogen receptor positive status [ER+]: Secondary | ICD-10-CM

## 2018-12-17 DIAGNOSIS — F329 Major depressive disorder, single episode, unspecified: Secondary | ICD-10-CM

## 2018-12-17 NOTE — Telephone Encounter (Signed)
Scheduled appt per 4/24 los. ° °A calendar will be mailed out. °

## 2018-12-28 DIAGNOSIS — F408 Other phobic anxiety disorders: Secondary | ICD-10-CM | POA: Diagnosis not present

## 2018-12-28 DIAGNOSIS — F341 Dysthymic disorder: Secondary | ICD-10-CM | POA: Diagnosis not present

## 2019-01-01 ENCOUNTER — Other Ambulatory Visit: Payer: Self-pay | Admitting: Family Medicine

## 2019-01-21 DIAGNOSIS — N898 Other specified noninflammatory disorders of vagina: Secondary | ICD-10-CM | POA: Diagnosis not present

## 2019-01-21 DIAGNOSIS — Z01419 Encounter for gynecological examination (general) (routine) without abnormal findings: Secondary | ICD-10-CM | POA: Diagnosis not present

## 2019-02-04 ENCOUNTER — Other Ambulatory Visit: Payer: Self-pay | Admitting: *Deleted

## 2019-02-04 DIAGNOSIS — Z170 Estrogen receptor positive status [ER+]: Secondary | ICD-10-CM

## 2019-02-04 DIAGNOSIS — C50411 Malignant neoplasm of upper-outer quadrant of right female breast: Secondary | ICD-10-CM

## 2019-02-07 ENCOUNTER — Other Ambulatory Visit: Payer: Self-pay

## 2019-02-07 DIAGNOSIS — C50411 Malignant neoplasm of upper-outer quadrant of right female breast: Secondary | ICD-10-CM

## 2019-02-07 DIAGNOSIS — Z170 Estrogen receptor positive status [ER+]: Secondary | ICD-10-CM

## 2019-02-07 MED ORDER — TAMOXIFEN CITRATE 20 MG PO TABS: 20.0000 mg | ORAL_TABLET | Freq: Every day | ORAL | 3 refills | Status: DC

## 2019-02-14 ENCOUNTER — Other Ambulatory Visit: Payer: Self-pay | Admitting: Family Medicine

## 2019-02-14 DIAGNOSIS — I10 Essential (primary) hypertension: Secondary | ICD-10-CM

## 2019-02-15 DIAGNOSIS — N3020 Other chronic cystitis without hematuria: Secondary | ICD-10-CM | POA: Diagnosis not present

## 2019-03-11 NOTE — Progress Notes (Signed)
[name]   [phone] [phone]   Clinic Follow up Note   Patient Care Team: [name], MD as PCP - General (Family Medicine) Brien Few, MD as Consulting Physician (Obstetrics and Gynecology) Artelia Laroche, CNM as Midwife (Obstetrics and Gynecology) Excell Seltzer, MD as Consulting Physician (General Surgery) [name], MD as Consulting Physician (Hematology) Gery Pray, MD as Consulting Physician (Radiation Oncology)  Date of Service:  03/18/2019  CHIEF COMPLAINT: Follow up right breast cancer  SUMMARY OF ONCOLOGIC HISTORY: Oncology History Overview Note  Breast cancer of upper-outer quadrant of right female breast [facility])   Staging form: Breast, AJCC 7th Edition   - Clinical stage from 04/23/2016: Stage IA (T1c(m), N0, M0) - Signed by [name], MD on 04/30/2016   - Pathologic stage from 05/27/2016: Stage IIA (T2(m), N0, cM0) - Unsigned    Breast cancer of upper-outer quadrant of right female breast ([place])  04/17/2016 Mammogram   Mammogram and US showed a 2.2cm cyst at 12:00, a 1.6cm lobulated mass at 9:00 and a 16m mass at 10:00, axilla was negative    04/23/2016 Initial Diagnosis   Breast cancer of upper-outer quadrant of right female breast ([place]   04/23/2016 Initial Biopsy   Core needle biopsy of right breast masses at 10:00 and 9:30 positions both showed invasive and insitu mammary carcinoma    04/23/2016 Receptors her2   ER 90%+, PR 80-90%+, HER2-, KI67 10-15%   05/12/2016 Procedure   Genetic testing: Negative. Genes analyzed: ATM, BAP1, BARD1, BRCA1, BRCA2, BRIP1, CDH1, CDK4, CDKN2A, CHEK2, EPCAM, FANCC, MITF, MLH1, MSH2, MSH6, NBN, PALB2, PMS2, PTEN, RAD51C, RAD51D, TP53, and XRCC2   05/27/2016 Surgery   Right breast mastectomy and sentinel lymph node biopsy ([place])   05/27/2016 Pathology Results   Right breast mastectomy showed multifocal invasive and in situ lobular carcinoma, 3.2, 0.9 and 0.9 cm, G1 margins  were negative, 3 sentinel lymph nodes were negative.    05/27/2016 Oncotype testing   RS 18, which predicts 10 year distant recurrence risk of 12% with tamoxifen (intermediate risk)    11/23/2016 -  Anti-estrogen oral therapy   Tamoxifen 20 mg daily, she started with 19mdaily for the first month    04/27/2018 Mammogram   Benign      CURRENT THERAPY:  adjuvant Tamoxifen 2070maily, she started on 11/23/2016 and had 51m55mily for the first month. Held starting 12/06/18 due to weight gain and uncontrolled HTN. Due to no change she restarted in 12/2018.   INTERVAL HISTORY:  Caitlin FRISONhere for a follow up right breast cancer. She presents to the clinic alone. She notes she is working from home but still has to go [facility] [employer]. She continues her precautions.  She notes she held Tamoxifen for 4 weeks but did not notice any changed so she restarted in 12/2018.  123/88 has been her range at home. She notes she continues to work on losing weight with better diet and exercise. She notes no joint pain and her hot flashes are not noticeable. She notes she has a standing prescription for keflex as needed. She has been able to manage this.  She notes she no longer has periods but still has PMS symptoms. She keeps track of that.  She notes she has POA paperwork she wont's the clinic to have on file and will bring to her next visit.    REVIEW OF SYSTEMS:   Constitutional: Denies fevers, chills (+) Purposeful weight  loss (+) minimal hot flashes  Eyes: Denies blurriness of vision Ears, nose, mouth, throat, and face: Denies mucositis or sore throat Respiratory: Denies cough, dyspnea or wheezes Cardiovascular: Denies palpitation, chest discomfort or lower extremity swelling Gastrointestinal:  Denies nausea, heartburn or change in bowel habits Skin: Denies abnormal skin rashes Lymphatics: Denies new lymphadenopathy or easy bruising Neurological:Denies numbness, tingling or new weaknesses  Behavioral/Psych: Mood is stable, no new changes  All other systems were reviewed with the patient and are negative.  MEDICAL HISTORY:  Past Medical History:  Diagnosis Date  . Anxiety   . Breast cancer of upper-outer quadrant of right female breast ([place]) 04/25/2016  . Depression   . Family history of breast cancer   . Family history of prostate cancer   . Hypertension    "because of stress" (05/27/2016)  . PONV (postoperative nausea and vomiting)   . Seasonal allergies     SURGICAL HISTORY: Past Surgical History:  Procedure Laterality Date  . ABDOMINAL HYSTERECTOMY  Aug 2011   [employer] TAH for fibroids  . BREAST BIOPSY  03/2016  . FOOT NEUROMA SURGERY Right 2000?  [facility] MASTECTOMY COMPLETE / SIMPLE W/ SENTINEL NODE BIOPSY Right 05/27/2016  . MASTECTOMY W/ SENTINEL NODE BIOPSY Right 05/27/2016   Procedure: RIGHT TOTAL MASTECTOMY WITH AXILLARY SENTINEL LYMPH NODE BIOPSY;  Surgeon: Excell Seltzer, MD;  Location: [place];  Service: General;  Laterality: Right;  . TONSILLECTOMY    . WISDOM TOOTH EXTRACTION  ~ 1985    I have reviewed the social history and family history with the patient and they are unchanged from previous note.  ALLERGIES:  has No Known Allergies.  MEDICATIONS:  Current Outpatient Medications  Medication Sig Dispense Refill  . amLODipine (NORVASC) 10 MG tablet TAKE 1 TABLET BY MOUTH EVERY DAY. 90 tablet 1  . amphetamine-dextroamphetamine (ADDERALL) 10 MG tablet 7.5 mg.   0  . ASHWAGANDHA PO Take 1,000 mg by mouth. Takes 2 tabs nightly.    . cephALEXin (KEFLEX) 500 MG capsule TK ONE C PO BID FOR 3 DAYS PRN OR 1 C PO AFTER INTERCOURSE  11  . cholecalciferol (VITAMIN D) 1000 UNITS tablet Take 6,000 Units by mouth daily.     . hydrochlorothiazide (MICROZIDE) 12.5 MG capsule TAKE 1 CAPSULE(12.5 MG) BY MOUTH DAILY 30 capsule 1  . ibuprofen (ADVIL,MOTRIN) 200 MG tablet Take 200 mg by mouth every 6 (six) hours as needed for mild pain.    . Lactobacillus Rhamnosus, GG,  (CULTURELLE PO) Take by mouth.    . loratadine (CLARITIN) 10 MG tablet Take 10 mg by mouth daily.    . magnesium 30 MG tablet Take 500 mg by mouth daily.     . Multiple Vitamins-Minerals (MULTIVITAMIN WITH MINERALS) tablet Take 1 tablet by mouth daily.    . Omega-3 Fatty Acids (FISH OIL PO) Take 1 capsule by mouth daily.     . tamoxifen (NOLVADEX) 20 MG tablet Take 1 tablet (20 mg total) by mouth daily. 90 tablet 3  . valACYclovir (VALTREX) 500 MG tablet TK 1 T PO D  4  . venlafaxine (EFFEXOR) 75 MG tablet Take 75 mg by mouth daily.     No current facility-administered medications for this visit.     PHYSICAL EXAMINATION: ECOG PERFORMANCE STATUS: 0 - Asymptomatic  Vitals:   03/18/19 0838  BP: (!) 140/98  Pulse: 94  Resp: 17  Temp: 97.8 F (36.6 C)  SpO2: 99%   Filed Weights   03/18/19 0838  Weight:  194 lb (88 kg)    GENERAL:alert, no distress and comfortable SKIN: skin color, texture, turgor are normal, no rashes or significant lesions EYES: normal, Conjunctiva are pink and non-injected, sclera clear  NECK: supple, thyroid normal size, non-tender, without nodularity LYMPH:  no palpable lymphadenopathy in the cervical, axillary  LUNGS: clear to auscultation and percussion with normal breathing effort HEART: regular rate & rhythm and no murmurs and no lower extremity edema ABDOMEN:abdomen soft, non-tender and normal bowel sounds Musculoskeletal:no cyanosis of digits and no clubbing  NEURO: alert & oriented x 3 with fluent speech, no focal motor/sensory deficits BREAST: S/p right mastectomy: Surgical incision healed well. No palpable mass, nodules or adenopathy bilaterally. Breast exam benign.   LABORATORY DATA:  I have reviewed the data as listed CBC Latest Ref Rng & Units 03/18/2019 06/14/2018 03/15/2018  WBC 4.0 - 10.5 K/uL 6.4 6.8 6.3  Hemoglobin 12.0 - 15.0 g/dL 13.6 13.3 13.9  Hematocrit 36.0 - 46.0 % 39.1 38.3 40.0  Platelets 150 - 400 K/uL 307 309 308     CMP  Latest Ref Rng & Units 03/18/2019 10/16/2018 06/14/2018  Glucose 70 - 99 mg/dL 109(H) 90 100(H)  BUN 6 - 20 mg/dL '16 15 13  ' Creatinine 0.44 - 1.00 mg/dL 0.87 1.07(H) 0.88  Sodium 135 - 145 mmol/L 138 141 139  Potassium 3.5 - 5.1 mmol/L 4.3 4.2 3.8  Chloride 98 - 111 mmol/L 107 104 106  CO2 22 - 32 mmol/L 21(L) 22 22  Calcium 8.9 - 10.3 mg/dL 9.0 9.0 9.0  Total Protein 6.5 - 8.1 g/dL 6.9 - 7.2  Total Bilirubin 0.3 - 1.2 mg/dL 0.3 - 0.4  Alkaline Phos 38 - 126 U/L 37(L) - 43  AST 15 - 41 U/L 18 - 19  ALT 0 - 44 U/L 18 - 17      RADIOGRAPHIC STUDIES: I have personally reviewed the radiological images as listed and agreed with the findings in the report. No results found.   ASSESSMENT & PLAN:  Caitlin Monroe is a 54 y.o. female with   1. Breast cancer of upper outer quadrant of right breast, invasive lobular carcinoma, mpT2N0M0, stage IIA, ER+/PR+/HER2-, G1, RS 18  -She was diagnosed in 03/2016. She was treated with a right mastectomy. She had multifocal disease, surgical margins were negative. Nodes were negative. -The Oncotype Dx result was intermedia risk based on the recurrence score, which predicts 10 year distant recurrence after 5 years of tamoxifen 12%. The benefit of chemotherapy in the intermedia risk group is small and controversial. Given her relatively low score in the intermedia group, I did not recommend adjuvant chemotherapy.  -Given she was node negative, post-mastectomy radiation was not recommended.  -She started anti-estrogen therapy with Tamoxifen in 11/23/2016. For the first month she used 32m daily. She is tolerating full dose 280mdaily well.She held Tamoxifen only for 4 weeks in 11/2018 due to her recent weight gain and worsened HTN. No change occurred so she restarted. She continues to tolerate well with minimal hot flashes.  -Her 05/2018 TSH and Estradial showed she was still in ovulation phase although she is not having periods currently.  -She is clinically  doing well. Lab reviewed, her CBC and CMP are within normal limits. Her physical exam and her 10/2018 Breast MRI were unremarkable. There is no clinical concern for recurrence. -Continue Surveillance. Mammogram in 04/2019. Breast MRI in 10/2019.  -For her age appropriate cancer screenings, she had colonoscopy in 03/2018. Due to benign polyps removed  she will repeat in 4 years.  -F/u in 6 months   2. Bone health -We previously discussed the effect on bone density from tamoxifen or aromatase inhibitor -I encouraged her to take calcium and vitamin D  3. Hypertension, depression, weight gain  -She has stopped Wellbutrin, and has stopped Zoloft -She is on Effexor. I previously recommend she also gets counseling. -She father passed away in 2018/10/06 and has been grieving but not depressed. She is overall doing well.  -Her BP has worsened lately. She is currently on low dose amlodipine and HCTZ. She is working to control her BP with her PCP. 140/98 today (03/18/19) -After her 10-20 pounds weight gain this year she has been working to reduce her weight. She has lost a few pounds since last visit. I encouraged her to continue  -Continue follow-up with her primary care physician, Dr. Carlota Raspberry  4. Recurrent UTI  -Well managed with Keflex and Azo as needed, and to sees urologist Dr. Tresa Moore -She is currently not complaining of urinary symptoms    Plan -She is clinically doing well  -Mammogram in 04/2019 at [facility] and f/u in 6 months   No problem-specific Assessment & Plan notes found for this encounter.   Orders Placed This Encounter  Procedures  . MM DIAG BREAST TOMO UNI LEFT    Standing Status:   Future    Standing Expiration Date:   03/17/2020    Scheduling Instructions:     [place]    Order Specific Question:   Reason for Exam (SYMPTOM  OR DIAGNOSIS REQUIRED)    Answer:   screening    Order Specific Question:   Is the patient pregnant?    Answer:   No    Order Specific Question:    Preferred imaging location?    Answer:   External   All questions were answered. The patient knows to call the clinic with any problems, questions or concerns. No barriers to learning was detected. I spent 15 minutes counseling the patient face to face. The total time spent in the appointment was 20 minutes and more than 50% was on counseling and review of test results     [name], MD 03/18/2019   I, Joslyn Devon, am acting as scribe for [name], MD.   I have reviewed the above documentation for accuracy and completeness, and I agree with the above.

## 2019-03-16 DIAGNOSIS — F4323 Adjustment disorder with mixed anxiety and depressed mood: Secondary | ICD-10-CM | POA: Diagnosis not present

## 2019-03-18 ENCOUNTER — Encounter: Payer: Self-pay | Admitting: Hematology

## 2019-03-18 ENCOUNTER — Telehealth: Payer: Self-pay | Admitting: Hematology

## 2019-03-18 ENCOUNTER — Other Ambulatory Visit: Payer: Self-pay

## 2019-03-18 ENCOUNTER — Inpatient Hospital Stay: Payer: BC Managed Care – PPO | Attending: Hematology

## 2019-03-18 ENCOUNTER — Inpatient Hospital Stay (HOSPITAL_BASED_OUTPATIENT_CLINIC_OR_DEPARTMENT_OTHER): Payer: BC Managed Care – PPO | Admitting: Hematology

## 2019-03-18 VITALS — BP 140/98 | HR 94 | Temp 97.8°F | Resp 17 | Ht 68.0 in | Wt 194.0 lb

## 2019-03-18 DIAGNOSIS — Z170 Estrogen receptor positive status [ER+]: Secondary | ICD-10-CM

## 2019-03-18 DIAGNOSIS — F329 Major depressive disorder, single episode, unspecified: Secondary | ICD-10-CM | POA: Diagnosis not present

## 2019-03-18 DIAGNOSIS — Z9011 Acquired absence of right breast and nipple: Secondary | ICD-10-CM | POA: Insufficient documentation

## 2019-03-18 DIAGNOSIS — C50411 Malignant neoplasm of upper-outer quadrant of right female breast: Secondary | ICD-10-CM

## 2019-03-18 DIAGNOSIS — F32A Depression, unspecified: Secondary | ICD-10-CM

## 2019-03-18 DIAGNOSIS — I10 Essential (primary) hypertension: Secondary | ICD-10-CM

## 2019-03-18 DIAGNOSIS — R635 Abnormal weight gain: Secondary | ICD-10-CM

## 2019-03-18 LAB — COMPREHENSIVE METABOLIC PANEL
ALT: 18 U/L (ref 0–44)
AST: 18 U/L (ref 15–41)
Albumin: 4.2 g/dL (ref 3.5–5.0)
Alkaline Phosphatase: 37 U/L — ABNORMAL LOW (ref 38–126)
Anion gap: 10 (ref 5–15)
BUN: 16 mg/dL (ref 6–20)
CO2: 21 mmol/L — ABNORMAL LOW (ref 22–32)
Calcium: 9 mg/dL (ref 8.9–10.3)
Chloride: 107 mmol/L (ref 98–111)
Creatinine, Ser: 0.87 mg/dL (ref 0.44–1.00)
GFR calc Af Amer: >60 mL/min (ref >60)
GFR calc non Af Amer: >60 mL/min (ref >60)
Glucose, Bld: 109 mg/dL — ABNORMAL HIGH (ref 70–99)
Potassium: 4.3 mmol/L (ref 3.5–5.1)
Sodium: 138 mmol/L (ref 135–145)
Total Bilirubin: 0.3 mg/dL (ref 0.3–1.2)
Total Protein: 6.9 g/dL (ref 6.5–8.1)

## 2019-03-18 LAB — CBC WITH DIFFERENTIAL/PLATELET
Abs Immature Granulocytes: 0.01 10*3/uL (ref 0.00–0.07)
Basophils Absolute: 0 10*3/uL (ref 0.0–0.1)
Basophils Relative: 0 %
Eosinophils Absolute: 0.2 10*3/uL (ref 0.0–0.5)
Eosinophils Relative: 3 %
HCT: 39.1 % (ref 36.0–46.0)
Hemoglobin: 13.6 g/dL (ref 12.0–15.0)
Immature Granulocytes: 0 %
Lymphocytes Relative: 37 %
Lymphs Abs: 2.4 10*3/uL (ref 0.7–4.0)
MCH: 32.9 pg (ref 26.0–34.0)
MCHC: 34.8 g/dL (ref 30.0–36.0)
MCV: 94.4 fL (ref 80.0–100.0)
Monocytes Absolute: 0.4 10*3/uL (ref 0.1–1.0)
Monocytes Relative: 7 %
Neutro Abs: 3.4 10*3/uL (ref 1.7–7.7)
Neutrophils Relative %: 53 %
Platelets: 307 10*3/uL (ref 150–400)
RBC: 4.14 MIL/uL (ref 3.87–5.11)
RDW: 12.1 % (ref 11.5–15.5)
WBC: 6.4 10*3/uL (ref 4.0–10.5)
nRBC: 0 % (ref 0.0–0.2)

## 2019-03-18 NOTE — Telephone Encounter (Signed)
Scheduled appt per 7/24 los. ° °Printed and mailed appt calendar °

## 2019-03-25 ENCOUNTER — Other Ambulatory Visit: Payer: Self-pay | Admitting: Family Medicine

## 2019-03-25 DIAGNOSIS — I10 Essential (primary) hypertension: Secondary | ICD-10-CM

## 2019-03-25 NOTE — Telephone Encounter (Signed)
Forwarding medication refill request to clinical pool for review. 

## 2019-03-25 NOTE — Telephone Encounter (Signed)
Refilled medication for 30 days, 0 refills.

## 2019-03-29 DIAGNOSIS — F408 Other phobic anxiety disorders: Secondary | ICD-10-CM | POA: Diagnosis not present

## 2019-03-29 DIAGNOSIS — F341 Dysthymic disorder: Secondary | ICD-10-CM | POA: Diagnosis not present

## 2019-04-19 ENCOUNTER — Ambulatory Visit: Payer: Commercial Managed Care - PPO | Admitting: Family Medicine

## 2019-04-20 ENCOUNTER — Telehealth: Payer: Self-pay

## 2019-04-20 ENCOUNTER — Ambulatory Visit: Payer: BC Managed Care – PPO | Admitting: Family Medicine

## 2019-04-20 NOTE — Telephone Encounter (Signed)
Faxed signed order back to [employer], sent to HIM for scanning to chart.

## 2019-04-22 DIAGNOSIS — F4323 Adjustment disorder with mixed anxiety and depressed mood: Secondary | ICD-10-CM | POA: Diagnosis not present

## 2019-04-26 ENCOUNTER — Other Ambulatory Visit: Payer: Self-pay | Admitting: Family Medicine

## 2019-04-26 DIAGNOSIS — I10 Essential (primary) hypertension: Secondary | ICD-10-CM

## 2019-04-26 NOTE — Telephone Encounter (Signed)
Requested medication (s) are due for refill today:  yes  Requested medication (s) are on the active medication list: yes  Last refill:    Future visit scheduled: yes  Notes to clinic:  Review for refill   Requested Prescriptions  Pending Prescriptions Disp Refills   amLODipine (NORVASC) 10 MG tablet [Pharmacy Med Name: Amlodipine Besylate 10mg  Tablet] 90 tablet 1    Sig: Take 1 tablet by mouth daily.     Cardiovascular:  Calcium Channel Blockers Failed - 04/26/2019  6:47 AM      Failed - Last BP in normal range    BP Readings from Last 1 Encounters:  03/18/19 (!) 140/98         Failed - Valid encounter within last 6 months    Recent Outpatient Visits          6 months ago Tachycardia   [facility] [place], MD   10 months ago Tachycardia   [facility] [place], Ranell Patrick, MD   11 months ago Hyperglycemia   [facility] [place], Ranell Patrick, MD   1 year ago Essential hypertension   [facility] [place], Ranell Patrick, MD   1 year ago Essential hypertension   [facility] [place], Ranell Patrick, MD      Future Appointments            Tomorrow [name], MD [facility] [place], [facility]            hydrochlorothiazide (MICROZIDE) 12.5 MG capsule [Pharmacy Med Name: Hydrochlorothiazide 12.5mg  Capsule] 30 capsule 0    Sig: Take 1 capsule by mouth daily.     Cardiovascular: Diuretics - Thiazide Failed - 04/26/2019  6:47 AM      Failed - Last BP in normal range    BP Readings from Last 1 Encounters:  03/18/19 (!) 140/98         Failed - Valid encounter within last 6 months    Recent Outpatient Visits          6 months ago Tachycardia   [facility] [place], Ranell Patrick, MD   10 months ago Tachycardia   [facility] [place], Ranell Patrick, MD   11 months ago Hyperglycemia   [facility] [place], Ranell Patrick, MD   1 year ago Essential hypertension   [facility] [place], Ranell Patrick, MD    1 year ago Essential hypertension   [facility] [place], Ranell Patrick, MD      Future Appointments            Tomorrow [name], MD [facility] [place], [place] in normal range and within 360 days    Calcium  Date Value Ref Range Status  03/18/2019 9.0 8.9 - 10.3 mg/dL Final  04/16/2017 8.9 8.4 - 10.4 mg/dL Final         Passed - Cr in normal range and within 360 days    Creatinine  Date Value Ref Range Status  04/16/2017 0.8 0.6 - 1.1 mg/dL Final   Creatinine, Ser  Date Value Ref Range Status  03/18/2019 0.87 0.44 - 1.00 mg/dL Final         Passed - K in normal range and within 360 days    Potassium  Date Value Ref Range Status  03/18/2019 4.3 3.5 -  5.1 mmol/L Final  04/16/2017 4.5 3.5 - 5.1 mEq/L Final         Passed - Na in normal range and within 360 days    Sodium  Date Value Ref Range Status  03/18/2019 138 135 - 145 mmol/L Final  10/16/2018 141 134 - 144 mmol/L Final  04/16/2017 138 136 - 145 mEq/L Final

## 2019-04-27 ENCOUNTER — Encounter: Payer: Self-pay | Admitting: Family Medicine

## 2019-04-27 ENCOUNTER — Other Ambulatory Visit: Payer: Self-pay

## 2019-04-27 ENCOUNTER — Ambulatory Visit (INDEPENDENT_AMBULATORY_CARE_PROVIDER_SITE_OTHER): Payer: BC Managed Care – PPO | Admitting: Family Medicine

## 2019-04-27 DIAGNOSIS — I10 Essential (primary) hypertension: Secondary | ICD-10-CM

## 2019-04-27 MED ORDER — AMLODIPINE BESYLATE 10 MG PO TABS: ORAL_TABLET | ORAL | 1 refills | Status: DC

## 2019-04-27 MED ORDER — HYDROCHLOROTHIAZIDE 12.5 MG PO CAPS: ORAL_CAPSULE | ORAL | 1 refills | Status: DC

## 2019-04-27 NOTE — Patient Instructions (Addendum)
   No change in medications.  Numbers look good today as well as her kidney function on recent blood work.  Follow-up in 6 months for repeat evaluation.  Let me know if there are questions in the meantime. Thank you for coming in today.   If you have lab work done today you will be contacted with your lab results within the next 2 weeks.  If you have not heard from [place] then please contact us. The fastest way to get your results is to register for My Chart.   IF you received an x-ray today, you will receive an invoice from [facility]. Please contact [facility] at [phone] with questions or concerns regarding your invoice.   IF you received labwork today, you will receive an invoice from [place]. Please contact [facility] at [phone] with questions or concerns regarding your invoice.   Our billing staff will not be able to assist you with questions regarding bills from these companies.  You will be contacted with the lab results as soon as they are available. The fastest way to get your results is to activate your My Chart account. Instructions are located on the last page of this paperwork. If you have not heard from [place] regarding the results in 2 weeks, please contact this office.

## 2019-04-27 NOTE — Telephone Encounter (Signed)
Pt has appt with greene today at 4:20pm. We will let greene refill at visit.

## 2019-04-27 NOTE — Progress Notes (Signed)
Subjective:    Patient ID: Caitlin Monroe, female    DOB: 09/10/1965, 53 y.o.   MRN: [id number]  HPI Caitlin Monroe is a 53 y.o. female Presents today for: Chief Complaint  Patient presents with  . Hypertension     6 month f/u for hypertension.   . Hypertension: BP Readings from Last 3 Encounters:  04/27/19 132/81  03/18/19 (!) 140/98  10/16/18 (!) 143/99   Lab Results  Component Value Date   CREATININE 0.87 03/18/2019  Last evaluated in February.  Hypertension with tachycardia at that time. Restarted HCTZ 12.5  continued amlodipine 10 mg daily.  Previous TSH, CBC okay.  EKG was sinus tachycardia only previously.  Possible situational anxiety/whitecoat component. Home readings:121/88, 106/88, 116/82.   No new side effects with meds.  Still on tamoxifen. Followed by Dr Burr Medico.    Patient Active Problem List   Diagnosis Date Noted  . Breast cancer, right ([place]) 05/27/2016  . Genetic testing 05/13/2016  . Invasive carcinoma of breast ([place]) 05/06/2016  . Family history of breast cancer   . Family history of prostate cancer   . Breast cancer of upper-outer quadrant of right female breast ([place]) 04/25/2016  . Depression 06/27/2015  . Seasonal affective disorder ([place]) 06/27/2015  . Allergic rhinitis 04/05/2013  . Depression with anxiety 04/05/2013   Past Medical History:  Diagnosis Date  . Anxiety   . Breast cancer of upper-outer quadrant of right female breast ([place]) 04/25/2016  . Depression   . Family history of breast cancer   . Family history of prostate cancer   . Hypertension    "because of stress" (05/27/2016)  . PONV (postoperative nausea and vomiting)   . Seasonal allergies    Past Surgical History:  Procedure Laterality Date  . ABDOMINAL HYSTERECTOMY  Aug 2011   Da Vinci TAH for fibroids  . BREAST BIOPSY  03/2016  . FOOT NEUROMA SURGERY Right 2000?  Marland Kitchen MASTECTOMY COMPLETE / SIMPLE W/ SENTINEL NODE BIOPSY Right 05/27/2016  . MASTECTOMY W/ SENTINEL NODE  BIOPSY Right 05/27/2016   Procedure: RIGHT TOTAL MASTECTOMY WITH AXILLARY SENTINEL LYMPH NODE BIOPSY;  Surgeon: Excell Seltzer, MD;  Location: [facility];  Service: General;  Laterality: Right;  . TONSILLECTOMY    . WISDOM TOOTH EXTRACTION  ~ 1985   No Known Allergies Prior to Admission medications   Medication Sig Start Date End Date Taking? Authorizing Provider  amLODipine (NORVASC) 10 MG tablet TAKE 1 TABLET BY MOUTH EVERY DAY. 10/16/18  Yes [name], MD  amphetamine-dextroamphetamine (ADDERALL) 10 MG tablet 7.5 mg.  06/25/17  Yes [provider]  cephALEXin (KEFLEX) 500 MG capsule TK ONE C PO BID FOR 3 DAYS PRN OR 1 C PO AFTER INTERCOURSE 11/07/17  Yes [provider]  cholecalciferol (VITAMIN D) 1000 UNITS tablet Take 6,000 Units by mouth daily.    Yes [provider]  hydrochlorothiazide (MICROZIDE) 12.5 MG capsule Take 1 capsule by mouth daily. 03/25/19  Yes [name], MD  ibuprofen (ADVIL,MOTRIN) 200 MG tablet Take 200 mg by mouth every 6 (six) hours as needed for mild pain.   Yes [provider]  Lactobacillus Rhamnosus, GG, (CULTURELLE PO) Take by mouth.   Yes [provider]  loratadine (CLARITIN) 10 MG tablet Take 10 mg by mouth daily.   Yes [provider]  magnesium 30 MG tablet Take 500 mg by mouth daily.    Yes [provider]  Multiple Vitamins-Minerals (MULTIVITAMIN WITH MINERALS) tablet Take  1 tablet by mouth daily.   Yes [provider]  Omega-3 Fatty Acids (FISH OIL PO) Take 1 capsule by mouth daily.    Yes [provider]  tamoxifen (NOLVADEX) 20 MG tablet Take 1 tablet (20 mg total) by mouth daily. 02/07/19  Yes [name], MD  valACYclovir (VALTREX) 500 MG tablet TK 1 T PO D 08/30/17  Yes [provider]  venlafaxine (EFFEXOR) 75 MG tablet Take 75 mg by mouth daily.   Yes [provider]   Social History   Socioeconomic History  . Marital status: Legally Separated     Spouse name: Not on file  . Number of children: Not on file  . Years of education: Not on file  . Highest education level: Not on file  Occupational History  . Occupation: Education administrator  Social Needs  . Financial resource strain: Not on file  . Food insecurity    Worry: Not on file    Inability: Not on file  . Transportation needs    Medical: Not on file    Non-medical: Not on file  Tobacco Use  . Smoking status: Former Smoker    Packs/day: 0.50    Years: 15.00    Pack years: 7.50    Types: Cigarettes    Quit date: 06/23/1995    Years since quitting: 23.8  . Smokeless tobacco: Never Used  Substance and Sexual Activity  . Alcohol use: No    Alcohol/week: 0.0 standard drinks    Comment: 05/27/2016 "nothing since 1996"  . Drug use: No    Comment: 05/27/2016 "nothing since 1996"  . Sexual activity: Yes  Lifestyle  . Physical activity    Days per week: Not on file    Minutes per session: Not on file  . Stress: Not on file  Relationships  . Social Herbalist on phone: Not on file    Gets together: Not on file    Attends religious service: Not on file    Active member of club or organization: Not on file    Attends meetings of clubs or organizations: Not on file    Relationship status: Not on file  . Intimate partner violence    Fear of current or ex partner: Not on file    Emotionally abused: Not on file    Physically abused: Not on file    Forced sexual activity: Not on file  Other Topics Concern  . Not on file  Social History Narrative   Married. Education: [employer]. Exercise: Gym 2 times a week fit walking.                      Review of Systems  Constitutional: Negative for fatigue and unexpected weight change.  Respiratory: Negative for chest tightness and shortness of breath.   Cardiovascular: Negative for chest pain, palpitations and leg swelling.  Gastrointestinal: Negative for abdominal pain and blood in stool.  Neurological: Negative for  dizziness, syncope, light-headedness and headaches.       Objective:   Physical Exam Vitals signs reviewed.  Constitutional:      Appearance: She is well-developed.  HENT:     Head: Normocephalic and atraumatic.  Eyes:     Conjunctiva/sclera: Conjunctivae normal.     Pupils: Pupils are equal, round, and reactive to light.  Neck:     Vascular: No carotid bruit.  Cardiovascular:     Rate and Rhythm: Normal rate and regular rhythm.  Heart sounds: Normal heart sounds.  Pulmonary:     Effort: Pulmonary effort is normal.     Breath sounds: Normal breath sounds.  Abdominal:     Palpations: Abdomen is soft. There is no pulsatile mass.     Tenderness: There is no abdominal tenderness.  Skin:    General: Skin is warm and dry.  Neurological:     Mental Status: She is alert and oriented to person, place, and time.  Psychiatric:        Behavior: Behavior normal.        Assessment & Plan:   Caitlin Monroe is a 53 y.o. female Essential hypertension - Plan: amLODipine (NORVASC) 10 MG tablet, hydrochlorothiazide (MICROZIDE) 12.5 MG capsule  -  Stable, tolerating current regimen. Medications refilled. recent labs stable.   Meds ordered this encounter  Medications  . amLODipine (NORVASC) 10 MG tablet    Sig: TAKE 1 TABLET BY MOUTH EVERY DAY.    Dispense:  90 tablet    Refill:  1  . hydrochlorothiazide (MICROZIDE) 12.5 MG capsule    Sig: Take 1 capsule by mouth daily.    Dispense:  90 capsule    Refill:  1   Patient Instructions     No change in medications.  Numbers look good today as well as her kidney function on recent blood work.  Follow-up in 6 months for repeat evaluation.  Let me know if there are questions in the meantime. Thank you for coming in today.   If you have lab work done today you will be contacted with your lab results within the next 2 weeks.  If you have not heard from [place] then please contact us. The fastest way to get your results is to register for  My Chart.   IF you received an x-ray today, you will receive an invoice from [facility]. Please contact [facility] at [phone] with questions or concerns regarding your invoice.   IF you received labwork today, you will receive an invoice from [facility]. Please contact [facility] at [phone] with questions or concerns regarding your invoice.   Our billing staff will not be able to assist you with questions regarding bills from these companies.  You will be contacted with the lab results as soon as they are available. The fastest way to get your results is to activate your My Chart account. Instructions are located on the last page of this paperwork. If you have not heard from [place] regarding the results in 2 weeks, please contact this office.       Marland Kitchen

## 2019-05-03 DIAGNOSIS — C50911 Malignant neoplasm of unspecified site of right female breast: Secondary | ICD-10-CM | POA: Diagnosis not present

## 2019-06-02 ENCOUNTER — Other Ambulatory Visit: Payer: Self-pay

## 2019-06-02 DIAGNOSIS — Z20822 Contact with and (suspected) exposure to covid-19: Secondary | ICD-10-CM

## 2019-06-02 DIAGNOSIS — Z20828 Contact with and (suspected) exposure to other viral communicable diseases: Secondary | ICD-10-CM | POA: Diagnosis not present

## 2019-06-03 LAB — NOVEL CORONAVIRUS, NAA: SARS-CoV-2, NAA: NOT DETECTED

## 2019-06-13 ENCOUNTER — Other Ambulatory Visit: Payer: Self-pay

## 2019-06-13 ENCOUNTER — Encounter: Payer: Self-pay | Admitting: Hematology

## 2019-06-13 DIAGNOSIS — Z170 Estrogen receptor positive status [ER+]: Secondary | ICD-10-CM

## 2019-06-13 DIAGNOSIS — Z20828 Contact with and (suspected) exposure to other viral communicable diseases: Secondary | ICD-10-CM | POA: Diagnosis not present

## 2019-06-13 DIAGNOSIS — C50411 Malignant neoplasm of upper-outer quadrant of right female breast: Secondary | ICD-10-CM

## 2019-06-17 ENCOUNTER — Other Ambulatory Visit: Payer: Self-pay

## 2019-06-17 DIAGNOSIS — Z20822 Contact with and (suspected) exposure to covid-19: Secondary | ICD-10-CM

## 2019-06-18 LAB — NOVEL CORONAVIRUS, NAA: SARS-CoV-2, NAA: NOT DETECTED

## 2019-06-22 DIAGNOSIS — F4323 Adjustment disorder with mixed anxiety and depressed mood: Secondary | ICD-10-CM | POA: Diagnosis not present

## 2019-06-29 DIAGNOSIS — F341 Dysthymic disorder: Secondary | ICD-10-CM | POA: Diagnosis not present

## 2019-06-29 DIAGNOSIS — F408 Other phobic anxiety disorders: Secondary | ICD-10-CM | POA: Diagnosis not present

## 2019-07-06 ENCOUNTER — Encounter: Payer: Self-pay | Admitting: Hematology

## 2019-07-06 DIAGNOSIS — Z853 Personal history of malignant neoplasm of breast: Secondary | ICD-10-CM | POA: Diagnosis not present

## 2019-07-06 DIAGNOSIS — Z1231 Encounter for screening mammogram for malignant neoplasm of breast: Secondary | ICD-10-CM | POA: Diagnosis not present

## 2019-07-08 DIAGNOSIS — F341 Dysthymic disorder: Secondary | ICD-10-CM | POA: Diagnosis not present

## 2019-07-08 DIAGNOSIS — F408 Other phobic anxiety disorders: Secondary | ICD-10-CM | POA: Diagnosis not present

## 2019-09-01 ENCOUNTER — Other Ambulatory Visit: Payer: Self-pay | Admitting: Family Medicine

## 2019-09-01 DIAGNOSIS — I10 Essential (primary) hypertension: Secondary | ICD-10-CM

## 2019-09-19 ENCOUNTER — Ambulatory Visit: Payer: BC Managed Care – PPO | Admitting: Hematology

## 2019-09-19 ENCOUNTER — Other Ambulatory Visit: Payer: BC Managed Care – PPO

## 2019-09-20 ENCOUNTER — Telehealth: Payer: Self-pay | Admitting: Hematology

## 2019-09-20 NOTE — Telephone Encounter (Signed)
Rescheduled per pt's request. Called and spoke with pt, confirmed 2/5 appt

## 2019-09-21 ENCOUNTER — Inpatient Hospital Stay: Payer: BC Managed Care – PPO

## 2019-09-21 ENCOUNTER — Inpatient Hospital Stay: Payer: BC Managed Care – PPO | Admitting: Hematology

## 2019-09-26 DIAGNOSIS — F408 Other phobic anxiety disorders: Secondary | ICD-10-CM | POA: Diagnosis not present

## 2019-09-26 DIAGNOSIS — F341 Dysthymic disorder: Secondary | ICD-10-CM | POA: Diagnosis not present

## 2019-09-26 NOTE — Progress Notes (Signed)
[facility]   Telephone:([phone] Fax:([phone]) [phone]   Clinic Follow up Note   Patient Care Team: [name], MD as PCP - General (Family Medicine) Brien Few, MD as Consulting Physician (Obstetrics and Gynecology) Artelia Laroche, CNM as Midwife (Obstetrics and Gynecology) Excell Seltzer, MD (Inactive) as Consulting Physician (General Surgery) [name], MD as Consulting Physician (Hematology) Gery Pray, MD as Consulting Physician (Radiation Oncology)  Date of Service:  09/30/2019  CHIEF COMPLAINT: Follow up right breast cancer  SUMMARY OF ONCOLOGIC HISTORY: Oncology History Overview Note  Breast cancer of upper-outer quadrant of right female breast [facility])   Staging form: Breast, AJCC 7th Edition   - Clinical stage from 04/23/2016: Stage IA (T1c(m), N0, M0) - Signed by [name], MD on 04/30/2016   - Pathologic stage from 05/27/2016: Stage IIA (T2(m), N0, cM0) - Unsigned    Breast cancer of upper-outer quadrant of right female breast ([place])  04/17/2016 Mammogram   Mammogram and US showed a 2.2cm cyst at 12:00, a 1.6cm lobulated mass at 9:00 and a 59m mass at 10:00, axilla was negative    04/23/2016 Initial Diagnosis   [place]ast [place]-outer quadrant [place]   04/23/2016 Initial Biopsy   Core needle biopsy of right breast masses at 10:00 and 9:30 positions both showed invasive and insitu mammary carcinoma    04/23/2016 Receptors her2   ER 90%+, PR 80-90%+, HER2-, KI67 10-15%   05/12/2016 Procedure   Genetic testing: Negative. Genes analyzed: ATM, BAP1, BARD1, BRCA1, BRCA2, BRIP1, CDH1, CDK4, CDKN2A, CHEK2, EPCAM, FANCC, MITF, MLH1, MSH2, MSH6, NBN, PALB2, PMS2, PTEN, RAD51C, RAD51D, TP53, and XRCC2   05/27/2016 Surgery   Right breast mastectomy and sentinel lymph node biopsy ([place])   05/27/2016 Pathology Results   Right breast mastectomy showed multifocal invasive and in situ lobular carcinoma, 3.2, 0.9 and 0.9 cm, G1  margins were negative, 3 sentinel lymph nodes were negative.    05/27/2016 Oncotype testing   RS 18, which predicts 10 year distant recurrence risk of 12% with tamoxifen (intermediate risk)    11/23/2016 -  Anti-estrogen oral therapy   Tamoxifen 20 mg daily, she started with '10mg'$  daily for the first month    04/27/2018 Mammogram   Benign      CURRENT THERAPY:  adjuvant Tamoxifen '20mg'$ daily, she started on 11/23/2016 and had '10mg'$  daily for the first month. Held starting 12/06/18 due to weight gain and uncontrolled HTN.Due to no change she restarted in 12/2018.   INTERVAL HISTORY:  Caitlin PANEBIANCOis here for a follow up of right breast cancer. She was last seen by me 7 months. She presents to the clinic alone. She notes she is doing well. Her systolic BP has been very elevated and has been working with PCP to help it. She is on amlodipine '10mg'$  and HCTZ 12.'5mg'$  was added last year. She plans to see her PCP in 10/2019. She notes she drinks coffee and still eats salt.  She notes she continues to have recurrent UTIs. She feels she is overall asymptomatic but notes occasional urethra discomfort and depression per patient.  She notes she is managing her stress with therapy and mood clinic.     REVIEW OF SYSTEMS:   Constitutional: Denies fevers, chills or abnormal weight loss Eyes: Denies blurriness of vision Ears, nose, mouth, throat, and face: Denies mucositis or sore throat Respiratory: Denies cough, dyspnea or wheezes Cardiovascular: Denies palpitation, chest discomfort or lower extremity swelling Gastrointestinal:  Denies  nausea, heartburn or change in bowel habits Skin: Denies abnormal skin rashes Lymphatics: Denies new lymphadenopathy or easy bruising Neurological:Denies numbness, tingling or new weaknesses Behavioral/Psych: Mood is stable, no new changes  All other systems were reviewed with the patient and are negative.  MEDICAL HISTORY:  Past Medical History:  Diagnosis Date  .  Anxiety   . Breast cancer of upper-outer quadrant of right female breast ([place]) 04/25/2016  . Depression   . Family history of breast cancer   . Family history of prostate cancer   . Hypertension    "because of stress" (05/27/2016)  . PONV (postoperative nausea and vomiting)   . Seasonal allergies     SURGICAL HISTORY: Past Surgical History:  Procedure Laterality Date  . ABDOMINAL HYSTERECTOMY  Aug 2011   Da Vinci TAH for fibroids  . BREAST BIOPSY  03/2016  . FOOT NEUROMA SURGERY Right 2000?  Marland Kitchen MASTECTOMY COMPLETE / SIMPLE W/ SENTINEL NODE BIOPSY Right 05/27/2016  . MASTECTOMY W/ SENTINEL NODE BIOPSY Right 05/27/2016   Procedure: RIGHT TOTAL MASTECTOMY WITH AXILLARY SENTINEL LYMPH NODE BIOPSY;  Surgeon: Excell Seltzer, MD;  Location: [place];  Service: General;  Laterality: Right;  . TONSILLECTOMY    . WISDOM TOOTH EXTRACTION  ~ 1985    I have reviewed the social history and family history with the patient and they are unchanged from previous note.  ALLERGIES:  has No Known Allergies.  MEDICATIONS:  Current Outpatient Medications  Medication Sig Dispense Refill  . amLODipine (NORVASC) 10 MG tablet TAKE 1 TABLET BY MOUTH EVERY DAY 90 tablet 1  . amphetamine-dextroamphetamine (ADDERALL) 10 MG tablet 7.5 mg.   0  . cephALEXin (KEFLEX) 500 MG capsule TK ONE C PO BID FOR 3 DAYS PRN OR 1 C PO AFTER INTERCOURSE  11  . cholecalciferol (VITAMIN D) 1000 UNITS tablet Take 6,000 Units by mouth daily.     . hydrochlorothiazide (MICROZIDE) 12.5 MG capsule Take 1 capsule by mouth daily. 90 capsule 1  . ibuprofen (ADVIL,MOTRIN) 200 MG tablet Take 200 mg by mouth every 6 (six) hours as needed for mild pain.    . Lactobacillus Rhamnosus, GG, (CULTURELLE PO) Take by mouth.    . loratadine (CLARITIN) 10 MG tablet Take 10 mg by mouth daily.    . magnesium 30 MG tablet Take 500 mg by mouth daily.     . Multiple Vitamins-Minerals (MULTIVITAMIN WITH MINERALS) tablet Take 1 tablet by mouth daily.    .  Omega-3 Fatty Acids (FISH OIL PO) Take 1 capsule by mouth daily.     . tamoxifen (NOLVADEX) 20 MG tablet Take 1 tablet (20 mg total) by mouth daily. 90 tablet 3  . valACYclovir (VALTREX) 500 MG tablet TK 1 T PO D  4  . venlafaxine (EFFEXOR) 75 MG tablet Take 75 mg by mouth daily.     No current facility-administered medications for this visit.    PHYSICAL EXAMINATION: ECOG PERFORMANCE STATUS: 0 - Asymptomatic  Vitals:   09/30/19 1332 09/30/19 1337  BP: (!) 156/105 (!) 146/104  Pulse: (!) 107   Resp: 17   Temp: 98 F (36.7 C)   SpO2: 98%    Filed Weights   09/30/19 1332  Weight: 202 lb 14.4 oz (92 kg)    GENERAL:alert, no distress and comfortable SKIN: skin color, texture, turgor are normal, no rashes or significant lesions EYES: normal, Conjunctiva are pink and non-injected, sclera clear  NECK: supple, thyroid normal size, non-tender, without nodularity LYMPH:  no palpable  lymphadenopathy in the cervical, axillary  LUNGS: clear to auscultation and percussion with normal breathing effort HEART: regular rate & rhythm and no murmurs and no lower extremity edema ABDOMEN:abdomen soft, non-tender and normal bowel sounds Musculoskeletal:no cyanosis of digits and no clubbing  NEURO: alert & oriented x 3 with fluent speech, no focal motor/sensory deficits BREAST: S/p right mastectomy: Surgical incision healed well. No palpable mass, nodules or adenopathy bilaterally. Breast exam benign.   LABORATORY DATA:  I have reviewed the data as listed CBC Latest Ref Rng & Units 09/30/2019 03/18/2019 06/14/2018  WBC 4.0 - 10.5 K/uL 10.2 6.4 6.8  Hemoglobin 12.0 - 15.0 g/dL 13.9 13.6 13.3  Hematocrit 36.0 - 46.0 % 39.0 39.1 38.3  Platelets 150 - 400 K/uL 366 307 309     CMP Latest Ref Rng & Units 09/30/2019 03/18/2019 10/16/2018  Glucose 70 - 99 mg/dL 98 109(H) 90  BUN 6 - 20 mg/dL _0 Creatinine 0.44 - 1.00 mg/dL 0.95 0.87 1.07(H)  Sodium 135 - 145 mmol/L 140 138 141  Potassium 3.5 -  5.1 mmol/L 4.0 4.3 4.2  Chloride 98 - 111 mmol/L 104 107 104  CO2 22 - 32 mmol/L 25 21(L) 22  Calcium 8.9 - 10.3 mg/dL 9.0 9.0 9.0  Total Protein 6.5 - 8.1 g/dL 7.6 6.9 -  Total Bilirubin 0.3 - 1.2 mg/dL 0.4 0.3 -  Alkaline Phos 38 - 126 U/L 48 37(L) -  AST 15 - 41 U/L 17 18 -  ALT 0 - 44 U/L 20 18 -      RADIOGRAPHIC STUDIES: I have personally reviewed the radiological images as listed and agreed with the findings in the report. No results found.   ASSESSMENT & PLAN:  Caitlin Monroe is a 54 y.o. female with    1. Breast cancer of upper outer quadrant of right breast, invasive lobular carcinoma, mpT2N0M0, stage IIA, ER+/PR+/HER2-, G1, RS 18 -She was diagnosed in 03/2016. She was treated with a right mastectomy.She had multifocal disease, surgical margins were negative. Nodes were negative. -Given she was node negative, post-mastectomy radiationwas not recommended.  -She started anti-estrogen therapy with Tamoxifen in 11/23/2016. For the first month she used 44m daily.Sheistoleratingfull dose 258mdaily well.She held Tamoxifen only for month of 11/2018 due to her recent weight gain and worsened HTN. No change occurred so she restarted. She continues to tolerate well with minimal hot flashes. Her BP still not well controlled and she feels UTIs are related but she wishes to continue Tamoxifen.  -Her 05/2018 TSH and Estradial showed she was still premenopausal. Plan to switch her to AI once postmenopausal.  -From a breast cancer standpoint, She is clinically doing well. Lab reviewed, her CBC and CMP are within normal limits. Her physical exam and her 06/2019 mammogram were unremarkable. There is no clinical concern for recurrence. -Continue surveillance. Next mammogram in 06/2020 and MRI in 12/2019  -Continue Tamoxifen  -Phone call in 4 months and return in 10 months   2. Bone health -We previously discussed the effect on bone density from tamoxifen or aromatase inhibitor -I  encouraged her to take calcium and vitamin D  3. Hypertension, depression, weight gain -She has stopped Wellbutrin, and has stopped Zoloft. She is on Effexor. Her father passed away in 2/February 22, 2020nd has been grieving but not depressed. Her mood has bene managed with therapy and mood clinic for counseling. -Her BP has worsened lately.She has not been able to get her systolic BP down. She is  currently on low doseamlodipineand HCTZ. She is working to control her BP with her PCP.  -I recommend she decrease coffee and salt intake and work on her overall diet and increase exercise.  -She did gain 10-20 pounds and 2020 and her weight continues to trend up. I encouraged her to watch her weight.  -Continue follow-up with her primary care physician, Dr. Carlota Raspberry  4. Recurrent UTI  -Well managed with Wilhemina Cash as needed, and to sees urologist Dr. Tresa Moore -She is currently not complaining of urinary symptoms  -Remains recurrent, stable and manageable. I encouraged her to take more cranberry juice or supplement.    Plan -Continue Tamoxifen -She is clinically doing well. Will copy note and labs to her PCP  -I strongly encouraged her to follow-up with her PCP in the near future for her poorly controlled hypertension -Breast MRI in 12/2019 -Phone call in 4 months  -Lab and f/u in 10 months     No problem-specific Assessment & Plan notes found for this encounter.   Orders Placed This Encounter  Procedures  . MR BREAST BILATERAL W WO CONTRAST INC CAD    Standing Status:   Future    Standing Expiration Date:   11/27/2020    Order Specific Question:   If indicated for the ordered procedure, I authorize the administration of contrast media per Radiology protocol    Answer:   Yes    Order Specific Question:   What is the patient's sedation requirement?    Answer:   No Sedation    Order Specific Question:   Does the patient have a pacemaker or implanted devices?    Answer:   No    Order Specific  Question:   Radiology Contrast Protocol - do NOT remove file path    Answer:   [email or web address]    Order Specific Question:   Preferred imaging location?    Answer:   [facility] (table limit-550lbs)  . FSH-Follicle stimulating hormone    Standing Status:   Future    Standing Expiration Date:   09/29/2020   All questions were answered. The patient knows to call the clinic with any problems, questions or concerns. No barriers to learning was detected. The total time spent in the appointment was 30 minutes.     [name], MD 09/30/2019   I, Joslyn Devon, am acting as scribe for [name], MD.   I have reviewed the above documentation for accuracy and completeness, and I agree with the above.

## 2019-09-30 ENCOUNTER — Inpatient Hospital Stay (HOSPITAL_BASED_OUTPATIENT_CLINIC_OR_DEPARTMENT_OTHER): Payer: BC Managed Care – PPO | Admitting: Hematology

## 2019-09-30 ENCOUNTER — Other Ambulatory Visit: Payer: Self-pay

## 2019-09-30 ENCOUNTER — Inpatient Hospital Stay: Payer: BC Managed Care – PPO | Attending: Hematology

## 2019-09-30 ENCOUNTER — Telehealth: Payer: Self-pay | Admitting: Hematology

## 2019-09-30 ENCOUNTER — Encounter: Payer: Self-pay | Admitting: Hematology

## 2019-09-30 VITALS — BP 146/104 | HR 107 | Temp 98.0°F | Resp 17 | Ht 68.0 in | Wt 202.9 lb

## 2019-09-30 DIAGNOSIS — Z170 Estrogen receptor positive status [ER+]: Secondary | ICD-10-CM | POA: Diagnosis not present

## 2019-09-30 DIAGNOSIS — F329 Major depressive disorder, single episode, unspecified: Secondary | ICD-10-CM | POA: Diagnosis not present

## 2019-09-30 DIAGNOSIS — I10 Essential (primary) hypertension: Secondary | ICD-10-CM | POA: Diagnosis not present

## 2019-09-30 DIAGNOSIS — C50411 Malignant neoplasm of upper-outer quadrant of right female breast: Secondary | ICD-10-CM | POA: Insufficient documentation

## 2019-09-30 DIAGNOSIS — F32A Depression, unspecified: Secondary | ICD-10-CM

## 2019-09-30 DIAGNOSIS — Z9011 Acquired absence of right breast and nipple: Secondary | ICD-10-CM | POA: Diagnosis not present

## 2019-09-30 DIAGNOSIS — R635 Abnormal weight gain: Secondary | ICD-10-CM | POA: Insufficient documentation

## 2019-09-30 LAB — CBC WITH DIFFERENTIAL/PLATELET
Abs Immature Granulocytes: 0.02 10*3/uL (ref 0.00–0.07)
Basophils Absolute: 0 10*3/uL (ref 0.0–0.1)
Basophils Relative: 0 %
Eosinophils Absolute: 0.1 10*3/uL (ref 0.0–0.5)
Eosinophils Relative: 1 %
HCT: 39 % (ref 36.0–46.0)
Hemoglobin: 13.9 g/dL (ref 12.0–15.0)
Immature Granulocytes: 0 %
Lymphocytes Relative: 25 %
Lymphs Abs: 2.5 10*3/uL (ref 0.7–4.0)
MCH: 33.7 pg (ref 26.0–34.0)
MCHC: 35.6 g/dL (ref 30.0–36.0)
MCV: 94.4 fL (ref 80.0–100.0)
Monocytes Absolute: 0.5 10*3/uL (ref 0.1–1.0)
Monocytes Relative: 5 %
Neutro Abs: 7 10*3/uL (ref 1.7–7.7)
Neutrophils Relative %: 69 %
Platelets: 366 10*3/uL (ref 150–400)
RBC: 4.13 MIL/uL (ref 3.87–5.11)
RDW: 12.1 % (ref 11.5–15.5)
WBC: 10.2 10*3/uL (ref 4.0–10.5)
nRBC: 0 % (ref 0.0–0.2)

## 2019-09-30 LAB — COMPREHENSIVE METABOLIC PANEL
ALT: 20 U/L (ref 0–44)
AST: 17 U/L (ref 15–41)
Albumin: 4.4 g/dL (ref 3.5–5.0)
Alkaline Phosphatase: 48 U/L (ref 38–126)
Anion gap: 11 (ref 5–15)
BUN: 20 mg/dL (ref 6–20)
CO2: 25 mmol/L (ref 22–32)
Calcium: 9 mg/dL (ref 8.9–10.3)
Chloride: 104 mmol/L (ref 98–111)
Creatinine, Ser: 0.95 mg/dL (ref 0.44–1.00)
GFR calc Af Amer: >60 mL/min (ref >60)
GFR calc non Af Amer: >60 mL/min (ref >60)
Glucose, Bld: 98 mg/dL (ref 70–99)
Potassium: 4 mmol/L (ref 3.5–5.1)
Sodium: 140 mmol/L (ref 135–145)
Total Bilirubin: 0.4 mg/dL (ref 0.3–1.2)
Total Protein: 7.6 g/dL (ref 6.5–8.1)

## 2019-09-30 NOTE — Telephone Encounter (Signed)
Scheduled per 02/05 los, patient will receive notifications on My Chart.

## 2019-10-26 ENCOUNTER — Ambulatory Visit (INDEPENDENT_AMBULATORY_CARE_PROVIDER_SITE_OTHER): Payer: BC Managed Care – PPO | Admitting: Family Medicine

## 2019-10-26 ENCOUNTER — Other Ambulatory Visit: Payer: Self-pay

## 2019-10-26 ENCOUNTER — Encounter: Payer: Self-pay | Admitting: Family Medicine

## 2019-10-26 VITALS — BP 131/89 | HR 69 | Temp 98.1°F | Ht 69.0 in | Wt 200.0 lb

## 2019-10-26 DIAGNOSIS — E785 Hyperlipidemia, unspecified: Secondary | ICD-10-CM | POA: Diagnosis not present

## 2019-10-26 DIAGNOSIS — I10 Essential (primary) hypertension: Secondary | ICD-10-CM | POA: Diagnosis not present

## 2019-10-26 MED ORDER — HYDROCHLOROTHIAZIDE 12.5 MG PO CAPS: ORAL_CAPSULE | ORAL | 1 refills | Status: DC

## 2019-10-26 MED ORDER — AMLODIPINE BESYLATE 10 MG PO TABS: 10.0000 mg | ORAL_TABLET | Freq: Every day | ORAL | 1 refills | Status: DC

## 2019-10-26 NOTE — Progress Notes (Signed)
Subjective:  Patient ID: Caitlin Monroe, female    DOB: December 20, 1965  Age: 54 y.o. MRN: [id number]  CC:  Chief Complaint  Patient presents with  . Hypertension    HPI [name] presents for   Hypertension: Stable at evaluation September 2.  Tolerating HCTZ 12.5 mg as well as amlodipine 10 mg daily at that time.  Thought to have possible situational anxiety or whitecoat component with tachycardia previous.  TSH and CBC were okay.  followed by oncology, Dr. Burr Medico.  There was no significant change in blood pressure in the past with temporary hold of tamoxifen. Continued on tamoxifen.  Elevated at oncology visit on 2/5: has cut back to 1 cup of coffee instead of 2,  adderall only 10mg  in am - not taking second dose.  Cut back on carbs.  Some increased exercise - exercise bike 30 mins per day. Helps stress as well.  Had 1st dose Covid vaccine last week.   Has propanolol 10mg  if needed for situational anxiety - rare use.   No changes in meds since oncology.  Wt Readings from Last 3 Encounters:  10/26/19 200 lb (90.7 kg)  09/30/19 202 lb 14.4 oz (92 kg)  04/27/19 191 lb 12.8 oz (87 kg)   Home readings: 117-134/80- on home app.  Habit tracker has been helpful as well.   BP Readings from Last 3 Encounters:  10/26/19 131/89  09/30/19 (!) 146/104  04/27/19 132/81   Lab Results  Component Value Date   CREATININE 0.95 09/30/2019   Hyperlipidemia: No recent testing. Not on statin, not on meds prior.   Lab Results  Component Value Date   CHOL 255 (H) 06/27/2015   HDL 55 06/27/2015   LDLCALC 172 (H) 06/27/2015   TRIG 140 06/27/2015   CHOLHDL 4.6 06/27/2015   Lab Results  Component Value Date   ALT 20 09/30/2019   AST 17 09/30/2019   ALKPHOS 48 09/30/2019   BILITOT 0.4 09/30/2019      History Patient Active Problem List   Diagnosis Date Noted  . Breast cancer, right ([place]) 05/27/2016  . Genetic testing 05/13/2016  . Invasive carcinoma of breast ([place])  05/06/2016  . Family history of breast cancer   . Family history of prostate cancer   . Breast cancer of upper-outer quadrant of right female breast ([place]) 04/25/2016  . Depression 06/27/2015  . Seasonal affective disorder ([place]) 06/27/2015  . Allergic rhinitis 04/05/2013  . Depression with anxiety 04/05/2013   Past Medical History:  Diagnosis Date  . Anxiety   . Breast cancer of upper-outer quadrant of right female breast ([place]) 04/25/2016  . Depression   . Family history of breast cancer   . Family history of prostate cancer   . Hypertension    "because of stress" (05/27/2016)  . PONV (postoperative nausea and vomiting)   . Seasonal allergies    Past Surgical History:  Procedure Laterality Date  . ABDOMINAL HYSTERECTOMY  Aug 2011   Da Vinci TAH for fibroids  . BREAST BIOPSY  03/2016  . FOOT NEUROMA SURGERY Right 2000?  Marland Kitchen MASTECTOMY COMPLETE / SIMPLE W/ SENTINEL NODE BIOPSY Right 05/27/2016  . MASTECTOMY W/ SENTINEL NODE BIOPSY Right 05/27/2016   Procedure: RIGHT TOTAL MASTECTOMY WITH AXILLARY SENTINEL LYMPH NODE BIOPSY;  Surgeon: Excell Seltzer, MD;  Location: [facility];  Service: General;  Laterality: Right;  . TONSILLECTOMY    . WISDOM TOOTH EXTRACTION  ~ 1985   No Known Allergies Prior to Admission medications  Medication Sig Start Date End Date Taking? Authorizing Provider  amLODipine (NORVASC) 10 MG tablet TAKE 1 TABLET BY MOUTH EVERY DAY 09/01/19  Yes [name], MD  amphetamine-dextroamphetamine (ADDERALL) 10 MG tablet 7.5 mg.  06/25/17  Yes [provider]  cephALEXin (KEFLEX) 500 MG capsule TK ONE C PO BID FOR 3 DAYS PRN OR 1 C PO AFTER INTERCOURSE 11/07/17  Yes [provider]  cholecalciferol (VITAMIN D) 1000 UNITS tablet Take 6,000 Units by mouth daily.    Yes [provider]  hydrochlorothiazide (MICROZIDE) 12.5 MG capsule Take 1 capsule by mouth daily. 04/27/19  Yes [name], MD  ibuprofen (ADVIL,MOTRIN) 200 MG tablet Take 200 mg  by mouth every 6 (six) hours as needed for mild pain.   Yes [provider]  Lactobacillus Rhamnosus, GG, (CULTURELLE PO) Take by mouth.   Yes [provider]  loratadine (CLARITIN) 10 MG tablet Take 10 mg by mouth daily.   Yes [provider]  magnesium 30 MG tablet Take 500 mg by mouth daily.    Yes [provider]  Multiple Vitamins-Minerals (MULTIVITAMIN WITH MINERALS) tablet Take 1 tablet by mouth daily.   Yes [provider]  Omega-3 Fatty Acids (FISH OIL PO) Take 1 capsule by mouth daily.    Yes [provider]  propranolol (INDERAL) 10 MG tablet TK 1 T PO BID PRN FOR ANXIETY OR 20 MINUTES PRIOR TO KNOWN ANXIETY PROVOKING EVENT NTE 2/D 05/10/19  Yes [provider]  tamoxifen (NOLVADEX) 20 MG tablet Take 1 tablet (20 mg total) by mouth daily. 02/07/19  Yes [name], MD  venlafaxine (EFFEXOR) 75 MG tablet Take 75 mg by mouth daily.   Yes [provider]  valACYclovir (VALTREX) 1000 MG tablet Take 500-1,000 mg by mouth daily. 09/12/19   [provider]   Social History   Socioeconomic History  . Marital status: Legally Separated    Spouse name: Not on file  . Number of children: Not on file  . Years of education: Not on file  . Highest education level: Not on file  Occupational History  . Occupation: Education administrator  Tobacco Use  . Smoking status: Former Smoker    Packs/day: 0.50    Years: 15.00    Pack years: 7.50    Types: Cigarettes    Quit date: 06/23/1995    Years since quitting: 24.3  . Smokeless tobacco: Never Used  Substance and Sexual Activity  . Alcohol use: No    Alcohol/week: 0.0 standard drinks    Comment: 05/27/2016 "nothing since 1996"  . Drug use: No    Comment: 05/27/2016 "nothing since 1996"  . Sexual activity: Yes  Other Topics Concern  . Not on file  Social History Narrative   Married. Education: [employer]. Exercise: Gym 2 times a week fit walking.                     Social  Determinants of Health   Financial Resource Strain:   . Difficulty of Paying Living Expenses: Not on file  Food Insecurity:   . Worried About Charity fundraiser in the Last Year: Not on file  . Ran Out of Food in the Last Year: Not on file  Transportation Needs:   . Lack of Transportation (Medical): Not on file  . Lack of Transportation (Non-Medical): Not on file  Physical Activity:   . Days of Exercise per Week: Not on file  . Minutes of  Exercise per Session: Not on file  Stress:   . Feeling of Stress : Not on file  Social Connections:   . Frequency of Communication with Friends and Family: Not on file  . Frequency of Social Gatherings with Friends and Family: Not on file  . Attends Religious Services: Not on file  . Active Member of Clubs or Organizations: Not on file  . Attends Archivist Meetings: Not on file  . Marital Status: Not on file  Intimate Partner Violence:   . Fear of Current or Ex-Partner: Not on file  . Emotionally Abused: Not on file  . Physically Abused: Not on file  . Sexually Abused: Not on file    Review of Systems  Constitutional: Negative for fatigue and unexpected weight change.  Respiratory: Negative for chest tightness and shortness of breath.   Cardiovascular: Negative for chest pain, palpitations and leg swelling.  Gastrointestinal: Negative for abdominal pain and blood in stool.  Neurological: Negative for dizziness, syncope, light-headedness and headaches.     Objective:   Vitals:   10/26/19 1726  BP: 131/89  Pulse: 69  Temp: 98.1 F (36.7 C)  TempSrc: Temporal  SpO2: 96%  Weight: 200 lb (90.7 kg)  Height: 5\' 9"  (1.753 m)     Physical Exam Vitals reviewed.  Constitutional:      Appearance: She is well-developed.  HENT:     Head: Normocephalic and atraumatic.  Eyes:     Conjunctiva/sclera: Conjunctivae normal.     Pupils: Pupils are equal, round, and reactive to light.  Neck:     Vascular: No carotid bruit.    Cardiovascular:     Rate and Rhythm: Normal rate and regular rhythm.     Heart sounds: Normal heart sounds.  Pulmonary:     Effort: Pulmonary effort is normal.     Breath sounds: Normal breath sounds.  Abdominal:     Palpations: Abdomen is soft. There is no pulsatile mass.     Tenderness: There is no abdominal tenderness.  Skin:    General: Skin is warm and dry.  Neurological:     Mental Status: She is alert and oriented to person, place, and time.  Psychiatric:        Behavior: Behavior normal.      Assessment & Plan:  MIRTIE CAVETT is a 54 y.o. female . Essential hypertension  -Borderline, but certainly has increased since her last oncology visit.  Commended on positive changes with diet, exercise, positive health habits.  -Continue same dose hydrochlorothiazide, amlodipine at this time.  Has propanolol if needed for situational anxiety, performance anxiety.  If more frequent use of that medication, may not need further changes.  Option of 25 mg HCTZ if persistent elevations.  Hyperlipidemia, unspecified hyperlipidemia type - Plan: Lipid panel  -No recent testing, plan for lab only visit for lipid panel, 6 month follow-up in office.    No orders of the defined types were placed in this encounter.  Patient Instructions    Blood pressure looks okay today although borderline elevated.  Home numbers look better.  Okay to continue same dose of hydrochlorothiazide and amlodipine at this time but if your blood pressure is running in the 140s or 90s you can take 2 of the hydrochlorothiazide each day.  Let me know if you end up making that change and I can send in a new dose.  If you end up taking the propanolol more frequently, that also will lower blood pressure.  Let me know if that is the case.  Follow-up in 6 months for blood work and recheck blood pressure.  Lab only visit for lipids in next week if possible.     If you have lab work done today you will be contacted with  your lab results within the next 2 weeks.  If you have not heard from [place] then please contact us. The fastest way to get your results is to register for My Chart.   IF you received an x-ray today, you will receive an invoice from [facility]. Please contact [facility] Radiology at [phone] with questions or concerns regarding your invoice.   IF you received labwork today, you will receive an invoice from [facility]. Please contact [facility] at [phone] with questions or concerns regarding your invoice.   Our billing staff will not be able to assist you with questions regarding bills from these companies.  You will be contacted with the lab results as soon as they are available. The fastest way to get your results is to activate your My Chart account. Instructions are located on the last page of this paperwork. If you have not heard from [place] regarding the results in 2 weeks, please contact this office.          Signed, [name], MD [facility]

## 2019-10-26 NOTE — Patient Instructions (Addendum)
  Blood pressure looks okay today although borderline elevated.  Home numbers look better.  Okay to continue same dose of hydrochlorothiazide and amlodipine at this time but if your blood pressure is running in the 140s or 90s you can take 2 of the hydrochlorothiazide each day.  Let me know if you end up making that change and I can send in a new dose.  If you end up taking the propanolol more frequently, that also will lower blood pressure.  Let me know if that is the case.  Follow-up in 6 months for blood work and recheck blood pressure.  Lab only visit for lipids in next week if possible.     If you have lab work done today you will be contacted with your lab results within the next 2 weeks.  If you have not heard from [place] then please contact us. The fastest way to get your results is to register for My Chart.   IF you received an x-ray today, you will receive an invoice from [facility] Radiology. Please contact [facility] Radiology at [phone] with questions or concerns regarding your invoice.   IF you received labwork today, you will receive an invoice from [place]. Please contact [facility] at [phone] with questions or concerns regarding your invoice.   Our billing staff will not be able to assist you with questions regarding bills from these companies.  You will be contacted with the lab results as soon as they are available. The fastest way to get your results is to activate your My Chart account. Instructions are located on the last page of this paperwork. If you have not heard from [place] regarding the results in 2 weeks, please contact this office.

## 2019-10-28 ENCOUNTER — Other Ambulatory Visit: Payer: Self-pay

## 2019-10-28 ENCOUNTER — Ambulatory Visit: Payer: BC Managed Care – PPO

## 2019-10-28 DIAGNOSIS — E785 Hyperlipidemia, unspecified: Secondary | ICD-10-CM | POA: Diagnosis not present

## 2019-10-29 LAB — LIPID PANEL
Chol/HDL Ratio: 4.6 ratio — ABNORMAL HIGH (ref 0.0–4.4)
Cholesterol, Total: 221 mg/dL — ABNORMAL HIGH (ref 100–199)
HDL: 48 mg/dL (ref >39)
LDL Chol Calc (NIH): 135 mg/dL — ABNORMAL HIGH (ref 0–99)
Triglycerides: 210 mg/dL — ABNORMAL HIGH (ref 0–149)
VLDL Cholesterol Cal: 38 mg/dL (ref 5–40)

## 2019-12-15 DIAGNOSIS — F4323 Adjustment disorder with mixed anxiety and depressed mood: Secondary | ICD-10-CM | POA: Diagnosis not present

## 2019-12-26 DIAGNOSIS — F4312 Post-traumatic stress disorder, chronic: Secondary | ICD-10-CM | POA: Diagnosis not present

## 2019-12-26 DIAGNOSIS — F429 Obsessive-compulsive disorder, unspecified: Secondary | ICD-10-CM | POA: Diagnosis not present

## 2019-12-26 DIAGNOSIS — F341 Dysthymic disorder: Secondary | ICD-10-CM | POA: Diagnosis not present

## 2019-12-27 ENCOUNTER — Ambulatory Visit
Admission: RE | Admit: 2019-12-27 | Discharge: 2019-12-27 | Disposition: A | Discharge status: Home / Self Care | Payer: BC Managed Care – PPO | Source: Ambulatory Visit | Attending: Hematology | Admitting: Hematology

## 2019-12-27 DIAGNOSIS — C50411 Malignant neoplasm of upper-outer quadrant of right female breast: Secondary | ICD-10-CM

## 2019-12-27 DIAGNOSIS — Z803 Family history of malignant neoplasm of breast: Secondary | ICD-10-CM | POA: Diagnosis not present

## 2019-12-27 DIAGNOSIS — Z853 Personal history of malignant neoplasm of breast: Secondary | ICD-10-CM | POA: Diagnosis not present

## 2019-12-27 DIAGNOSIS — Z170 Estrogen receptor positive status [ER+]: Secondary | ICD-10-CM

## 2019-12-27 MED ORDER — GADOBUTROL 1 MMOL/ML IV SOLN
9.0000 mL | Freq: Once | INTRAVENOUS | Status: AC | PRN
  Administered 2019-12-27: 9 mL via INTRAVENOUS

## 2020-01-05 DIAGNOSIS — C50911 Malignant neoplasm of unspecified site of right female breast: Secondary | ICD-10-CM | POA: Diagnosis not present

## 2020-01-17 ENCOUNTER — Other Ambulatory Visit: Payer: Self-pay | Admitting: Hematology

## 2020-01-17 DIAGNOSIS — Z170 Estrogen receptor positive status [ER+]: Secondary | ICD-10-CM

## 2020-01-26 DIAGNOSIS — N3020 Other chronic cystitis without hematuria: Secondary | ICD-10-CM | POA: Diagnosis not present

## 2020-01-26 DIAGNOSIS — R8271 Bacteriuria: Secondary | ICD-10-CM | POA: Diagnosis not present

## 2020-01-27 ENCOUNTER — Telehealth: Payer: Self-pay | Admitting: Hematology

## 2020-01-27 NOTE — Progress Notes (Signed)
[facility]   [phone] [phone]   Clinic Follow up Note   Patient Care Team: [name], MD as PCP - General (Family Medicine) Brien Few, MD as Consulting Physician (Obstetrics and Gynecology) Artelia Laroche, CNM as Midwife (Obstetrics and Gynecology) Excell Seltzer, MD (Inactive) as Consulting Physician (General Surgery) [name], MD as Consulting Physician (Hematology) Gery Pray, MD as Consulting Physician (Radiation Oncology)   Date of Service:  01/30/2020   CHIEF COMPLAINT: Follow up right breast cancer  SUMMARY OF ONCOLOGIC HISTORY: Oncology History Overview Note  Breast cancer of upper-outer quadrant of right female breast [facility])   Staging form: Breast, AJCC 7th Edition   - Clinical stage from 04/23/2016: Stage IA (T1c(m), N0, M0) - Signed by [name], MD on 04/30/2016   - Pathologic stage from 05/27/2016: Stage IIA (T2(m), N0, cM0) - Unsigned    Breast cancer of upper-outer quadrant of right female breast ([place])  04/17/2016 Mammogram   Mammogram and US showed a 2.2cm cyst at 12:00, a 1.6cm lobulated mass at 9:00 and a 62m mass at 10:00, axilla was negative    04/23/2016 Initial Diagnosis   [place]   04/23/2016 Initial Biopsy   Core needle biopsy of right breast masses at 10:00 and 9:30 positions both showed invasive and insitu mammary carcinoma    04/23/2016 Receptors her2   ER 90%+, PR 80-90%+, HER2-, KI67 10-15%   05/12/2016 Procedure   Genetic testing: Negative. Genes analyzed: ATM, BAP1, BARD1, BRCA1, BRCA2, BRIP1, CDH1, CDK4, CDKN2A, CHEK2, EPCAM, FANCC, MITF, MLH1, MSH2, MSH6, NBN, PALB2, PMS2, PTEN, RAD51C, RAD51D, TP53, and XRCC2   05/27/2016 Surgery   Right breast mastectomy and sentinel lymph node biopsy ([place])   05/27/2016 Pathology Results   Right breast mastectomy showed multifocal invasive and in situ lobular carcinoma, 3.2, 0.9 and 0.9 cm,  G1 margins were negative, 3 sentinel lymph nodes were negative.    05/27/2016 Oncotype testing   RS 18, which predicts 10 year distant recurrence risk of 12% with tamoxifen (intermediate risk)    11/23/2016 -  Anti-estrogen oral therapy   Tamoxifen 20 mg daily, she started with 131mdaily for the first month    04/27/2018 Mammogram   Benign      CURRENT THERAPY:  adjuvant Tamoxifen 2046mily, she started on 11/23/2016 and had 75m40mily for the first month. Held starting 12/06/18 due to weight gain and uncontrolled HTN.Due to no change she restarted in 12/2018.   INTERVAL HISTORY:  Caitlin INNOCENThere for a follow up of right breast cancer. She presents to the clinic alone. She notes she is doing well. She notes her weight is stable but she is trying to lose weight. With prophylactic Keflex she has not gone into full blown UTI recently. She tries to stay hydrated, cranberry capsule and denies fever or dysuria. She notes she is tolerating Tamoxifen. She notes mild hot flashes. 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 She notes she is not getting much sleep.    REVIEW OF SYSTEMS:   Constitutional: Denies fevers, chills or abnormal weight loss (+) Fatigue  Eyes: Denies blurriness of vision Ears, nose, mouth, throat, and face: Denies mucositis or sore throat Respiratory: Denies cough, dyspnea or wheezes Cardiovascular: Denies palpitation, chest discomfort or lower extremity swelling Gastrointestinal:  Denies nausea, heartburn or change in bowel habits Skin: Denies abnormal skin rashes Lymphatics: Denies new lymphadenopathy or easy bruising Neurological:Denies numbness,  tingling or new weaknesses Behavioral/Psych: Mood is stable, no new changes  All other systems were reviewed with the patient and are negative.  MEDICAL HISTORY:  Past Medical History:  Diagnosis Date  . Anxiety   . Breast cancer of upper-outer quadrant of right female breast ([place]) 04/25/2016  .  Depression   . Family history of breast cancer   . Family history of prostate cancer   . Hypertension    "because of stress" (05/27/2016)  . PONV (postoperative nausea and vomiting)   . Seasonal allergies     SURGICAL HISTORY: Past Surgical History:  Procedure Laterality Date  . ABDOMINAL HYSTERECTOMY  Aug 2011   Da Vinci TAH for fibroids  . BREAST BIOPSY  03/2016  . FOOT NEUROMA SURGERY Right 2000?  Marland Kitchen MASTECTOMY COMPLETE / SIMPLE W/ SENTINEL NODE BIOPSY Right 05/27/2016  . MASTECTOMY W/ SENTINEL NODE BIOPSY Right 05/27/2016   Procedure: RIGHT TOTAL MASTECTOMY WITH AXILLARY SENTINEL LYMPH NODE BIOPSY;  Surgeon: Excell Seltzer, MD;  Location: [facility];  Service: General;  Laterality: Right;  . TONSILLECTOMY    . WISDOM TOOTH EXTRACTION  ~ 1985    I have reviewed the social history and family history with the patient and they are unchanged from previous note.  ALLERGIES:  has No Known Allergies.  MEDICATIONS:  Current Outpatient Medications  Medication Sig Dispense Refill  . amLODipine (NORVASC) 10 MG tablet Take 1 tablet (10 mg total) by mouth daily. 90 tablet 1  . amphetamine-dextroamphetamine (ADDERALL) 10 MG tablet 7.5 mg.   0  . cephALEXin (KEFLEX) 500 MG capsule TK ONE C PO BID FOR 3 DAYS PRN OR 1 C PO AFTER INTERCOURSE  11  . cholecalciferol (VITAMIN D) 1000 UNITS tablet Take 6,000 Units by mouth daily.     . hydrochlorothiazide (MICROZIDE) 12.5 MG capsule Take 1 capsule by mouth daily. 90 capsule 1  . ibuprofen (ADVIL,MOTRIN) 200 MG tablet Take 200 mg by mouth every 6 (six) hours as needed for mild pain.    . Lactobacillus Rhamnosus, GG, (CULTURELLE PO) Take by mouth.    . loratadine (CLARITIN) 10 MG tablet Take 10 mg by mouth daily.    . magnesium 30 MG tablet Take 500 mg by mouth daily.     . Multiple Vitamins-Minerals (MULTIVITAMIN WITH MINERALS) tablet Take 1 tablet by mouth daily.    . Omega-3 Fatty Acids (FISH OIL PO) Take 1 capsule by mouth daily.     . propranolol  (INDERAL) 10 MG tablet TK 1 T PO BID PRN FOR ANXIETY OR 20 MINUTES PRIOR TO KNOWN ANXIETY PROVOKING EVENT NTE 2/D    . tamoxifen (NOLVADEX) 20 MG tablet Take 1 tablet by mouth daily. 90 tablet 3  . valACYclovir (VALTREX) 1000 MG tablet Take 500-1,000 mg by mouth daily.    Marland Kitchen venlafaxine (EFFEXOR) 75 MG tablet Take 75 mg by mouth daily.     No current facility-administered medications for this visit.    PHYSICAL EXAMINATION: ECOG PERFORMANCE STATUS: 0 - Asymptomatic  Vitals:   01/30/20 1455  BP: (!) 138/94  Pulse: (!) 124  Resp: 18  Temp: 98.6 F (37 C)  SpO2: 97%   Filed Weights   01/30/20 1455  Weight: 200 lb 14.4 oz (91.1 kg)    Due to COVID19 we will limit examination to appearance. Patient had no complaints.  GENERAL:alert, no distress and comfortable SKIN: skin color normal, no rashes or significant lesions EYES: normal, Conjunctiva are pink and non-injected, sclera clear  NEURO: alert &  oriented x 3 with fluent speech   LABORATORY DATA:  I have reviewed the data as listed CBC Latest Ref Rng & Units 09/30/2019 03/18/2019 06/14/2018  WBC 4.0 - 10.5 K/uL 10.2 6.4 6.8  Hemoglobin 12.0 - 15.0 g/dL 13.9 13.6 13.3  Hematocrit 36.0 - 46.0 % 39.0 39.1 38.3  Platelets 150 - 400 K/uL 366 307 309     CMP Latest Ref Rng & Units 09/30/2019 03/18/2019 10/16/2018  Glucose 70 - 99 mg/dL 98 109(H) 90  BUN 6 - 20 mg/dL _0 Creatinine 0.44 - 1.00 mg/dL 0.95 0.87 1.07(H)  Sodium 135 - 145 mmol/L 140 138 141  Potassium 3.5 - 5.1 mmol/L 4.0 4.3 4.2  Chloride 98 - 111 mmol/L 104 107 104  CO2 22 - 32 mmol/L 25 21(L) 22  Calcium 8.9 - 10.3 mg/dL 9.0 9.0 9.0  Total Protein 6.5 - 8.1 g/dL 7.6 6.9 -  Total Bilirubin 0.3 - 1.2 mg/dL 0.4 0.3 -  Alkaline Phos 38 - 126 U/L 48 37(L) -  AST 15 - 41 U/L 17 18 -  ALT 0 - 44 U/L 20 18 -      RADIOGRAPHIC STUDIES: I have personally reviewed the radiological images as listed and agreed with the findings in the report. No results found.    ASSESSMENT & PLAN:  RAELA BOHL is a 54 y.o. female with    1. Breast cancer of upper outer quadrant of right breast, invasive lobular carcinoma, mpT2N0M0, stage IIA, ER+/PR+/HER2-, G1, RS 18 -She was diagnosed in 03/2016. She was treated with a right mastectomy.She had multifocal disease, surgical margins were negative. Nodes were negative. -Given she was node negative, post-mastectomy radiationwas not recommended.  -She started anti-estrogen therapy with Tamoxifen in 11/23/2016. For the first month she used 54m daily.Sheistoleratingfull dose 261mdaily well.She held Tamoxifen only for month of 11/2018 due to her recent weight gain and worsened HTN. No change occurred so she restarted. She continues to tolerate well with minimal hot flashes. Her BP still not well controlled and she feels UTIs are related but she wishes to continue Tamoxifen.  -Her 05/2018 TSH and Estradial showed she was still premenopausal. Plan to switch her to AI once postmenopausal. Will repeat [facility] next lab  -She is clinically doing well. Her 12/2019 MRI was unremarkable. There is no clinical concern for recurrence. -Continue Surveillance. Next Mammogram in 06/2020 and next MRI breast in 12/2020.  -Continue Tamoxifen  -f/u in 6 months   2. Bone health -We previously discussed the effect on bone density from tamoxifen or aromatase inhibitor -I encouraged her to take calcium and vitamin D  3. Hypertension, depression, weight gain -She has stopped Wellbutrin, and has stopped Zoloft. She is on Effexor. Her father passed away in 09/27/05/2020nd has been grieving but not depressed. Her mood has been managed with therapy and mood clinic for counseling. 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  -Her weight is stable but she is working on losing weight. She has been better managing her BP with her PCP, Dr. GrCarlota Raspberry4. Recurrent UTI   -Well managed withKeflex as needed, Cranberry pill and Azoas  needed, and to sees urologist Dr. MaTresa MooreNo full no UTI since.    Plan -Continue Tamoxifen  -Mammogram in 06/2020, ordered today  -phone visit in 6 months, she will see PCP with lab in 3 months.    No problem-specific Assessment & Plan notes found for this encounter.   Orders  Placed This Encounter  Procedures  . MM DIAG BREAST TOMO BILATERAL    Standing Status:   Future    Standing Expiration Date:   01/29/2021    Scheduling Instructions:     [facility]    Order Specific Question:   Reason for Exam (SYMPTOM  OR DIAGNOSIS REQUIRED)    Answer:   screening    Order Specific Question:   Is the patient pregnant?    Answer:   No    Order Specific Question:   Preferred imaging location?    Answer:   External    Order Specific Question:   Release to patient    Answer:   Immediate   All questions were answered. The patient knows to call the clinic with any problems, questions or concerns. No barriers to learning was detected. The total time spent in the appointment was 20 minutes.   [name], MD 01/30/2020   I, Joslyn Devon, am acting as scribe for [name], MD.   I have reviewed the above documentation for accuracy and completeness, and I agree with the above.

## 2020-01-30 ENCOUNTER — Encounter: Payer: Self-pay | Admitting: Hematology

## 2020-01-30 ENCOUNTER — Inpatient Hospital Stay: Payer: BC Managed Care – PPO | Attending: Hematology | Admitting: Hematology

## 2020-01-30 ENCOUNTER — Other Ambulatory Visit: Payer: Self-pay

## 2020-01-30 VITALS — BP 138/94 | HR 124 | Temp 98.6°F | Resp 18 | Ht 69.0 in | Wt 200.9 lb

## 2020-01-30 DIAGNOSIS — I10 Essential (primary) hypertension: Secondary | ICD-10-CM | POA: Insufficient documentation

## 2020-01-30 DIAGNOSIS — N951 Menopausal and female climacteric states: Secondary | ICD-10-CM | POA: Insufficient documentation

## 2020-01-30 DIAGNOSIS — Z170 Estrogen receptor positive status [ER+]: Secondary | ICD-10-CM | POA: Diagnosis not present

## 2020-01-30 DIAGNOSIS — Z9011 Acquired absence of right breast and nipple: Secondary | ICD-10-CM | POA: Insufficient documentation

## 2020-01-30 DIAGNOSIS — C50411 Malignant neoplasm of upper-outer quadrant of right female breast: Secondary | ICD-10-CM | POA: Insufficient documentation

## 2020-01-30 DIAGNOSIS — F329 Major depressive disorder, single episode, unspecified: Secondary | ICD-10-CM | POA: Diagnosis not present

## 2020-03-22 DIAGNOSIS — F429 Obsessive-compulsive disorder, unspecified: Secondary | ICD-10-CM | POA: Diagnosis not present

## 2020-03-22 DIAGNOSIS — F1010 Alcohol abuse, uncomplicated: Secondary | ICD-10-CM | POA: Diagnosis not present

## 2020-03-22 DIAGNOSIS — F341 Dysthymic disorder: Secondary | ICD-10-CM | POA: Diagnosis not present

## 2020-03-22 DIAGNOSIS — F4312 Post-traumatic stress disorder, chronic: Secondary | ICD-10-CM | POA: Diagnosis not present

## 2020-04-06 DIAGNOSIS — M25511 Pain in right shoulder: Secondary | ICD-10-CM | POA: Diagnosis not present

## 2020-04-06 DIAGNOSIS — M542 Cervicalgia: Secondary | ICD-10-CM | POA: Diagnosis not present

## 2020-04-24 DIAGNOSIS — F4323 Adjustment disorder with mixed anxiety and depressed mood: Secondary | ICD-10-CM | POA: Diagnosis not present

## 2020-04-26 ENCOUNTER — Ambulatory Visit: Payer: BC Managed Care – PPO | Admitting: Family Medicine

## 2020-06-11 DIAGNOSIS — F429 Obsessive-compulsive disorder, unspecified: Secondary | ICD-10-CM | POA: Diagnosis not present

## 2020-06-11 DIAGNOSIS — F4312 Post-traumatic stress disorder, chronic: Secondary | ICD-10-CM | POA: Diagnosis not present

## 2020-06-11 DIAGNOSIS — F341 Dysthymic disorder: Secondary | ICD-10-CM | POA: Diagnosis not present

## 2020-06-26 ENCOUNTER — Other Ambulatory Visit: Payer: Self-pay | Admitting: Family Medicine

## 2020-06-26 DIAGNOSIS — I10 Essential (primary) hypertension: Secondary | ICD-10-CM

## 2020-06-26 DIAGNOSIS — Z6830 Body mass index (BMI) 30.0-30.9, adult: Secondary | ICD-10-CM | POA: Diagnosis not present

## 2020-06-26 DIAGNOSIS — Z01419 Encounter for gynecological examination (general) (routine) without abnormal findings: Secondary | ICD-10-CM | POA: Diagnosis not present

## 2020-06-26 NOTE — Telephone Encounter (Signed)
Requested Prescriptions  Pending Prescriptions Disp Refills   hydrochlorothiazide (MICROZIDE) 12.5 MG capsule [Pharmacy Med Name: HYDROCHLOROTHIAZIDE 12.5MG  CAPSULES] 30 capsule 0    Sig: TAKE 1 CAPSULE BY MOUTH DAILY     Cardiovascular: Diuretics - Thiazide Failed - 06/26/2020  8:27 PM      Failed - Last BP in normal range    BP Readings from Last 1 Encounters:  01/30/20 (!) 138/94         Failed - Valid encounter within last 6 months    Recent Outpatient Visits          8 months ago Essential hypertension   [facility], Ranell Patrick, MD   1 year ago Essential hypertension   [facility], Ranell Patrick, MD   1 year ago Tachycardia   [facility], Ranell Patrick, MD   1 year ago Tachycardia   [facility], Ranell Patrick, MD   2 years ago Hyperglycemia   [facility], Ranell Patrick, MD      Future Appointments            In 3 days [name], MD [facility], [place] in normal range and within 360 days    Calcium  Date Value Ref Range Status  09/30/2019 9.0 8.9 - 10.3 mg/dL Final  04/16/2017 8.9 8.4 - 10.4 mg/dL Final         Passed - Cr in normal range and within 360 days    Creatinine  Date Value Ref Range Status  04/16/2017 0.8 0.6 - 1.1 mg/dL Final   Creatinine, Ser  Date Value Ref Range Status  09/30/2019 0.95 0.44 - 1.00 mg/dL Final         Passed - K in normal range and within 360 days    Potassium  Date Value Ref Range Status  09/30/2019 4.0 3.5 - 5.1 mmol/L Final  04/16/2017 4.5 3.5 - 5.1 mEq/L Final         Passed - Na in normal range and within 360 days    Sodium  Date Value Ref Range Status  09/30/2019 140 135 - 145 mmol/L Final  10/16/2018 141 134 - 144 mmol/L Final  04/16/2017 138 136 - 145 mEq/L Final         One month supply with reminder for patient to schedule an appointment with provider for follow-up.

## 2020-06-29 ENCOUNTER — Other Ambulatory Visit: Payer: Self-pay

## 2020-06-29 ENCOUNTER — Encounter: Payer: Self-pay | Admitting: Family Medicine

## 2020-06-29 ENCOUNTER — Ambulatory Visit (INDEPENDENT_AMBULATORY_CARE_PROVIDER_SITE_OTHER): Payer: BC Managed Care – PPO | Admitting: Family Medicine

## 2020-06-29 VITALS — BP 140/82 | HR 107 | Temp 98.0°F | Ht 68.0 in | Wt 199.4 lb

## 2020-06-29 DIAGNOSIS — E785 Hyperlipidemia, unspecified: Secondary | ICD-10-CM | POA: Diagnosis not present

## 2020-06-29 DIAGNOSIS — I10 Essential (primary) hypertension: Secondary | ICD-10-CM

## 2020-06-29 DIAGNOSIS — Z1159 Encounter for screening for other viral diseases: Secondary | ICD-10-CM

## 2020-06-29 DIAGNOSIS — Z23 Encounter for immunization: Secondary | ICD-10-CM

## 2020-06-29 MED ORDER — HYDROCHLOROTHIAZIDE 12.5 MG PO CAPS: 12.5000 mg | ORAL_CAPSULE | Freq: Every day | ORAL | 2 refills | Status: DC

## 2020-06-29 MED ORDER — AMLODIPINE BESYLATE 10 MG PO TABS: 10.0000 mg | ORAL_TABLET | Freq: Every day | ORAL | 2 refills | Status: DC

## 2020-06-29 NOTE — Progress Notes (Signed)
Subjective:  Patient ID: Caitlin Monroe, female    DOB: 09-07-1965  Age: 54 y.o. MRN: [id number]  CC:  Chief Complaint  Patient presents with   Hypertension    Patient here for f/u non blood pressure.    HPI Caitlin Monroe presents for   Hypertension: Last discussed in March.  Had adjusted diet including decreasing caffeine at that time.  Increase exercise at that time which is also been helpful for stress management.  Rarely use propranolol for situational anxiety.  Treated with amlodipine, hydrochlorothiazide.  Option of 25 mg dose if elevations. Blood pressure 138/94 at oncology visit June 7. Ran out of HCTZ temporarily, BP in 150/100 at OB, restarted yesterday at 12.5mg   Amlodipine 10mg  qd.  HA yesterday, better today.  Home readings on meds: normal range in August.  Propranolol - rare use.   BP Readings from Last 3 Encounters:  06/29/20 140/82  01/30/20 (!) 138/94  10/26/19 131/89   Lab Results  Component Value Date   CREATININE 0.95 09/30/2019    Hyperlipidemia: coffee with cream this am, no food.  No current statin.  Exercise - 2 times per week, walking.  The 10-year ASCVD risk score Mikey Bussing DC Brooke Bonito., et al., 2013) is: 3.7%   Values used to calculate the score:     Age: 31 years     Sex: Female     Is Non-Hispanic African American: No     Diabetic: No     Tobacco smoker: No     Systolic Blood Pressure: 185 mmHg     Is BP treated: Yes     HDL Cholesterol: 48 mg/dL     Total Cholesterol: 221 mg/dL  Lab Results  Component Value Date   CHOL 221 (H) 10/28/2019   HDL 48 10/28/2019   LDLCALC 135 (H) 10/28/2019   TRIG 210 (H) 10/28/2019   CHOLHDL 4.6 (H) 10/28/2019   Lab Results  Component Value Date   ALT 20 09/30/2019   AST 17 09/30/2019   ALKPHOS 48 09/30/2019   BILITOT 0.4 09/30/2019    Had moderna booster.  Flu vaccine today.   History Patient Active Problem List   Diagnosis Date Noted   Breast cancer, right ([place]) 05/27/2016   Genetic  testing 05/13/2016   Invasive carcinoma of breast ([place]) 05/06/2016   Family history of breast cancer    Family history of prostate cancer    Breast cancer of upper-outer quadrant of right female breast ([place]) 04/25/2016   Depression 06/27/2015   Seasonal affective disorder ([place]) 06/27/2015   Allergic rhinitis 04/05/2013   Depression with anxiety 04/05/2013   Past Medical History:  Diagnosis Date   Anxiety    Breast cancer of upper-outer quadrant of right female breast ([place]) 04/25/2016   Depression    Family history of breast cancer    Family history of prostate cancer    Hypertension    "because of stress" (05/27/2016)   PONV (postoperative nausea and vomiting)    Seasonal allergies    Past Surgical History:  Procedure Laterality Date   ABDOMINAL HYSTERECTOMY  Aug 2011   Da Vinci TAH for fibroids   BREAST BIOPSY  03/2016   FOOT NEUROMA SURGERY Right 2000?   MASTECTOMY COMPLETE / SIMPLE W/ SENTINEL NODE BIOPSY Right 05/27/2016   MASTECTOMY W/ SENTINEL NODE BIOPSY Right 05/27/2016   Procedure: RIGHT TOTAL MASTECTOMY WITH AXILLARY SENTINEL LYMPH NODE BIOPSY;  Surgeon: Excell Seltzer, MD;  Location: [place];  Service: General;  Laterality:  Right;   TONSILLECTOMY     WISDOM TOOTH EXTRACTION  ~ 1985   No Known Allergies Prior to Admission medications   Medication Sig Start Date End Date Taking? Authorizing Provider  amLODipine (NORVASC) 10 MG tablet Take 1 tablet (10 mg total) by mouth daily. 10/26/19  Yes [name], MD  amphetamine-dextroamphetamine (ADDERALL) 10 MG tablet 7.5 mg.  06/25/17  Yes [provider]  cephALEXin (KEFLEX) 500 MG capsule TK ONE C PO BID FOR 3 DAYS PRN OR 1 C PO AFTER INTERCOURSE 11/07/17  Yes [provider]  cholecalciferol (VITAMIN D) 1000 UNITS tablet Take 6,000 Units by mouth daily.    Yes [provider]  hydrochlorothiazide (MICROZIDE) 12.5 MG capsule TAKE 1 CAPSULE BY MOUTH DAILY 06/26/20  Yes  [name], MD  ibuprofen (ADVIL,MOTRIN) 200 MG tablet Take 200 mg by mouth every 6 (six) hours as needed for mild pain.   Yes [provider]  Lactobacillus Rhamnosus, GG, (CULTURELLE PO) Take by mouth.   Yes [provider]  loratadine (CLARITIN) 10 MG tablet Take 10 mg by mouth daily.   Yes [provider]  magnesium 30 MG tablet Take 500 mg by mouth daily.    Yes [provider]  Multiple Vitamins-Minerals (MULTIVITAMIN WITH MINERALS) tablet Take 1 tablet by mouth daily.   Yes [provider]  Omega-3 Fatty Acids (FISH OIL PO) Take 1 capsule by mouth daily.    Yes [provider]  propranolol (INDERAL) 10 MG tablet TK 1 T PO BID PRN FOR ANXIETY OR 20 MINUTES PRIOR TO KNOWN ANXIETY PROVOKING EVENT NTE 2/D 05/10/19  Yes [provider]  tamoxifen (NOLVADEX) 20 MG tablet Take 1 tablet by mouth daily. 01/17/20  Yes [name], MD  valACYclovir (VALTREX) 1000 MG tablet Take 500-1,000 mg by mouth daily. 09/12/19  Yes [provider]  venlafaxine (EFFEXOR) 75 MG tablet Take 75 mg by mouth daily.   Yes [provider]   Social History   Socioeconomic History   Marital status: Divorced    Spouse name: Not on file   Number of children: Not on file   Years of education: Not on file   Highest education level: Not on file  Occupational History   Occupation: Education administrator  Tobacco Use   Smoking status: Former Smoker    Packs/day: 0.50    Years: 15.00    Pack years: 7.50    Types: Cigarettes    Quit date: 06/23/1995    Years since quitting: 25.0   Smokeless tobacco: Never Used  Substance and Sexual Activity   Alcohol use: No    Alcohol/week: 0.0 standard drinks    Comment: 05/27/2016 "nothing since 1996"   Drug use: No    Comment: 05/27/2016 "nothing since 1996"   Sexual activity: Yes  Other Topics Concern   Not on file  Social History Narrative   Married. Education: [employer]. Exercise: Gym 2 times  a week fit walking.                     Social Determinants of Health   Financial Resource Strain:    Difficulty of Paying Living Expenses: Not on file  Food Insecurity:    Worried About Charity fundraiser in the Last Year: Not on file   [employer] of Food in the Last Year: Not on file  Transportation Needs:    Lack of Transportation (Medical): Not on file   Lack of  Transportation (Non-Medical): Not on file  Physical Activity:    Days of Exercise per Week: Not on file   Minutes of Exercise per Session: Not on file  Stress:    Feeling of Stress : Not on file  Social Connections:    Frequency of Communication with Friends and Family: Not on file   Frequency of Social Gatherings with Friends and Family: Not on file   Attends Religious Services: Not on file   Active Member of Clubs or Organizations: Not on file   Attends Archivist Meetings: Not on file   Marital Status: Not on file  Intimate Partner Violence:    Fear of Current or Ex-Partner: Not on file   Emotionally Abused: Not on file   Physically Abused: Not on file   Sexually Abused: Not on file    Review of Systems  Constitutional: Negative for fatigue and unexpected weight change.  Respiratory: Negative for chest tightness and shortness of breath.   Cardiovascular: Negative for chest pain, palpitations and leg swelling.  Gastrointestinal: Negative for abdominal pain and blood in stool.  Neurological: Positive for headaches (yesterday only ). Negative for dizziness, syncope and light-headedness.     Objective:   Vitals:   06/29/20 0851  BP: 140/82  Pulse: (!) 107  Temp: 98 F (36.7 C)  TempSrc: Temporal  SpO2: 97%  Weight: 199 lb 6.4 oz (90.4 kg)  Height: 5\' 8"  (1.727 m)     Physical Exam Vitals reviewed.  Constitutional:      Appearance: She is well-developed.  HENT:     Head: Normocephalic and atraumatic.  Eyes:     Conjunctiva/sclera: Conjunctivae normal.     Pupils:  Pupils are equal, round, and reactive to light.  Neck:     Vascular: No carotid bruit.  Cardiovascular:     Rate and Rhythm: Normal rate and regular rhythm.     Heart sounds: Normal heart sounds.  Pulmonary:     Effort: Pulmonary effort is normal.     Breath sounds: Normal breath sounds.  Abdominal:     Palpations: Abdomen is soft. There is no pulsatile mass.     Tenderness: There is no abdominal tenderness.  Skin:    General: Skin is warm and dry.  Neurological:     Mental Status: She is alert and oriented to person, place, and time.  Psychiatric:        Behavior: Behavior normal.        Assessment & Plan:  Caitlin Monroe is a 54 y.o. female . Essential hypertension - Plan: Comprehensive metabolic panel, hydrochlorothiazide (MICROZIDE) 12.5 MG capsule, amLODipine (NORVASC) 10 MG tablet  -  Stable back on meds, tolerating current regimen. Medications refilled. Labs pending as above.   Need for prophylactic vaccination and inoculation against influenza - Plan: Flu Vaccine QUAD 6+ mos PF IM (Fluarix Quad PF)  Need for hepatitis C screening test - Plan: Hepatitis C antibody  Hyperlipidemia, unspecified hyperlipidemia type - Plan: Lipid Panel  - low ascvd risk score. Check labs. continue exercise.   Need for immunization against influenza - Plan: Flu Vaccine QUAD 36+ mos IM   No orders of the defined types were placed in this encounter.  Patient Instructions       If you have lab work done today you will be contacted with your lab results within the next 2 weeks.  If you have not heard from [place] then please contact us. The fastest way to get your results  is to register for My Chart.   IF you received an x-ray today, you will receive an invoice from [facility]. Please contact [facility] at [phone] with questions or concerns regarding your invoice.   IF you received labwork today, you will receive an invoice from [facility]. Please contact [facility]  at [phone] with questions or concerns regarding your invoice.   Our billing staff will not be able to assist you with questions regarding bills from these companies.  You will be contacted with the lab results as soon as they are available. The fastest way to get your results is to activate your My Chart account. Instructions are located on the last page of this paperwork. If you have not heard from [place] regarding the results in 2 weeks, please contact this office.          Signed, [name], MD [facility]

## 2020-06-29 NOTE — Patient Instructions (Addendum)
°  No change in meds at this time. Keep a record of your blood pressures outside of the office and if running over 140/90 - let me know.   If you have lab work done today you will be contacted with your lab results within the next 2 weeks.  If you have not heard from [place] then please contact us. The fastest way to get your results is to register for My Chart.   IF you received an x-ray today, you will receive an invoice from [facility]. Please contact [facility] at [phone] with questions or concerns regarding your invoice.   IF you received labwork today, you will receive an invoice from [facility]. Please contact [facility] at [phone] with questions or concerns regarding your invoice.   Our billing staff will not be able to assist you with questions regarding bills from these companies.  You will be contacted with the lab results as soon as they are available. The fastest way to get your results is to activate your My Chart account. Instructions are located on the last page of this paperwork. If you have not heard from [place] regarding the results in 2 weeks, please contact this office.

## 2020-06-30 LAB — COMPREHENSIVE METABOLIC PANEL
ALT: 18 IU/L (ref 0–32)
AST: 20 IU/L (ref 0–40)
Albumin/Globulin Ratio: 1.8 (ref 1.2–2.2)
Albumin: 4.5 g/dL (ref 3.8–4.9)
Alkaline Phosphatase: 52 IU/L (ref 44–121)
BUN/Creatinine Ratio: 20 (ref 9–23)
BUN: 18 mg/dL (ref 6–24)
Bilirubin Total: 0.3 mg/dL (ref 0.0–1.2)
CO2: 23 mmol/L (ref 20–29)
Calcium: 9.8 mg/dL (ref 8.7–10.2)
Chloride: 101 mmol/L (ref 96–106)
Creatinine, Ser: 0.92 mg/dL (ref 0.57–1.00)
GFR calc Af Amer: 82 mL/min/{1.73_m2} (ref >59)
GFR calc non Af Amer: 71 mL/min/{1.73_m2} (ref >59)
Globulin, Total: 2.5 g/dL (ref 1.5–4.5)
Glucose: 105 mg/dL — ABNORMAL HIGH (ref 65–99)
Potassium: 4.2 mmol/L (ref 3.5–5.2)
Sodium: 138 mmol/L (ref 134–144)
Total Protein: 7 g/dL (ref 6.0–8.5)

## 2020-06-30 LAB — LIPID PANEL
Chol/HDL Ratio: 4.8 ratio — ABNORMAL HIGH (ref 0.0–4.4)
Cholesterol, Total: 231 mg/dL — ABNORMAL HIGH (ref 100–199)
HDL: 48 mg/dL (ref >39)
LDL Chol Calc (NIH): 137 mg/dL — ABNORMAL HIGH (ref 0–99)
Triglycerides: 258 mg/dL — ABNORMAL HIGH (ref 0–149)
VLDL Cholesterol Cal: 46 mg/dL — ABNORMAL HIGH (ref 5–40)

## 2020-06-30 LAB — HEPATITIS C ANTIBODY: Hep C Virus Ab: <0.1 s/co ratio (ref 0.0–0.9)

## 2020-07-09 DIAGNOSIS — Z1231 Encounter for screening mammogram for malignant neoplasm of breast: Secondary | ICD-10-CM | POA: Diagnosis not present

## 2020-08-01 ENCOUNTER — Other Ambulatory Visit: Payer: BC Managed Care – PPO

## 2020-08-01 ENCOUNTER — Inpatient Hospital Stay: Payer: BC Managed Care – PPO | Attending: Hematology | Admitting: Nurse Practitioner

## 2020-08-01 ENCOUNTER — Ambulatory Visit: Payer: BC Managed Care – PPO | Admitting: Hematology

## 2020-08-01 ENCOUNTER — Encounter: Payer: Self-pay | Admitting: Nurse Practitioner

## 2020-08-01 DIAGNOSIS — C50411 Malignant neoplasm of upper-outer quadrant of right female breast: Secondary | ICD-10-CM | POA: Diagnosis not present

## 2020-08-01 DIAGNOSIS — Z170 Estrogen receptor positive status [ER+]: Secondary | ICD-10-CM

## 2020-08-01 NOTE — Progress Notes (Signed)
[facility]   [phone] [phone]) [phone]   Clinic Follow up Note   Patient Care Team: Caitlin Agreste, MD as PCP - General (Family Medicine) Caitlin Few, MD as Consulting Physician (Obstetrics and Gynecology) Caitlin Monroe., CNM as Midwife (Obstetrics and Gynecology) Caitlin Seltzer, MD (Inactive) as Consulting Physician (General Surgery) Caitlin Merle, MD as Consulting Physician (Hematology) Caitlin Pray, MD as Consulting Physician (Radiation Oncology) 08/01/2020   I connected with Caitlin Monroe on 08/01/20 at 11:20 AM EST by telephone visit and verified that I am speaking with the correct person using two identifiers.   I discussed the limitations, risks, security and privacy concerns of performing an evaluation and management service by telemedicine and the availability of in-person appointments. I also discussed with the patient that there may be a patient responsible charge related to this service. The patient expressed understanding and agreed to proceed.   Other persons participating in the visit and their role in the encounter: None  Patient's location: Office Provider's location: Home office   CHIEF COMPLAINT: Follow up right breast cancer    SUMMARY OF ONCOLOGIC HISTORY: Oncology History Overview Note  Breast cancer of upper-outer quadrant of right female breast [facility])   Staging form: Breast, AJCC 7th Edition   - Clinical stage from 04/23/2016: Stage IA (T1c(m), N0, M0) - Signed by Caitlin Merle, MD on 04/30/2016   - Pathologic stage from 05/27/2016: Stage IIA (T2(m), N0, cM0) - Unsigned    Breast cancer of upper-outer quadrant of right female breast ([place])  04/17/2016 Mammogram   Mammogram and US showed a 2.2cm cyst at 12:00, a 1.6cm lobulated mass at 9:00 and a 34m mass at 10:00, axilla was negative    04/23/2016 Initial Diagnosis   Breast [place] of upper-outer quadrant of [place]   04/23/2016 Initial Biopsy   Core  needle biopsy of right breast masses at 10:00 and 9:30 positions both showed invasive and insitu mammary carcinoma    04/23/2016 Receptors her2   ER 90%+, PR 80-90%+, HER2-, KI67 10-15%   05/12/2016 Procedure   Genetic testing: Negative. Genes analyzed: ATM, BAP1, BARD1, BRCA1, BRCA2, BRIP1, CDH1, CDK4, CDKN2A, CHEK2, EPCAM, FANCC, MITF, MLH1, MSH2, MSH6, NBN, PALB2, PMS2, PTEN, RAD51C, RAD51D, TP53, and XRCC2   05/27/2016 Surgery   Right breast mastectomy and sentinel lymph node biopsy ([place])   05/27/2016 Pathology Results   Right breast mastectomy showed multifocal invasive and in situ lobular carcinoma, 3.2, 0.9 and 0.9 cm, G1 margins were negative, 3 sentinel lymph nodes were negative.    05/27/2016 Oncotype testing   RS 18, which predicts 10 year distant recurrence risk of 12% with tamoxifen (intermediate risk)    11/23/2016 -  Anti-estrogen oral therapy   Tamoxifen 20 mg daily, she started with 144mdaily for the first month    04/27/2018 Mammogram   Benign     CURRENT THERAPY: Tamoxifen 20 mg daily, starting 11/2016   INTERVAL HISTORY: Caitlin Monroe for scheduled virtual visit. She had recent screening left mammo that was clear. She continues self checks and is aware of her body. Also had Ob/gyn pelvic exam and everything is good. She continues tamoxifen, tolerating with hot flashes and weight gain. Her sleep issues resolved by using weighted blanket, cooling her room, and taking melatonin. She had transient constipation from keto diet that resolved when she stopped it. Denies recent fever, chills, cough, chest pain, dyspnea, bone/joint pain, headaches, bleeding, abdominal pain/distention, or other new concerns.  MEDICAL HISTORY:  Past Medical History:  Diagnosis Date  . Anxiety   . Breast cancer of upper-outer quadrant of right female breast ([place]) 04/25/2016  . Depression   . Family history of breast cancer   . Family history of prostate cancer   . Hypertension     "because of stress" (05/27/2016)  . PONV (postoperative nausea and vomiting)   . Seasonal allergies     SURGICAL HISTORY: Past Surgical History:  Procedure Laterality Date  . ABDOMINAL HYSTERECTOMY  Aug 2011   Da Vinci TAH for fibroids  . BREAST BIOPSY  03/2016  . FOOT NEUROMA SURGERY Right 2000?  Marland Kitchen MASTECTOMY COMPLETE / SIMPLE W/ SENTINEL NODE BIOPSY Right 05/27/2016  . MASTECTOMY W/ SENTINEL NODE BIOPSY Right 05/27/2016   Procedure: RIGHT TOTAL MASTECTOMY WITH AXILLARY SENTINEL LYMPH NODE BIOPSY;  Surgeon: Caitlin Seltzer, MD;  Location: [place];  Service: General;  Laterality: Right;  . TONSILLECTOMY    . WISDOM TOOTH EXTRACTION  ~ 1985    I have reviewed the social history and family history with the patient and they are unchanged from previous note.  ALLERGIES:  has No Known Allergies.  MEDICATIONS:  Current Outpatient Medications  Medication Sig Dispense Refill  . amLODipine (NORVASC) 10 MG tablet Take 1 tablet (10 mg total) by mouth daily. 90 tablet 2  . amphetamine-dextroamphetamine (ADDERALL) 10 MG tablet 7.5 mg.   0  . cephALEXin (KEFLEX) 500 MG capsule TK ONE C PO BID FOR 3 DAYS PRN OR 1 C PO AFTER INTERCOURSE  11  . cholecalciferol (VITAMIN D) 1000 UNITS tablet Take 6,000 Units by mouth daily.     . hydrochlorothiazide (MICROZIDE) 12.5 MG capsule Take 1 capsule (12.5 mg total) by mouth daily. 90 capsule 2  . ibuprofen (ADVIL,MOTRIN) 200 MG tablet Take 200 mg by mouth every 6 (six) hours as needed for mild pain.    . Lactobacillus Rhamnosus, GG, (CULTURELLE PO) Take by mouth.    . loratadine (CLARITIN) 10 MG tablet Take 10 mg by mouth daily.    . magnesium 30 MG tablet Take 500 mg by mouth daily.     . Multiple Vitamins-Minerals (MULTIVITAMIN WITH MINERALS) tablet Take 1 tablet by mouth daily.    . Omega-3 Fatty Acids (FISH OIL PO) Take 1 capsule by mouth daily.     . propranolol (INDERAL) 10 MG tablet TK 1 T PO BID PRN FOR ANXIETY OR 20 MINUTES PRIOR TO KNOWN ANXIETY  PROVOKING EVENT NTE 2/D    . tamoxifen (NOLVADEX) 20 MG tablet Take 1 tablet by mouth daily. 90 tablet 3  . valACYclovir (VALTREX) 1000 MG tablet Take 500-1,000 mg by mouth daily.    Marland Kitchen venlafaxine (EFFEXOR) 75 MG tablet Take 75 mg by mouth daily.     No current facility-administered medications for this visit.    PHYSICAL EXAMINATION: ECOG PERFORMANCE STATUS: 0 - Asymptomatic  There were no vitals filed for this visit. There were no vitals filed for this visit.  Patient appears well over the phone. Speech is clear, appropriate mood. No cough or conversational dyspnea.     LABORATORY DATA:  No labs for today's visit. I have reviewed the data as listed CBC Latest Ref Rng & Units 09/30/2019 03/18/2019 06/14/2018  WBC 4.0 - 10.5 K/uL 10.2 6.4 6.8  Hemoglobin 12.0 - 15.0 g/dL 13.9 13.6 13.3  Hematocrit 36 - 46 % 39.0 39.1 38.3  Platelets 150 - 400 K/uL 366 307 309     CMP Latest Ref Rng &  Units 06/29/2020 09/30/2019 03/18/2019  Glucose 65 - 99 mg/dL 105(H) 98 109(H)  BUN 6 - 24 mg/dL '18 20 16  ' Creatinine 0.57 - 1.00 mg/dL 0.92 0.95 0.87  Sodium 134 - 144 mmol/L 138 140 138  Potassium 3.5 - 5.2 mmol/L 4.2 4.0 4.3  Chloride 96 - 106 mmol/L 101 104 107  CO2 20 - 29 mmol/L 23 25 21(L)  Calcium 8.7 - 10.2 mg/dL 9.8 9.0 9.0  Total Protein 6.0 - 8.5 g/dL 7.0 7.6 6.9  Total Bilirubin 0.0 - 1.2 mg/dL 0.3 0.4 0.3  Alkaline Phos 44 - 121 IU/L 52 48 37(L)  AST 0 - 40 IU/L '20 17 18  ' ALT 0 - 32 IU/L '18 20 18      ' RADIOGRAPHIC STUDIES: I have personally reviewed the radiological images as listed and agreed with the findings in the report. No results found.   ASSESSMENT & PLAN: Caitlin Monroe is a 54 y.o. female with    1. Breast cancer of upper outer quadrant of right breast, invasive lobular carcinoma, mpT2N0M0, stage IIA, ER+/PR+/HER2-, G1, RS 18 -Diagnosed in 03/2016. She was treated with a right mastectomy.She had multifocal disease, surgical margins were negative. Node  negative. -she did not require adjuvant chemotherapy due to low RS or post-mastectomy radiation  -She started anti-estrogen therapy with Tamoxifen in 11/23/2016. She held Tamoxifen formonth of4/2020 due to weight gain and worsened HTN. No change occurred so she restarted. Stable hot flash and weight gain.  -Her 05/2018 TSH and Estradial showed she was stillpremenopausal. Plan to switch her to AI once postmenopausal. Will repeat [place] on next lab  -Her 12/2019 MRI was unremarkable. Per patient 06/2020 left mammogram also negative. Will request the report. There is no clinical concern for recurrence. -continue tamoxifen and surveillance -f/up in 6 months   2. Bone health -continue calcium and vitamin D  3. Hypertension, depression, weight gain -She has stopped Wellbutrin and Zoloft. She is on Effexor and takes propranolol PRN for anxiety  -sleep has also improved  -follow up PCP, Dr. Carlota Raspberry  4. Recurrent UTI   -Well managed withKeflex, Cranberry pill and AzoPRN -f/up urology Dr. Tresa Moore -not discussed today   Disposition:  Caitlin Monroe is clinically doing well. Tolerating tamoxifen with hot flashes and weight gain. No signs of breast cancer recurrence. MRI 12/2019 negative. She reports having a negative left screening mammogram in 06/2020 at [facility], I will request the report.   Continue breast cancer surveillance. Next MRI in 12/2020 and left screening mammo in 06/2021. She is up to date on routine wellness exams and age-appropriate cancer screenings.   She will return for in-person visit in 01/2021, will check hormone levels. If she is postmenopausal will switch to AI. Continue surveillance and tamoxifen. She knows to contact us sooner with any new or worsening concerns.    Orders Placed This Encounter  Procedures  . MR BREAST BILATERAL W WO CONTRAST INC CAD    Standing Status:   Future    Standing Expiration Date:   08/01/2021    Order Specific Question:   If indicated for the ordered  procedure, I authorize the administration of contrast media per Radiology protocol    Answer:   Yes    Order Specific Question:   What is the patient's sedation requirement?    Answer:   No Sedation    Order Specific Question:   Does the patient have a pacemaker or implanted devices?    Answer:   No  Order Specific Question:   Preferred imaging location?    Answer:   [facility] (table limit-550lbs)   All questions were answered. The patient knows to call the clinic with any problems, questions or concerns. No barriers to learning were detected. Total encounter time was 15 minutes.      Alla Feeling, NP 08/01/20

## 2020-08-02 ENCOUNTER — Telehealth: Payer: Self-pay | Admitting: Hematology

## 2020-08-02 NOTE — Telephone Encounter (Signed)
Scheduled appointments per 12/8 los. Mailed updated calendar to patient.

## 2020-10-01 ENCOUNTER — Encounter: Payer: Self-pay | Admitting: Hematology

## 2020-11-02 ENCOUNTER — Ambulatory Visit: Payer: BC Managed Care – PPO | Admitting: Family Medicine

## 2020-12-15 ENCOUNTER — Other Ambulatory Visit: Payer: Self-pay | Admitting: Hematology

## 2020-12-15 DIAGNOSIS — C50411 Malignant neoplasm of upper-outer quadrant of right female breast: Secondary | ICD-10-CM

## 2020-12-15 DIAGNOSIS — Z170 Estrogen receptor positive status [ER+]: Secondary | ICD-10-CM

## 2020-12-28 ENCOUNTER — Ambulatory Visit: Payer: Self-pay | Admitting: Family Medicine

## 2021-01-14 ENCOUNTER — Telehealth: Payer: Self-pay | Admitting: Hematology

## 2021-01-14 ENCOUNTER — Inpatient Hospital Stay: Payer: Self-pay

## 2021-01-14 NOTE — Telephone Encounter (Signed)
R/s appts per 5/23 sch msg. Pt tested positive for Covid. R/s pt to the end of June so that she can r/s the MRI as well before her visit with the MD. Pt is aware of updated appt date and time.

## 2021-01-28 ENCOUNTER — Ambulatory Visit: Payer: BC Managed Care – PPO | Admitting: Hematology

## 2021-02-18 ENCOUNTER — Other Ambulatory Visit: Payer: Self-pay

## 2021-02-18 ENCOUNTER — Ambulatory Visit: Payer: Self-pay | Admitting: Hematology

## 2021-02-27 ENCOUNTER — Other Ambulatory Visit: Payer: Self-pay

## 2021-03-08 ENCOUNTER — Inpatient Hospital Stay (HOSPITAL_BASED_OUTPATIENT_CLINIC_OR_DEPARTMENT_OTHER): Payer: 59 | Admitting: Hematology

## 2021-03-08 ENCOUNTER — Inpatient Hospital Stay: Payer: 59 | Attending: Hematology

## 2021-03-08 DIAGNOSIS — C50411 Malignant neoplasm of upper-outer quadrant of right female breast: Secondary | ICD-10-CM

## 2021-03-08 DIAGNOSIS — Z170 Estrogen receptor positive status [ER+]: Secondary | ICD-10-CM

## 2021-03-15 ENCOUNTER — Ambulatory Visit: Payer: Self-pay | Admitting: Registered Nurse

## 2021-03-19 ENCOUNTER — Ambulatory Visit: Payer: Self-pay | Admitting: Registered Nurse

## 2021-03-20 ENCOUNTER — Encounter: Payer: Self-pay | Admitting: Family Medicine

## 2021-03-20 ENCOUNTER — Ambulatory Visit (INDEPENDENT_AMBULATORY_CARE_PROVIDER_SITE_OTHER): Payer: 59 | Admitting: Family Medicine

## 2021-03-20 ENCOUNTER — Other Ambulatory Visit: Payer: Self-pay

## 2021-03-20 VITALS — BP 134/88 | HR 100 | Temp 98.3°F | Resp 16 | Ht 68.0 in | Wt 206.4 lb

## 2021-03-20 DIAGNOSIS — E785 Hyperlipidemia, unspecified: Secondary | ICD-10-CM

## 2021-03-20 DIAGNOSIS — I10 Essential (primary) hypertension: Secondary | ICD-10-CM | POA: Diagnosis not present

## 2021-03-20 DIAGNOSIS — R609 Edema, unspecified: Secondary | ICD-10-CM | POA: Diagnosis not present

## 2021-03-20 DIAGNOSIS — Z23 Encounter for immunization: Secondary | ICD-10-CM

## 2021-03-20 MED ORDER — AMLODIPINE BESYLATE 5 MG PO TABS: 5.0000 mg | ORAL_TABLET | Freq: Every day | ORAL | 1 refills | Status: DC

## 2021-03-20 MED ORDER — HYDROCHLOROTHIAZIDE 25 MG PO TABS: 25.0000 mg | ORAL_TABLET | Freq: Every day | ORAL | 3 refills | Status: DC

## 2021-03-20 NOTE — Progress Notes (Signed)
Subjective:  Patient ID: Caitlin Monroe, female    DOB: 06/01/1966  Age: 55 y.o. MRN: [id number]  CC:  Chief Complaint  Patient presents with   Hypertension    Pt here for BP recheck and medication refill, pt reports no physical sxs, brought BP readings from home doing okay    Immunizations    Pt is willing to start pneumonia vaccine as well as shingles vaccine today if recommended     HPI ABRAHAM DIRK presents for   Hypertension: Last discussed in November 2021, out of HCTZ temporarily at that time with elevations of BP.  Was continued on amlodipine 10 mg daily, rare use of propranolol for situational anxiety (no recent use). Continued on HCTZ 12.5 mg daily with option to 25 mg.  Taking HCTZ 12.'5mg'$  and amlodipine '10mg'$  daily. Out of temporarily for about a week in June. Complaint with both recently.  L-tyrosine supplement since fatigue since June, post covid - better now. Feels better on this supplement and reading body reset diet.  Work is going well.   Home readings: 129/89, 134/90. Highest 136/102 down to 135/95 on recheck.  Past few weeks - 129/89.  Changing diet above, increased walking past 2 weeks - 10,000 steps per day.  Some swelling in lower legs, some discoloration. Some weight gain. No CP, no dyspnea. No orthopnea, no PND.  Wt Readings from Last 3 Encounters:  03/20/21 206 lb 6.4 oz (93.6 kg)  06/29/20 199 lb 6.4 oz (90.4 kg)  01/30/20 200 lb 14.4 oz (91.1 kg)   BP Readings from Last 3 Encounters:  03/20/21 134/88  06/29/20 140/82  01/30/20 (!) 138/94   Lab Results  Component Value Date   CREATININE 0.92 06/29/2020   Some recent issue with insurance coverage for MRI and follow up with oncologist.   She has been trying to work with oncologist for the imaging and follow up. Did receive call during OV, planning on MRI first.  Hyperlipidemia: Elevations in November but low ASCVD risk score, not on statin. Mom has CHF and stent for past 20 years - 55yo.   No early heart disease in family.  The 10-year ASCVD risk score Mikey Bussing DC Brooke Bonito., et al., 2013) is: 3.9%   Values used to calculate the score:     Age: 30 years     Sex: Female     Is Non-Hispanic African American: No     Diabetic: No     Tobacco smoker: No     Systolic Blood Pressure: Q000111Q mmHg     Is BP treated: Yes     HDL Cholesterol: 48 mg/dL     Total Cholesterol: 231 mg/dL  Lab Results  Component Value Date   CHOL 231 (H) 06/29/2020   HDL 48 06/29/2020   LDLCALC 137 (H) 06/29/2020   TRIG 258 (H) 06/29/2020   CHOLHDL 4.8 (H) 06/29/2020   Lab Results  Component Value Date   ALT 18 06/29/2020   AST 20 06/29/2020   ALKPHOS 52 06/29/2020   BILITOT 0.3 06/29/2020    HM: Prevnar today, deferring shingrix to nurse visit.    History Patient Active Problem List   Diagnosis Date Noted   Breast cancer, right ([place]) 05/27/2016   Genetic testing 05/13/2016   Invasive carcinoma of breast ([place]) 05/06/2016   Family history of breast cancer    Family history of prostate cancer    Breast cancer of upper-outer quadrant of right female breast ([place]) 04/25/2016   Depression 06/27/2015  Seasonal affective disorder ([place]) 06/27/2015   Allergic rhinitis 04/05/2013   Depression with anxiety 04/05/2013   Past Medical History:  Diagnosis Date   Anxiety    Breast cancer of upper-outer quadrant of right female breast ([facility]) 04/25/2016   Depression    Family history of breast cancer    Family history of prostate cancer    Hypertension    "because of stress" (05/27/2016)   PONV (postoperative nausea and vomiting)    Seasonal allergies    Past Surgical History:  Procedure Laterality Date   ABDOMINAL HYSTERECTOMY  Aug 2011   [employer] TAH for fibroids   BREAST BIOPSY  03/2016   FOOT NEUROMA SURGERY Right 2000?   MASTECTOMY COMPLETE / SIMPLE W/ SENTINEL NODE BIOPSY Right 05/27/2016   MASTECTOMY W/ SENTINEL NODE BIOPSY Right 05/27/2016   Procedure: RIGHT TOTAL MASTECTOMY WITH AXILLARY  SENTINEL LYMPH NODE BIOPSY;  Surgeon: Excell Seltzer, MD;  Location: [facility];  Service: General;  Laterality: Right;   TONSILLECTOMY     WISDOM TOOTH EXTRACTION  ~ 1985   No Known Allergies Prior to Admission medications   Medication Sig Start Date End Date Taking? Authorizing Provider  amLODipine (NORVASC) 10 MG tablet Take 1 tablet (10 mg total) by mouth daily. 06/29/20  Yes [name], MD  amphetamine-dextroamphetamine (ADDERALL) 10 MG tablet 7.5 mg.  06/25/17  Yes [provider]  cephALEXin (KEFLEX) 500 MG capsule TK ONE C PO BID FOR 3 DAYS PRN OR 1 C PO AFTER INTERCOURSE 11/07/17  Yes [provider]  cholecalciferol (VITAMIN D) 1000 UNITS tablet Take 6,000 Units by mouth daily.    Yes [provider]  hydrochlorothiazide (MICROZIDE) 12.5 MG capsule Take 1 capsule (12.5 mg total) by mouth daily. 06/29/20  Yes [name], MD  ibuprofen (ADVIL,MOTRIN) 200 MG tablet Take 200 mg by mouth every 6 (six) hours as needed for mild pain.   Yes [provider]  Lactobacillus Rhamnosus, GG, (CULTURELLE PO) Take by mouth.   Yes [provider]  loratadine (CLARITIN) 10 MG tablet Take 10 mg by mouth daily.   Yes [provider]  magnesium 30 MG tablet Take 500 mg by mouth daily.    Yes [provider]  Multiple Vitamins-Minerals (MULTIVITAMIN WITH MINERALS) tablet Take 1 tablet by mouth daily.   Yes [provider]  Omega-3 Fatty Acids (FISH OIL PO) Take 1 capsule by mouth daily.    Yes [provider]  propranolol (INDERAL) 10 MG tablet TK 1 T PO BID PRN FOR ANXIETY OR 20 MINUTES PRIOR TO KNOWN ANXIETY PROVOKING EVENT NTE 2/D 05/10/19  Yes [provider]  tamoxifen (NOLVADEX) 20 MG tablet Take 1 tablet by mouth daily. 12/17/20  Yes [name], MD  valACYclovir (VALTREX) 1000 MG tablet Take 500-1,000 mg by mouth daily. 09/12/19  Yes [provider]  venlafaxine (EFFEXOR) 75 MG tablet Take 75 mg by  mouth daily.   Yes [provider]   Social History   Socioeconomic History   Marital status: Divorced    Spouse name: Not on file   Number of children: Not on file   Years of education: Not on file   Highest education level: Not on file  Occupational History   Occupation: Education administrator  Tobacco Use   Smoking status: Former    Packs/day: 0.50    Years: 15.00    Pack years: 7.50    Types: Cigarettes    Quit date: 06/23/1995  Years since quitting: 25.7   Smokeless tobacco: Never  Substance and Sexual Activity   Alcohol use: No    Alcohol/week: 0.0 standard drinks    Comment: 05/27/2016 "nothing since 1996"   Drug use: No    Comment: 05/27/2016 "nothing since 1996"   Sexual activity: Yes  Other Topics Concern   Not on file  Social History Narrative   Married. Education: [employer]. Exercise: Gym 2 times a week fit walking.                     Social Determinants of Health   Financial Resource Strain: Not on file  Food Insecurity: Not on file  Transportation Needs: Not on file  Physical Activity: Not on file  Stress: Not on file  Social Connections: Not on file  Intimate Partner Violence: Not on file    Review of Systems  Constitutional:  Negative for fatigue and unexpected weight change.  Respiratory:  Negative for chest tightness and shortness of breath.   Cardiovascular:  Negative for chest pain, palpitations and leg swelling.  Gastrointestinal:  Negative for abdominal pain and blood in stool.  Neurological:  Negative for dizziness, syncope, light-headedness and headaches.    Objective:   Vitals:   03/20/21 1542  BP: 134/88  Pulse: 100  Resp: 16  Temp: 98.3 F (36.8 C)  TempSrc: Temporal  SpO2: 94%  Weight: 206 lb 6.4 oz (93.6 kg)  Height: '5\' 8"'$  (1.727 m)     Physical Exam Vitals reviewed.  Constitutional:      Appearance: Normal appearance. She is well-developed.  HENT:     Head: Normocephalic and atraumatic.  Eyes:      Conjunctiva/sclera: Conjunctivae normal.     Pupils: Pupils are equal, round, and reactive to light.  Neck:     Vascular: No carotid bruit.  Cardiovascular:     Rate and Rhythm: Normal rate and regular rhythm.     Heart sounds: Normal heart sounds.  Pulmonary:     Effort: Pulmonary effort is normal.     Breath sounds: Normal breath sounds.  Abdominal:     Palpations: Abdomen is soft. There is no pulsatile mass.     Tenderness: There is no abdominal tenderness.  Musculoskeletal:     Right lower leg: Edema (Right greater than left, 1-2+ with slight pitting on the right versus 1+ left with few faint stasis changes on right greater than left) present.     Left lower leg: Edema present.  Skin:    General: Skin is warm and dry.  Neurological:     Mental Status: She is alert and oriented to person, place, and time.  Psychiatric:        Mood and Affect: Mood normal.        Behavior: Behavior normal.       Assessment & Plan:  TEIONNA POPKE is a 55 y.o. female . Essential hypertension - Plan: amLODipine (NORVASC) 5 MG tablet, hydrochlorothiazide (HYDRODIURIL) 25 MG tablet  Hyperlipidemia, unspecified hyperlipidemia type  Peripheral edema  Need for vaccination for Strep pneumoniae - Plan: Pneumococcal conjugate vaccine 20-valent (Prevnar 20)  Various causes of peripheral edema discussed including amlodipine, weight gain, dependent edema.  Can try changing amlodipine to lower dose, increasing HCTZ with lab only visit in 2 weeks.  Handout given for pedal edema and other causes.  Denies chest pain, dyspnea or other CHF symptoms.  Commended on diet changes, increase exercise for weight loss.  We will check baseline  labs in 2 weeks, but low ASCVD risk score.  Could consider coronary calcium scoring with CAD in her mother but that was not early CAD.  May help with decision on statin.  No new meds for now.  Prevnar 20 given.  Plans to return for Shingrix   Meds ordered this encounter   Medications   amLODipine (NORVASC) 5 MG tablet    Sig: Take 1 tablet (5 mg total) by mouth daily.    Dispense:  90 tablet    Refill:  1   hydrochlorothiazide (HYDRODIURIL) 25 MG tablet    Sig: Take 1 tablet (25 mg total) by mouth daily.    Dispense:  90 tablet    Refill:  3   Patient Instructions  Swelling may be due to multiple causes. Try increasing hctz to '25mg'$  each day, decrease amlodipine to 5 mg each day. Keep up the good work with watching diet, and increasing walking should help with weight management as well. Let me know how things are going in the next few weeks.   Diet and exercise should help cholesterol levels as well. Return for fasting labs in next 2-3 weeks. Depending on levels can consider coronary calcium scoring to decide on cholesterol med.   Peripheral Edema  Peripheral edema is swelling that is caused by a buildup of fluid. Peripheral edema most often affects the lower legs, ankles, and feet. It can also develop in the arms, hands, and face. The area of the body that has peripheral edema will look swollen. It may also feel heavy or warm. Your clothes may start to feel tight. Pressing on the area may make a temporary dent in your skin. Youmay not be able to move your swollen arm or leg as much as usual. There are many causes of peripheral edema. It can happen because of a complication of other conditions such as congestive heart failure, kidney disease, or a problem with your blood circulation. It also can be a side effect of certain medicines or because of an infection. It often happens to womenduring pregnancy. Sometimes, the cause is not known. Follow these instructions at home: Managing pain, stiffness, and swelling  Raise (elevate) your legs while you are sitting or lying down. Move around often to prevent stiffness and to lessen swelling. Do not sit or stand for long periods of time. Wear support stockings as told by your health care provider.  Medicines Take  over-the-counter and prescription medicines only as told by your health care provider. Your health care provider may prescribe medicine to help your body get rid of excess water (diuretic). General instructions Pay attention to any changes in your symptoms. Follow instructions from your health care provider about limiting salt (sodium) in your diet. Sometimes, eating less salt may reduce swelling. Moisturize skin daily to help prevent skin from cracking and draining. Keep all follow-up visits as told by your health care provider. This is important. Contact a health care provider if you have: A fever. Edema that starts suddenly or is getting worse, especially if you are pregnant or have a medical condition. Swelling in only one leg. Increased swelling, redness, or pain in one or both of your legs. Drainage or sores at the area where you have edema. Get help right away if you: Develop shortness of breath, especially when you are lying down. Have pain in your chest or abdomen. Feel weak. Feel faint. Summary Peripheral edema is swelling that is caused by a buildup of fluid. Peripheral edema most  often affects the lower legs, ankles, and feet. Move around often to prevent stiffness and to lessen swelling. Do not sit or stand for long periods of time. Pay attention to any changes in your symptoms. Contact a health care provider if you have edema that starts suddenly or is getting worse, especially if you are pregnant or have a medical condition. Get help right away if you develop shortness of breath, especially when lying down. This information is not intended to replace advice given to you by your health care provider. Make sure you discuss any questions you have with your healthcare provider. Document Revised: 05/05/2018 Document Reviewed: 05/05/2018 Elsevier Patient Education  2022 [place] Your Hypertension Hypertension, also called high blood pressure, is when the force  of the blood pressing against the walls of the arteries is too strong. Arteries are blood vessels that carry blood from your heart throughout your body. Hypertension forces the heart to work harder to pump blood and may cause the arteries tobecome narrow or stiff. Understanding blood pressure readings Your personal target blood pressure may vary depending on your medical conditions, your age, and other factors. A blood pressure reading includes a higher number over a lower number. Ideally, your blood pressure should be below 120/80. You should know that: The first, or top, number is called the systolic pressure. It is a measure of the pressure in your arteries as your heart beats. The second, or bottom number, is called the diastolic pressure. It is a measure of the pressure in your arteries as the heart relaxes. Blood pressure is classified into four stages. Based on your blood pressure reading, your health care provider may use the following stages to determine what type of treatment you need, if any. Systolic pressure and diastolicpressure are measured in a unit called mmHg. Normal Systolic pressure: below 123456. Diastolic pressure: below 80. Elevated Systolic pressure: Q000111Q. Diastolic pressure: below 80. Hypertension stage 1 Systolic pressure: 0000000. Diastolic pressure: [id number]. Hypertension stage 2 Systolic pressure: [id number] or above. Diastolic pressure: 90 or above. How can this condition affect me? Managing your hypertension is an important responsibility. Over time, hypertension can damage the arteries and decrease blood flow to important parts of the body, including the brain, heart, and kidneys. Having untreated or uncontrolled hypertension can lead to: A heart attack. A stroke. A weakened blood vessel (aneurysm). Heart failure. Kidney damage. Eye damage. Metabolic syndrome. Memory and concentration problems. Vascular dementia. What actions can I take to manage this  condition? Hypertension can be managed by making lifestyle changes and possibly by taking medicines. Your health care provider will help you make a plan to bring yourblood pressure within a normal range. Nutrition  Eat a diet that is high in fiber and potassium, and low in salt (sodium), added sugar, and fat. An example eating plan is called the Dietary Approaches to Stop Hypertension (DASH) diet. To eat this way: Eat plenty of fresh fruits and vegetables. Try to fill one-half of your plate at each meal with fruits and vegetables. Eat whole grains, such as whole-wheat pasta, brown rice, or whole-grain bread. Fill about one-fourth of your plate with whole grains. Eat low-fat dairy products. Avoid fatty cuts of meat, processed or cured meats, and poultry with skin. Fill about one-fourth of your plate with lean proteins such as fish, chicken without skin, beans, eggs, and tofu. Avoid pre-made and processed foods. These tend to be higher in sodium, added sugar, and fat. Reduce your daily sodium intake.  Most people with hypertension should eat less than 1,500 mg of sodium a day.  Lifestyle  Work with your health care provider to maintain a healthy body weight or to lose weight. Ask what an ideal weight is for you. Get at least 30 minutes of exercise that causes your heart to beat faster (aerobic exercise) most days of the week. Activities may include walking, swimming, or biking. Include exercise to strengthen your muscles (resistance exercise), such as weight lifting, as part of your weekly exercise routine. Try to do these types of exercises for 30 minutes at least 3 days a week. Do not use any products that contain nicotine or tobacco, such as cigarettes, e-cigarettes, and chewing tobacco. If you need help quitting, ask your health care provider. Control any long-term (chronic) conditions you have, such as high cholesterol or diabetes. Identify your sources of stress and find ways to manage stress.  This may include meditation, deep breathing, or making time for fun activities.  Alcohol use Do not drink alcohol if: Your health care provider tells you not to drink. You are pregnant, may be pregnant, or are planning to become pregnant. If you drink alcohol: Limit how much you use to: 0-1 drink a day for women. 0-2 drinks a day for men. Be aware of how much alcohol is in your drink. In the [place] one drink equals one 12 oz bottle of beer (355 mL), one 5 oz glass of wine (148 mL), or one 1 oz glass of hard liquor (44 mL). Medicines Your health care provider may prescribe medicine if lifestyle changes are not enough to get your blood pressure under control and if: Your systolic blood pressure is 130 or higher. Your diastolic blood pressure is 80 or higher. Take medicines only as told by your health care provider. Follow the directions carefully. Blood pressure medicines must be taken as told by your health care provider. The medicine does not work as well when you skip doses. Skippingdoses also puts you at risk for problems. Monitoring Before you monitor your blood pressure: Do not smoke, drink caffeinated beverages, or exercise within 30 minutes before taking a measurement. Use the bathroom and empty your bladder (urinate). Sit quietly for at least 5 minutes before taking measurements. Monitor your blood pressure at home as told by your health care provider. To do this: Sit with your back straight and supported. Place your feet flat on the floor. Do not cross your legs. Support your arm on a flat surface, such as a table. Make sure your upper arm is at heart level. Each time you measure, take two or three readings one minute apart and record the results. You may also need to have your blood pressure checked regularly by your healthcare provider. General information Talk with your health care provider about your diet, exercise habits, and other lifestyle factors that may be contributing  to hypertension. Review all the medicines you take with your health care provider because there may be side effects or interactions. Keep all visits as told by your health care provider. Your health care provider can help you create and adjust your plan for managing your high blood pressure. Where to find more information National Heart, Lung, and Blood Institute: https://wilson-eaton.com/ American Heart Association: www.heart.org Contact a health care provider if: You think you are having a reaction to medicines you have taken. You have repeated (recurrent) headaches. You feel dizzy. You have swelling in your ankles. You have trouble with your vision. Get help right  away if: You develop a severe headache or confusion. You have unusual weakness or numbness, or you feel faint. You have severe pain in your chest or abdomen. You vomit repeatedly. You have trouble breathing. These symptoms may represent a serious problem that is an emergency. Do not wait to see if the symptoms will go away. Get medical help right away. Call your local emergency services (911 in the [place] Do not drive yourself to the hospital. Summary Hypertension is when the force of blood pumping through your arteries is too strong. If this condition is not controlled, it may put you at risk for serious complications. Your personal target blood pressure may vary depending on your medical conditions, your age, and other factors. For most people, a normal blood pressure is less than 120/80. Hypertension is managed by lifestyle changes, medicines, or both. Lifestyle changes to help manage hypertension include losing weight, eating a healthy, low-sodium diet, exercising more, stopping smoking, and limiting alcohol. This information is not intended to replace advice given to you by your health care provider. Make sure you discuss any questions you have with your healthcare provider. Document Revised: 09/16/2019 Document Reviewed:  07/12/2019 Elsevier Patient Education  2022 [place],   [name], MD [place] 03/20/21 7:04 PM

## 2021-03-20 NOTE — Patient Instructions (Addendum)
Swelling may be due to multiple causes. Try increasing hctz to '25mg'$  each day, decrease amlodipine to 5 mg each day. Keep up the good work with watching diet, and increasing walking should help with weight management as well. Let me know how things are going in the next few weeks.   Diet and exercise should help cholesterol levels as well. Return for fasting labs in next 2-3 weeks. Depending on levels can consider coronary calcium scoring to decide on cholesterol med.   Peripheral Edema  Peripheral edema is swelling that is caused by a buildup of fluid. Peripheral edema most often affects the lower legs, ankles, and feet. It can also develop in the arms, hands, and face. The area of the body that has peripheral edema will look swollen. It may also feel heavy or warm. Your clothes may start to feel tight. Pressing on the area may make a temporary dent in your skin. Youmay not be able to move your swollen arm or leg as much as usual. There are many causes of peripheral edema. It can happen because of a complication of other conditions such as congestive heart failure, kidney disease, or a problem with your blood circulation. It also can be a side effect of certain medicines or because of an infection. It often happens to womenduring pregnancy. Sometimes, the cause is not known. Follow these instructions at home: Managing pain, stiffness, and swelling  Raise (elevate) your legs while you are sitting or lying down. Move around often to prevent stiffness and to lessen swelling. Do not sit or stand for long periods of time. Wear support stockings as told by your health care provider.  Medicines Take over-the-counter and prescription medicines only as told by your health care provider. Your health care provider may prescribe medicine to help your body get rid of excess water (diuretic). General instructions Pay attention to any changes in your symptoms. Follow instructions from your health care provider  about limiting salt (sodium) in your diet. Sometimes, eating less salt may reduce swelling. Moisturize skin daily to help prevent skin from cracking and draining. Keep all follow-up visits as told by your health care provider. This is important. Contact a health care provider if you have: A fever. Edema that starts suddenly or is getting worse, especially if you are pregnant or have a medical condition. Swelling in only one leg. Increased swelling, redness, or pain in one or both of your legs. Drainage or sores at the area where you have edema. Get help right away if you: Develop shortness of breath, especially when you are lying down. Have pain in your chest or abdomen. Feel weak. Feel faint. Summary Peripheral edema is swelling that is caused by a buildup of fluid. Peripheral edema most often affects the lower legs, ankles, and feet. Move around often to prevent stiffness and to lessen swelling. Do not sit or stand for long periods of time. Pay attention to any changes in your symptoms. Contact a health care provider if you have edema that starts suddenly or is getting worse, especially if you are pregnant or have a medical condition. Get help right away if you develop shortness of breath, especially when lying down. This information is not intended to replace advice given to you by your health care provider. Make sure you discuss any questions you have with your healthcare provider. Document Revised: 05/05/2018 Document Reviewed: 05/05/2018 Elsevier Patient Education  2022 [place] Your Hypertension Hypertension, also called high blood pressure, is  when the force of the blood pressing against the walls of the arteries is too strong. Arteries are blood vessels that carry blood from your heart throughout your body. Hypertension forces the heart to work harder to pump blood and may cause the arteries tobecome narrow or stiff. Understanding blood pressure readings Your  personal target blood pressure may vary depending on your medical conditions, your age, and other factors. A blood pressure reading includes a higher number over a lower number. Ideally, your blood pressure should be below 120/80. You should know that: The first, or top, number is called the systolic pressure. It is a measure of the pressure in your arteries as your heart beats. The second, or bottom number, is called the diastolic pressure. It is a measure of the pressure in your arteries as the heart relaxes. Blood pressure is classified into four stages. Based on your blood pressure reading, your health care provider may use the following stages to determine what type of treatment you need, if any. Systolic pressure and diastolicpressure are measured in a unit called mmHg. Normal Systolic pressure: below 123456. Diastolic pressure: below 80. Elevated Systolic pressure: [id number]. Diastolic pressure: below 80. Hypertension stage 1 Systolic pressure: 0000000. Diastolic pressure: [id number]. Hypertension stage 2 Systolic pressure: [id number] or above. Diastolic pressure: 90 or above. How can this condition affect me? Managing your hypertension is an important responsibility. Over time, hypertension can damage the arteries and decrease blood flow to important parts of the body, including the brain, heart, and kidneys. Having untreated or uncontrolled hypertension can lead to: A heart attack. A stroke. A weakened blood vessel (aneurysm). Heart failure. Kidney damage. Eye damage. Metabolic syndrome. Memory and concentration problems. Vascular dementia. What actions can I take to manage this condition? Hypertension can be managed by making lifestyle changes and possibly by taking medicines. Your health care provider will help you make a plan to bring yourblood pressure within a normal range. Nutrition  Eat a diet that is high in fiber and potassium, and low in salt (sodium), added sugar, and fat. An  example eating plan is called the Dietary Approaches to Stop Hypertension (DASH) diet. To eat this way: Eat plenty of fresh fruits and vegetables. Try to fill one-half of your plate at each meal with fruits and vegetables. Eat whole grains, such as whole-wheat pasta, brown rice, or whole-grain bread. Fill about one-fourth of your plate with whole grains. Eat low-fat dairy products. Avoid fatty cuts of meat, processed or cured meats, and poultry with skin. Fill about one-fourth of your plate with lean proteins such as fish, chicken without skin, beans, eggs, and tofu. Avoid pre-made and processed foods. These tend to be higher in sodium, added sugar, and fat. Reduce your daily sodium intake. Most people with hypertension should eat less than 1,500 mg of sodium a day.  Lifestyle  Work with your health care provider to maintain a healthy body weight or to lose weight. Ask what an ideal weight is for you. Get at least 30 minutes of exercise that causes your heart to beat faster (aerobic exercise) most days of the week. Activities may include walking, swimming, or biking. Include exercise to strengthen your muscles (resistance exercise), such as weight lifting, as part of your weekly exercise routine. Try to do these types of exercises for 30 minutes at least 3 days a week. Do not use any products that contain nicotine or tobacco, such as cigarettes, e-cigarettes, and chewing tobacco. If you need help quitting, ask your health  care provider. Control any long-term (chronic) conditions you have, such as high cholesterol or diabetes. Identify your sources of stress and find ways to manage stress. This may include meditation, deep breathing, or making time for fun activities.  Alcohol use Do not drink alcohol if: Your health care provider tells you not to drink. You are pregnant, may be pregnant, or are planning to become pregnant. If you drink alcohol: Limit how much you use to: 0-1 drink a day for  women. 0-2 drinks a day for men. Be aware of how much alcohol is in your drink. In the [place] one drink equals one 12 oz bottle of beer (355 mL), one 5 oz glass of wine (148 mL), or one 1 oz glass of hard liquor (44 mL). Medicines Your health care provider may prescribe medicine if lifestyle changes are not enough to get your blood pressure under control and if: Your systolic blood pressure is 130 or higher. Your diastolic blood pressure is 80 or higher. Take medicines only as told by your health care provider. Follow the directions carefully. Blood pressure medicines must be taken as told by your health care provider. The medicine does not work as well when you skip doses. Skippingdoses also puts you at risk for problems. Monitoring Before you monitor your blood pressure: Do not smoke, drink caffeinated beverages, or exercise within 30 minutes before taking a measurement. Use the bathroom and empty your bladder (urinate). Sit quietly for at least 5 minutes before taking measurements. Monitor your blood pressure at home as told by your health care provider. To do this: Sit with your back straight and supported. Place your feet flat on the floor. Do not cross your legs. Support your arm on a flat surface, such as a table. Make sure your upper arm is at heart level. Each time you measure, take two or three readings one minute apart and record the results. You may also need to have your blood pressure checked regularly by your healthcare provider. General information Talk with your health care provider about your diet, exercise habits, and other lifestyle factors that may be contributing to hypertension. Review all the medicines you take with your health care provider because there may be side effects or interactions. Keep all visits as told by your health care provider. Your health care provider can help you create and adjust your plan for managing your high blood pressure. Where to find more  information National Heart, Lung, and Blood Institute: https://wilson-eaton.com/ American Heart Association: www.heart.org Contact a health care provider if: You think you are having a reaction to medicines you have taken. You have repeated (recurrent) headaches. You feel dizzy. You have swelling in your ankles. You have trouble with your vision. Get help right away if: You develop a severe headache or confusion. You have unusual weakness or numbness, or you feel faint. You have severe pain in your chest or abdomen. You vomit repeatedly. You have trouble breathing. These symptoms may represent a serious problem that is an emergency. Do not wait to see if the symptoms will go away. Get medical help right away. Call your local emergency services (911 in the [place] Do not drive yourself to the hospital. Summary Hypertension is when the force of blood pumping through your arteries is too strong. If this condition is not controlled, it may put you at risk for serious complications. Your personal target blood pressure may vary depending on your medical conditions, your age, and other factors. For most people, a  normal blood pressure is less than 120/80. Hypertension is managed by lifestyle changes, medicines, or both. Lifestyle changes to help manage hypertension include losing weight, eating a healthy, low-sodium diet, exercising more, stopping smoking, and limiting alcohol. This information is not intended to replace advice given to you by your health care provider. Make sure you discuss any questions you have with your healthcare provider. Document Revised: 09/16/2019 Document Reviewed: 07/12/2019 Elsevier Patient Education  2022 [employer].

## 2021-03-25 ENCOUNTER — Telehealth: Payer: Self-pay

## 2021-03-25 NOTE — Telephone Encounter (Signed)
Patient called and spoke with this nurse stated at her last visit she believed she was told that she needed another MRI of her bilateral breast completed.  She states that she has neve been scheduled and wanted to know  is she still supposed to have that.  This nurse apologized and advised that MD will be made aware and I will contact her with the recommendations from the provider.

## 2021-03-25 NOTE — Telephone Encounter (Signed)
This nurse left a message for this patient related to MR of Bilateral Breast.  No answer, this nurse left a message for patient to reach out to central scheduling to setup appointment for scan.  Provided the number.  Patient knows to call clinic with any questions, concerns or problems.

## 2021-04-03 ENCOUNTER — Telehealth: Payer: Self-pay | Admitting: Hematology

## 2021-04-03 NOTE — Telephone Encounter (Signed)
Left message with follow-up appointment per 8/10 staff message.

## 2021-04-09 ENCOUNTER — Ambulatory Visit (HOSPITAL_COMMUNITY)
Admission: RE | Admit: 2021-04-09 | Discharge: 2021-04-09 | Disposition: A | Discharge status: Home / Self Care | Payer: 59 | Source: Ambulatory Visit | Attending: Nurse Practitioner | Admitting: Nurse Practitioner

## 2021-04-09 DIAGNOSIS — C50411 Malignant neoplasm of upper-outer quadrant of right female breast: Secondary | ICD-10-CM | POA: Insufficient documentation

## 2021-04-09 DIAGNOSIS — Z170 Estrogen receptor positive status [ER+]: Secondary | ICD-10-CM | POA: Diagnosis present

## 2021-04-09 MED ORDER — GADOBUTROL 1 MMOL/ML IV SOLN
9.0000 mL | Freq: Once | INTRAVENOUS | Status: AC | PRN
  Administered 2021-04-09: 9 mL via INTRAVENOUS

## 2021-04-11 ENCOUNTER — Encounter: Payer: Self-pay | Admitting: Hematology

## 2021-04-11 ENCOUNTER — Inpatient Hospital Stay: Payer: 59 | Attending: Hematology | Admitting: Hematology

## 2021-04-11 ENCOUNTER — Other Ambulatory Visit: Payer: Self-pay

## 2021-04-11 VITALS — BP 153/100 | HR 103 | Temp 98.4°F | Wt 209.8 lb

## 2021-04-11 DIAGNOSIS — R635 Abnormal weight gain: Secondary | ICD-10-CM | POA: Diagnosis not present

## 2021-04-11 DIAGNOSIS — I10 Essential (primary) hypertension: Secondary | ICD-10-CM | POA: Insufficient documentation

## 2021-04-11 DIAGNOSIS — F32A Depression, unspecified: Secondary | ICD-10-CM | POA: Insufficient documentation

## 2021-04-11 DIAGNOSIS — Z8616 Personal history of COVID-19: Secondary | ICD-10-CM | POA: Diagnosis not present

## 2021-04-11 DIAGNOSIS — Z170 Estrogen receptor positive status [ER+]: Secondary | ICD-10-CM | POA: Diagnosis not present

## 2021-04-11 DIAGNOSIS — Z1231 Encounter for screening mammogram for malignant neoplasm of breast: Secondary | ICD-10-CM

## 2021-04-11 DIAGNOSIS — C50411 Malignant neoplasm of upper-outer quadrant of right female breast: Secondary | ICD-10-CM | POA: Insufficient documentation

## 2021-04-11 NOTE — Progress Notes (Signed)
[place]   Telephone:([phone] Fax:(336) [phone]   Clinic Follow up Note   Patient Care Team: [name], MD as PCP - General (Family Medicine) Brien Few, MD as Consulting Physician (Obstetrics and Gynecology) Artelia Laroche, CNM as Midwife (Obstetrics and Gynecology) Excell Seltzer, MD (Inactive) as Consulting Physician (General Surgery) [name], MD as Consulting Physician (Hematology) Gery Pray, MD as Consulting Physician (Radiation Oncology)  Date of Service:  04/11/2021  CHIEF COMPLAINT: f/u of right breast cancer  SUMMARY OF ONCOLOGIC HISTORY: Oncology History Overview Note  Breast cancer of upper-outer quadrant of right female breast [facility])   Staging form: Breast, AJCC 7th Edition   - Clinical stage from 04/23/2016: Stage IA (T1c(m), N0, M0) - Signed by [name], MD on 04/30/2016   - Pathologic stage from 05/27/2016: Stage IIA (T2(m), N0, cM0) - Unsigned    Breast cancer of upper-outer quadrant of right female breast ([place])  04/17/2016 Mammogram   Mammogram and US showed a 2.2cm cyst at 12:00, a 1.6cm lobulated mass at 9:00 and a 72m mass at 10:00, axilla was negative    04/23/2016 Initial Diagnosis   [place]   04/23/2016 Initial Biopsy   Core needle biopsy of right breast masses at 10:00 and 9:30 positions both showed invasive and insitu mammary carcinoma    04/23/2016 Receptors her2   ER 90%+, PR 80-90%+, HER2-, KI67 10-15%   05/12/2016 Procedure   Genetic testing: Negative. Genes analyzed: ATM, BAP1, BARD1, BRCA1, BRCA2, BRIP1, CDH1, CDK4, CDKN2A, CHEK2, EPCAM, FANCC, MITF, MLH1, MSH2, MSH6, NBN, PALB2, PMS2, PTEN, RAD51C, RAD51D, TP53, and XRCC2   05/27/2016 Surgery   Right breast mastectomy and sentinel lymph node biopsy ([place])   05/27/2016 Pathology Results   Right breast mastectomy showed multifocal invasive and in situ lobular carcinoma, 3.2, 0.9 and 0.9 cm, G1  margins were negative, 3 sentinel lymph nodes were negative.    05/27/2016 Oncotype testing   RS 18, which predicts 10 year distant recurrence risk of 12% with tamoxifen (intermediate risk)    11/23/2016 -  Anti-estrogen oral therapy   Tamoxifen 20 mg daily, she started with 151mdaily for the first month    04/27/2018 Mammogram   Benign      CURRENT THERAPY:  Tamoxifen daily, starting 11/23/2016 with 1080mor the first month, then 20 mg. Held starting 12/06/18 due to weight gain and uncontrolled HTN. Due to no change she restarted in 12/2018.   INTERVAL HISTORY:  Caitlin Monroe here for a follow up of breast cancer. She was last seen by me on 01/30/20 and by NP Lacie in the interim. She presents to the clinic alone. She reports she is doing well overall. Her BP was elevated today, and she notes she is working on this, as well as managing her weight. She notes she is walking more and recently bought a stand-up desk. 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  She notes she got Covid in late May 2022, over Labor Day weekend. She reports it was very mild. She notes she is considering getting a nipple tattoo.   All other systems were reviewed with the patient and are negative.  MEDICAL HISTORY:  Past Medical History:  Diagnosis Date   Anxiety    [place]ast [place] of [place] of [place]   Depression    Family history of breast cancer    Family history of  prostate cancer    Hypertension    "because of stress" (05/27/2016)   PONV (postoperative nausea and vomiting)    Seasonal allergies     SURGICAL HISTORY: Past Surgical History:  Procedure Laterality Date   ABDOMINAL HYSTERECTOMY  Aug 2011   Da Vinci TAH for fibroids   BREAST BIOPSY  03/2016   FOOT NEUROMA SURGERY Right 2000?   MASTECTOMY COMPLETE / SIMPLE W/ SENTINEL NODE BIOPSY Right 05/27/2016   MASTECTOMY W/ SENTINEL NODE BIOPSY Right 05/27/2016   Procedure: RIGHT TOTAL  MASTECTOMY WITH AXILLARY SENTINEL LYMPH NODE BIOPSY;  Surgeon: Excell Seltzer, MD;  Location: [facility];  Service: General;  Laterality: Right;   TONSILLECTOMY     WISDOM TOOTH EXTRACTION  ~ 1985    I have reviewed the social history and family history with the patient and they are unchanged from previous note.  ALLERGIES:  has No Known Allergies.  MEDICATIONS:  Current Outpatient Medications  Medication Sig Dispense Refill   amLODipine (NORVASC) 5 MG tablet Take 1 tablet (5 mg total) by mouth daily. 90 tablet 1   amphetamine-dextroamphetamine (ADDERALL) 10 MG tablet 15 mg.  0   cholecalciferol (VITAMIN D) 1000 UNITS tablet Take 6,000 Units by mouth daily.      hydrochlorothiazide (HYDRODIURIL) 25 MG tablet Take 1 tablet (25 mg total) by mouth daily. 90 tablet 3   ibuprofen (ADVIL,MOTRIN) 200 MG tablet Take 200 mg by mouth every 6 (six) hours as needed for mild pain.     L-Tyrosine 500 MG CAPS Take 1 capsule by mouth daily.     Lactobacillus Rhamnosus, GG, (CULTURELLE PO) Take by mouth.     loratadine (CLARITIN) 10 MG tablet Take 10 mg by mouth daily.     magnesium 30 MG tablet Take 500 mg by mouth daily.      Omega-3 Fatty Acids (FISH OIL PO) Take 1 capsule by mouth daily.      propranolol (INDERAL) 10 MG tablet TK 1 T PO BID PRN FOR ANXIETY OR 20 MINUTES PRIOR TO KNOWN ANXIETY PROVOKING EVENT NTE 2/D     tamoxifen (NOLVADEX) 20 MG tablet Take 1 tablet by mouth daily. 90 tablet 2   valACYclovir (VALTREX) 1000 MG tablet Take 500-1,000 mg by mouth daily.     No current facility-administered medications for this visit.    PHYSICAL EXAMINATION: ECOG PERFORMANCE STATUS: 0 - Asymptomatic  Vitals:   04/11/21 0904  BP: (!) 153/100  Pulse: (!) 103  Temp: 98.4 F (36.9 C)  SpO2: 95%   Wt Readings from Last 3 Encounters:  04/11/21 209 lb 12.8 oz (95.2 kg)  03/20/21 206 lb 6.4 oz (93.6 kg)  06/29/20 199 lb 6.4 oz (90.4 kg)     GENERAL:alert, no distress and comfortable SKIN: skin  color, texture, turgor are normal, no rashes or significant lesions EYES: normal, Conjunctiva are pink and non-injected, sclera clear  NECK: supple, thyroid normal size, non-tender, without nodularity LYMPH:  no palpable lymphadenopathy in the cervical, axillary  LUNGS: clear to auscultation and percussion with normal breathing effort HEART: regular rate & rhythm and no murmurs and no lower extremity edema ABDOMEN:abdomen soft, non-tender and normal bowel sounds Musculoskeletal:no cyanosis of digits and no clubbing  NEURO: alert & oriented x 3 with fluent speech, no focal motor/sensory deficits BREAST: s/p left mastectomy; No palpable mass, nodules or adenopathy bilaterally. Breast exam benign.   LABORATORY DATA:  I have reviewed the data as listed CBC Latest Ref Rng & Units 09/30/2019 03/18/2019 06/14/2018  WBC 4.0 - 10.5 K/uL 10.2 6.4 6.8  Hemoglobin 12.0 - 15.0 g/dL 13.9 13.6 13.3  Hematocrit 36.0 - 46.0 % 39.0 39.1 38.3  Platelets 150 - 400 K/uL 366 307 309     CMP Latest Ref Rng & Units 06/29/2020 09/30/2019 03/18/2019  Glucose 65 - 99 mg/dL 105(H) 98 109(H)  BUN 6 - 24 mg/dL '18 20 16  ' Creatinine 0.57 - 1.00 mg/dL 0.92 0.95 0.87  Sodium 134 - 144 mmol/L 138 140 138  Potassium 3.5 - 5.2 mmol/L 4.2 4.0 4.3  Chloride 96 - 106 mmol/L 101 104 107  CO2 20 - 29 mmol/L 23 25 21(L)  Calcium 8.7 - 10.2 mg/dL 9.8 9.0 9.0  Total Protein 6.0 - 8.5 g/dL 7.0 7.6 6.9  Total Bilirubin 0.0 - 1.2 mg/dL 0.3 0.4 0.3  Alkaline Phos 44 - 121 IU/L 52 48 37(L)  AST 0 - 40 IU/L '20 17 18  ' ALT 0 - 32 IU/L '18 20 18      ' RADIOGRAPHIC STUDIES: I have personally reviewed the radiological images as listed and agreed with the findings in the report. MR BREAST BILATERAL W WO CONTRAST INC CAD  Result Date: 04/10/2021 CLINICAL DATA:  History of RIGHT breast cancer status post mastectomy in 2017. family history of breast cancer. LABS:  Not performed at imaging center. EXAM: BILATERAL BREAST MRI WITH AND WITHOUT  CONTRAST TECHNIQUE: Multiplanar, multisequence MR images of both breasts were obtained prior to and following the intravenous administration of 9 ml of Gadavist Three-dimensional MR images were rendered by post-processing of the original MR data on an independent workstation. The three-dimensional MR images were interpreted, and findings are reported in the following complete MRI report for this study. Three dimensional images were evaluated at the independent interpreting workstation using the DynaCAD thin client. COMPARISON:  Previous exams including breast MRI dated 12/27/2019. FINDINGS: Breast composition: c. Heterogeneous fibroglandular tissue. Background parenchymal enhancement: Moderate. Right chest wall : Status post mastectomy. No suspicious enhancing mass, non-mass enhancement or secondary signs of malignancy. Left breast: No suspicious enhancing mass, non-mass enhancement or secondary signs of malignancy. Lymph nodes: No abnormal appearing lymph nodes. Ancillary findings:  None. IMPRESSION: 1. No MRI evidence of malignancy. 2. Status post RIGHT mastectomy. RECOMMENDATION: Continued annual screening mammography and breast MRIs. Patient will be due for next screening mammogram in November of 2022. BI-RADS CATEGORY  2: Benign. Electronically Signed   By: Franki Cabot M.D.   On: 04/10/2021 11:08     ASSESSMENT & PLAN:  Caitlin Monroe is a 55 y.o. female with   1. Breast cancer of upper outer quadrant of right breast, invasive lobular carcinoma, mpT2N0M0, stage IIA, ER+/PR+/HER2-, G1, RS 18  -She was diagnosed in 03/2016. She was treated with a right mastectomy. She had multifocal disease, surgical margins were negative. Nodes were negative. -Given she was node negative, post-mastectomy radiation was not recommended.  -She started anti-estrogen therapy with Tamoxifen in 11/23/2016. For the first month she used 56m daily. She is tolerating full dose 242mdaily well. She held Tamoxifen only for month  of 11/2018 due to her recent weight gain and worsened HTN. No change occurred so she restarted. She continues to tolerate well with minimal hot flashes. Her BP still not well controlled and she feels UTIs are related but she wishes to continue Tamoxifen.  -Her 05/2018 TSH and Estradial showed she was still premenopausal. Plan to switch her to AI once postmenopausal.  -left screening mammogram on 07/09/20 and breast MRI  on 04/09/21 were both negative. -She is clinically doing well. There is no clinical concern for recurrence. -She did not have lab work done today. She has standing orders in place for [facility], CMP, and lipid panel. I added CBC for her to complete in the near future. -Continue Tamoxifen  -f/u in 6 months    2. Bone health -We previously discussed the effect on bone density from tamoxifen or aromatase inhibitor -I encouraged her to take calcium and vitamin D   3. Hypertension, depression, weight gain  -She has stopped Wellbutrin, and has stopped Zoloft. She is on Effexor. Her father passed away in 10-08-2018 and has been grieving but not depressed. Her mood has been managed with therapy and mood clinic for counseling. 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  -Her weight is going up despite trying to lose it. I discussed the benefits of our weight management clinic in assistance of weight loss. She is interested, and I will refer her. -She has been working on managing her BP with her PCP, Dr. Carlota Raspberry   4. Recurrent UTI   -Well managed with Keflex as needed, Cranberry pill and Azo as needed, and to sees urologist Dr. Tresa Moore. No full UTI since.   5. Covid-19+ in late 12/2020 -mild symptoms, recovered well     Plan -referral to weight management clinic -Continue Tamoxifen  -Mammogram in 06/2021, ordered today  -labs and f/u in 6 months    No problem-specific Assessment & Plan notes found for this encounter.   Orders Placed This Encounter  Procedures   MM Digital  Screening Unilat L    Standing Status:   Future    Standing Expiration Date:   04/11/2022    Scheduling Instructions:     [facility]    Order Specific Question:   Reason for Exam (SYMPTOM  OR DIAGNOSIS REQUIRED)    Answer:   screening    Order Specific Question:   Is the patient pregnant?    Answer:   No    Order Specific Question:   Preferred imaging location?    Answer:   External   CBC with Differential (Cancer Center Only)    Standing Status:   Standing    Number of Occurrences:   2    Standing Expiration Date:   04/11/2022   Amb Ref to Medical Weight Management    Referral Priority:   Routine    Referral Type:   Consultation    Number of Visits Requested:   1   All questions were answered. The patient knows to call the clinic with any problems, questions or concerns. No barriers to learning was detected. The total time spent in the appointment was 30 minutes.     [name], MD 04/11/2021   I, Wilburn Mylar, am acting as scribe for [name], MD.   I have reviewed the above documentation for accuracy and completeness, and I agree with the above.

## 2021-04-24 ENCOUNTER — Other Ambulatory Visit: Payer: Self-pay

## 2021-04-24 ENCOUNTER — Inpatient Hospital Stay: Payer: 59

## 2021-04-24 DIAGNOSIS — C50411 Malignant neoplasm of upper-outer quadrant of right female breast: Secondary | ICD-10-CM | POA: Diagnosis not present

## 2021-04-24 DIAGNOSIS — Z170 Estrogen receptor positive status [ER+]: Secondary | ICD-10-CM

## 2021-04-24 LAB — COMPREHENSIVE METABOLIC PANEL
ALT: 28 U/L (ref 0–44)
AST: 20 U/L (ref 15–41)
Albumin: 4.1 g/dL (ref 3.5–5.0)
Alkaline Phosphatase: 56 U/L (ref 38–126)
Anion gap: 12 (ref 5–15)
BUN: 16 mg/dL (ref 6–20)
CO2: 22 mmol/L (ref 22–32)
Calcium: 9.1 mg/dL (ref 8.9–10.3)
Chloride: 104 mmol/L (ref 98–111)
Creatinine, Ser: 0.86 mg/dL (ref 0.44–1.00)
GFR, Estimated: >60 mL/min (ref >60)
Glucose, Bld: 107 mg/dL — ABNORMAL HIGH (ref 70–99)
Potassium: 3.9 mmol/L (ref 3.5–5.1)
Sodium: 138 mmol/L (ref 135–145)
Total Bilirubin: 0.4 mg/dL (ref 0.3–1.2)
Total Protein: 7.2 g/dL (ref 6.5–8.1)

## 2021-04-24 LAB — CBC WITH DIFFERENTIAL/PLATELET
Abs Immature Granulocytes: 0.01 10*3/uL (ref 0.00–0.07)
Basophils Absolute: 0 10*3/uL (ref 0.0–0.1)
Basophils Relative: 1 %
Eosinophils Absolute: 0.1 10*3/uL (ref 0.0–0.5)
Eosinophils Relative: 1 %
HCT: 40.1 % (ref 36.0–46.0)
Hemoglobin: 14 g/dL (ref 12.0–15.0)
Immature Granulocytes: 0 %
Lymphocytes Relative: 38 %
Lymphs Abs: 2.2 10*3/uL (ref 0.7–4.0)
MCH: 31.7 pg (ref 26.0–34.0)
MCHC: 34.9 g/dL (ref 30.0–36.0)
MCV: 90.7 fL (ref 80.0–100.0)
Monocytes Absolute: 0.4 10*3/uL (ref 0.1–1.0)
Monocytes Relative: 7 %
Neutro Abs: 3 10*3/uL (ref 1.7–7.7)
Neutrophils Relative %: 53 %
Platelets: 326 10*3/uL (ref 150–400)
RBC: 4.42 MIL/uL (ref 3.87–5.11)
RDW: 11.8 % (ref 11.5–15.5)
WBC: 5.6 10*3/uL (ref 4.0–10.5)
nRBC: 0 % (ref 0.0–0.2)

## 2021-04-25 LAB — FOLLICLE STIMULATING HORMONE: FSH: 21.3 m[IU]/mL

## 2021-04-27 ENCOUNTER — Encounter: Payer: Self-pay | Admitting: Hematology

## 2021-06-18 ENCOUNTER — Encounter: Payer: Self-pay | Admitting: Hematology

## 2021-07-10 ENCOUNTER — Other Ambulatory Visit: Payer: Self-pay | Admitting: Internal Medicine

## 2021-07-10 DIAGNOSIS — E785 Hyperlipidemia, unspecified: Secondary | ICD-10-CM

## 2021-07-15 ENCOUNTER — Encounter: Payer: Self-pay | Admitting: Hematology

## 2021-08-06 ENCOUNTER — Other Ambulatory Visit: Payer: 59

## 2021-09-09 ENCOUNTER — Other Ambulatory Visit: Payer: Self-pay | Admitting: Hematology

## 2021-09-09 DIAGNOSIS — Z170 Estrogen receptor positive status [ER+]: Secondary | ICD-10-CM

## 2021-09-09 DIAGNOSIS — C50411 Malignant neoplasm of upper-outer quadrant of right female breast: Secondary | ICD-10-CM

## 2021-10-07 ENCOUNTER — Other Ambulatory Visit: Payer: Self-pay

## 2021-10-07 ENCOUNTER — Inpatient Hospital Stay: Payer: Managed Care, Other (non HMO) | Attending: Hematology | Admitting: Hematology

## 2021-10-07 ENCOUNTER — Inpatient Hospital Stay: Payer: Managed Care, Other (non HMO)

## 2021-10-07 ENCOUNTER — Encounter: Payer: Self-pay | Admitting: Hematology

## 2021-10-07 DIAGNOSIS — Z79899 Other long term (current) drug therapy: Secondary | ICD-10-CM | POA: Diagnosis not present

## 2021-10-07 DIAGNOSIS — F32A Depression, unspecified: Secondary | ICD-10-CM | POA: Diagnosis not present

## 2021-10-07 DIAGNOSIS — E663 Overweight: Secondary | ICD-10-CM | POA: Diagnosis not present

## 2021-10-07 DIAGNOSIS — Z170 Estrogen receptor positive status [ER+]: Secondary | ICD-10-CM | POA: Insufficient documentation

## 2021-10-07 DIAGNOSIS — I10 Essential (primary) hypertension: Secondary | ICD-10-CM

## 2021-10-07 DIAGNOSIS — C50411 Malignant neoplasm of upper-outer quadrant of right female breast: Secondary | ICD-10-CM | POA: Insufficient documentation

## 2021-10-07 DIAGNOSIS — Z8616 Personal history of COVID-19: Secondary | ICD-10-CM | POA: Diagnosis not present

## 2021-10-07 LAB — CMP (CANCER CENTER ONLY)
ALT: 18 U/L (ref 0–44)
AST: 19 U/L (ref 15–41)
Albumin: 4.4 g/dL (ref 3.5–5.0)
Alkaline Phosphatase: 54 U/L (ref 38–126)
Anion gap: 9 (ref 5–15)
BUN: 17 mg/dL (ref 6–20)
CO2: 24 mmol/L (ref 22–32)
Calcium: 9 mg/dL (ref 8.9–10.3)
Chloride: 105 mmol/L (ref 98–111)
Creatinine: 0.86 mg/dL (ref 0.44–1.00)
GFR, Estimated: >60 mL/min (ref >60)
Glucose, Bld: 117 mg/dL — ABNORMAL HIGH (ref 70–99)
Potassium: 4.1 mmol/L (ref 3.5–5.1)
Sodium: 138 mmol/L (ref 135–145)
Total Bilirubin: 0.6 mg/dL (ref 0.3–1.2)
Total Protein: 6.7 g/dL (ref 6.5–8.1)

## 2021-10-07 LAB — CBC WITH DIFFERENTIAL (CANCER CENTER ONLY)
Abs Immature Granulocytes: 0.01 10*3/uL (ref 0.00–0.07)
Basophils Absolute: 0 10*3/uL (ref 0.0–0.1)
Basophils Relative: 0 %
Eosinophils Absolute: 0.1 10*3/uL (ref 0.0–0.5)
Eosinophils Relative: 2 %
HCT: 40.7 % (ref 36.0–46.0)
Hemoglobin: 13.9 g/dL (ref 12.0–15.0)
Immature Granulocytes: 0 %
Lymphocytes Relative: 37 %
Lymphs Abs: 2.2 10*3/uL (ref 0.7–4.0)
MCH: 31.4 pg (ref 26.0–34.0)
MCHC: 34.2 g/dL (ref 30.0–36.0)
MCV: 92.1 fL (ref 80.0–100.0)
Monocytes Absolute: 0.5 10*3/uL (ref 0.1–1.0)
Monocytes Relative: 8 %
Neutro Abs: 3.2 10*3/uL (ref 1.7–7.7)
Neutrophils Relative %: 53 %
Platelet Count: 350 10*3/uL (ref 150–400)
RBC: 4.42 MIL/uL (ref 3.87–5.11)
RDW: 12.4 % (ref 11.5–15.5)
WBC Count: 6.1 10*3/uL (ref 4.0–10.5)
nRBC: 0 % (ref 0.0–0.2)

## 2021-10-07 MED ORDER — TAMOXIFEN CITRATE 20 MG PO TABS: 20.0000 mg | ORAL_TABLET | Freq: Every day | ORAL | 1 refills | Status: DC

## 2021-10-07 NOTE — Progress Notes (Signed)
[place]   Telephone:(336) [phone] Fax:(336) [phone]   Clinic Follow up Note   Patient Care Team: Michael Boston, MD as PCP - General (Internal Medicine) Brien Few, MD as Consulting Physician (Obstetrics and Gynecology) Artelia Laroche, CNM as Midwife (Obstetrics and Gynecology) [name], MD as Consulting Physician (Hematology) Gery Pray, MD as Consulting Physician (Radiation Oncology)  Date of Service:  10/07/2021  CHIEF COMPLAINT: f/u of right breast cancer  CURRENT THERAPY:  Tamoxifen daily, starting 11/23/2016. Held 11/1318-12/2018 due to weight gain and uncontrolled HTN.  ASSESSMENT & PLAN:  Caitlin Monroe is a 56 y.o. female with   1. Breast cancer of UOQ of right breast, invasive lobular carcinoma, mpT2N0M0, stage IIA, ER+/PR+/HER2-, G1, RS 18  -diagnosed in 03/2016, s/p right mastectomy. She had multifocal disease, surgical margins and nodes were negative.  -She started anti-estrogen therapy with Tamoxifen in 11/23/16. She is s/p hysterectomy, ovaries still in place. For the first month she used $Remove'10mg'hxmgbrJ$  daily. She continues to tolerate well with minimal hot flashes. Her BP still not well controlled and she feels UTIs are related but she wishes to continue Tamoxifen.  -Her 03/2021 [facility] showed she was still premenopausal. Plan to switch her to AI once postmenopausal.  -left screening mammogram on 07/15/21 and breast MRI on 04/09/21 were both negative. -She is clinically doing well. Labs reviewed, CBC is WNL. Physical exam was unremarkable. There is no clinical concern for recurrence. -Continue Tamoxifen  -f/u in 6 months    2. Bone health -We previously discussed the effect on bone density from tamoxifen or aromatase inhibitor -I encouraged her to take calcium and vitamin D   3. Hypertension, depression, overweight  -She has stopped Wellbutrin, and has stopped Zoloft. She is on Effexor. Her father passed away in 09-Oct-2018 and has been grieving but not  depressed. Her mood has been managed with therapy and mood clinic for counseling. 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  -she is on the wait list for the weight management clinic.I encouraged her to call them again. Her weight is stable, but she remains to be overweight and she is interested in weight loss with goal of 15lbs  -She has been working on managing her BP with her PCP, Dr. Carlota Raspberry   4. Recurrent UTI   -Well managed with Keflex as needed, Cranberry pill and Azo as needed, and to sees urologist Dr. Tresa Moore. No full UTI since.    5. Covid-19+ in late 12/2020 -mild symptoms, recovered well     Plan -Continue Tamoxifen  -breast MRI in 01/2022, ordered today -labs and f/u in 6 months -Mammogram due 06/2022, will order at next visit   No problem-specific Assessment & Plan notes found for this encounter.   SUMMARY OF ONCOLOGIC HISTORY: Oncology History Overview Note  Breast cancer of upper-outer quadrant of right female breast [facility])   Staging form: Breast, AJCC 7th Edition   - Clinical stage from 04/23/2016: Stage IA (T1c(m), N0, M0) - Signed by [name], MD on 04/30/2016   - Pathologic stage from 05/27/2016: Stage IIA (T2(m), N0, cM0) - Unsigned    Breast cancer of upper-outer quadrant of right female breast ([place])  04/17/2016 Mammogram   Mammogram and US showed a 2.2cm cyst at 12:00, a 1.6cm lobulated mass at 9:00 and a 52mm mass at 10:00, axilla was negative    04/23/2016 Initial Diagnosis   Breast cancer of upper-outer quadrant of right female breast ([place])   04/23/2016 Initial Biopsy  Core needle biopsy of right breast masses at 10:00 and 9:30 positions both showed invasive and insitu mammary carcinoma    04/23/2016 Receptors her2   ER 90%+, PR 80-90%+, HER2-, KI67 10-15%   05/12/2016 Procedure   Genetic testing: Negative. Genes analyzed: ATM, BAP1, BARD1, BRCA1, BRCA2, BRIP1, CDH1, CDK4, CDKN2A, CHEK2, EPCAM, FANCC, MITF, MLH1, MSH2, MSH6, NBN, PALB2, PMS2,  PTEN, RAD51C, RAD51D, TP53, and XRCC2   05/27/2016 Surgery   Right breast mastectomy and sentinel lymph node biopsy ([place])   05/27/2016 Pathology Results   Right breast mastectomy showed multifocal invasive and in situ lobular carcinoma, 3.2, 0.9 and 0.9 cm, G1 margins were negative, 3 sentinel lymph nodes were negative.    05/27/2016 Oncotype testing   RS 18, which predicts 10 year distant recurrence risk of 12% with tamoxifen (intermediate risk)    11/23/2016 -  Anti-estrogen oral therapy   Tamoxifen 20 mg daily, she started with $RemoveBefo'10mg'QbRXEUqboEm$  daily for the first month    04/27/2018 Mammogram   Benign      INTERVAL HISTORY:  Caitlin Monroe is here for a follow up of breast cancer. She was last seen by me on 04/11/21. She presents to the clinic alone. She reports she is doing well overall. She sent [place] a MyChart message in 05/2021 noting she joined a program called "[employer]" to help manage her weight. However, she notes today that she did not feel comfortable with the medications given through the program, so she discontinued.   All other systems were reviewed with the patient and are negative.  MEDICAL HISTORY:  Past Medical History:  Diagnosis Date   Anxiety    Breast cancer of upper-outer quadrant of right female breast ([place]) 04/25/2016   Depression    Family history of breast cancer    Family history of prostate cancer    Hypertension    "because of stress" (05/27/2016)   PONV (postoperative nausea and vomiting)    Seasonal allergies     SURGICAL HISTORY: Past Surgical History:  Procedure Laterality Date   ABDOMINAL HYSTERECTOMY  Aug 2011   Da Vinci TAH for fibroids   BREAST BIOPSY  03/2016   FOOT NEUROMA SURGERY Right 2000?   MASTECTOMY COMPLETE / SIMPLE W/ SENTINEL NODE BIOPSY Right 05/27/2016   MASTECTOMY W/ SENTINEL NODE BIOPSY Right 05/27/2016   Procedure: RIGHT TOTAL MASTECTOMY WITH AXILLARY SENTINEL LYMPH NODE BIOPSY;  Surgeon: Excell Seltzer, MD;  Location: [facility];   Service: General;  Laterality: Right;   TONSILLECTOMY     WISDOM TOOTH EXTRACTION  ~ 1985    I have reviewed the social history and family history with the patient and they are unchanged from previous note.  ALLERGIES:  has No Known Allergies.  MEDICATIONS:  Current Outpatient Medications  Medication Sig Dispense Refill   amLODipine (NORVASC) 5 MG tablet Take 1 tablet (5 mg total) by mouth daily. 90 tablet 1   amphetamine-dextroamphetamine (ADDERALL) 10 MG tablet 15 mg.  0   cholecalciferol (VITAMIN D) 1000 UNITS tablet Take 6,000 Units by mouth daily.      ibuprofen (ADVIL,MOTRIN) 200 MG tablet Take 200 mg by mouth every 6 (six) hours as needed for mild pain.     L-Tyrosine 500 MG CAPS Take 1 capsule by mouth daily.     Lactobacillus Rhamnosus, GG, (CULTURELLE PO) Take by mouth.     loratadine (CLARITIN) 10 MG tablet Take 10 mg by mouth daily.     magnesium 30 MG tablet Take 500 mg by  mouth daily.      Omega-3 Fatty Acids (FISH OIL PO) Take 1 capsule by mouth daily.      propranolol (INDERAL) 10 MG tablet Take 10 mg by mouth as needed. Take 1 tab PO BID PRN For Anxiety Or 20 Mins Prior to Known Anxiety Provoking Event NTE 2/D     tamoxifen (NOLVADEX) 20 MG tablet Take 1 tablet (20 mg total) by mouth daily. 90 tablet 1   valACYclovir (VALTREX) 1000 MG tablet Take 500-1,000 mg by mouth daily.     venlafaxine XR (EFFEXOR-XR) 75 MG 24 hr capsule Take 75 mg by mouth every morning.     No current facility-administered medications for this visit.    PHYSICAL EXAMINATION: ECOG PERFORMANCE STATUS: 0 - Asymptomatic  Vitals:   10/07/21 0900  BP: (!) 140/106  Pulse: (!) 103  Resp: 18  Temp: 98.6 F (37 C)  SpO2: 97%   Wt Readings from Last 3 Encounters:  10/07/21 204 lb 11.2 oz (92.9 kg)  04/11/21 209 lb 12.8 oz (95.2 kg)  03/20/21 206 lb 6.4 oz (93.6 kg)     GENERAL:alert, no distress and comfortable SKIN: skin color, texture, turgor are normal, no rashes or significant  lesions EYES: normal, Conjunctiva are pink and non-injected, sclera clear  NECK: supple, thyroid normal size, non-tender, without nodularity LYMPH:  no palpable lymphadenopathy in the cervical, axillary LUNGS: clear to auscultation and percussion with normal breathing effort HEART: regular rate & rhythm and no murmurs and no lower extremity edema ABDOMEN:abdomen soft, non-tender and normal bowel sounds Musculoskeletal:no cyanosis of digits and no clubbing  NEURO: alert & oriented x 3 with fluent speech, no focal motor/sensory deficits BREAST: No palpable mass, nodules or adenopathy bilaterally. Breast exam benign.   LABORATORY DATA:  I have reviewed the data as listed CBC Latest Ref Rng & Units 10/07/2021 04/24/2021 09/30/2019  WBC 4.0 - 10.5 K/uL 6.1 5.6 10.2  Hemoglobin 12.0 - 15.0 g/dL 13.9 14.0 13.9  Hematocrit 36.0 - 46.0 % 40.7 40.1 39.0  Platelets 150 - 400 K/uL 350 326 366     CMP Latest Ref Rng & Units 04/24/2021 06/29/2020 09/30/2019  Glucose 70 - 99 mg/dL 107(H) 105(H) 98  BUN 6 - 20 mg/dL _0 Creatinine 0.44 - 1.00 mg/dL 0.86 0.92 0.95  Sodium 135 - 145 mmol/L 138 138 140  Potassium 3.5 - 5.1 mmol/L 3.9 4.2 4.0  Chloride 98 - 111 mmol/L 104 101 104  CO2 22 - 32 mmol/L _1 Calcium 8.9 - 10.3 mg/dL 9.1 9.8 9.0  Total Protein 6.5 - 8.1 g/dL 7.2 7.0 7.6  Total Bilirubin 0.3 - 1.2 mg/dL 0.4 0.3 0.4  Alkaline Phos 38 - 126 U/L 56 52 48  AST 15 - 41 U/L _2 ALT 0 - 44 U/L _3 RADIOGRAPHIC STUDIES: I have personally reviewed the radiological images as listed and agreed with the findings in the report. No results found.    Orders Placed This Encounter  Procedures   MR BREAST BILATERAL W WO CONTRAST INC CAD    Standing Status:   Future    Standing Expiration Date:   10/07/2022    Order Specific Question:   If indicated for the ordered procedure, I authorize the administration of contrast media per Radiology protocol    Answer:   Yes    Order  Specific Question:   What is the patient's sedation requirement?  Answer:   No Sedation    Order Specific Question:   Does the patient have a pacemaker or implanted devices?    Answer:   No    Order Specific Question:   Radiology Contrast Protocol - do NOT remove file path    Answer:   [email or web address]    Order Specific Question:   Preferred imaging location?    Answer:   [facility] (table limit-550lbs)   CMP ([place] only)   All questions were answered. The patient knows to call the clinic with any problems, questions or concerns. No barriers to learning was detected.      [name], MD 10/07/2021   I, Wilburn Mylar, am acting as scribe for [name], MD.   I have reviewed the above documentation for accuracy and completeness, and I agree with the above.

## 2021-12-11 ENCOUNTER — Ambulatory Visit
Admission: RE | Admit: 2021-12-11 | Discharge: 2021-12-11 | Disposition: A | Discharge status: Home / Self Care | Payer: Self-pay | Source: Ambulatory Visit | Attending: Internal Medicine | Admitting: Internal Medicine

## 2021-12-11 DIAGNOSIS — E785 Hyperlipidemia, unspecified: Secondary | ICD-10-CM

## 2021-12-26 ENCOUNTER — Ambulatory Visit
Admission: RE | Admit: 2021-12-26 | Discharge: 2021-12-26 | Disposition: A | Discharge status: Home / Self Care | Payer: Managed Care, Other (non HMO) | Source: Ambulatory Visit | Attending: Hematology | Admitting: Hematology

## 2021-12-26 DIAGNOSIS — C50411 Malignant neoplasm of upper-outer quadrant of right female breast: Secondary | ICD-10-CM

## 2021-12-26 MED ORDER — GADOBUTROL 1 MMOL/ML IV SOLN
9.0000 mL | Freq: Once | INTRAVENOUS | Status: AC | PRN
  Administered 2021-12-26: 9 mL via INTRAVENOUS

## 2022-01-05 ENCOUNTER — Encounter: Payer: Self-pay | Admitting: Hematology

## 2022-04-06 NOTE — Progress Notes (Deleted)
[facility]   [phone] Fax:(336) [phone]   Clinic Follow up Note   Patient Care Team: Michael Boston, MD as PCP - General (Internal Medicine) Brien Few, MD as Consulting Physician (Obstetrics and Gynecology) Artelia Laroche, CNM as Midwife (Obstetrics and Gynecology) [name], MD as Consulting Physician (Hematology) Gery Pray, MD as Consulting Physician (Radiation Oncology) 04/06/2022  CHIEF COMPLAINT: Follow up right breast cancer   SUMMARY OF ONCOLOGIC HISTORY: Oncology History Overview Note  Breast cancer of upper-outer quadrant of right female breast [facility])   Staging form: Breast, AJCC 7th Edition   - Clinical stage from 04/23/2016: Stage IA (T1c(m), N0, M0) - Signed by [name], MD on 04/30/2016   - Pathologic stage from 05/27/2016: Stage IIA (T2(m), N0, cM0) - Unsigned    Breast cancer of upper-outer quadrant of right female breast ([place])  04/17/2016 Mammogram   Mammogram and US showed a 2.2cm cyst at 12:00, a 1.6cm lobulated mass at 9:00 and a 38m mass at 10:00, axilla was negative    04/23/2016 Initial Diagnosis   [place]ast [place] of [place]outer [place]   04/23/2016 Initial Biopsy   Core needle biopsy of right breast masses at 10:00 and 9:30 positions both showed invasive and insitu mammary carcinoma    04/23/2016 Receptors her2   ER 90%+, PR 80-90%+, HER2-, KI67 10-15%   05/12/2016 Procedure   Genetic testing: Negative. Genes analyzed: ATM, BAP1, BARD1, BRCA1, BRCA2, BRIP1, CDH1, CDK4, CDKN2A, CHEK2, EPCAM, FANCC, MITF, MLH1, MSH2, MSH6, NBN, PALB2, PMS2, PTEN, RAD51C, RAD51D, TP53, and XRCC2   05/27/2016 Surgery   Right breast mastectomy and sentinel lymph node biopsy ([place])   05/27/2016 Pathology Results   Right breast mastectomy showed multifocal invasive and in situ lobular carcinoma, 3.2, 0.9 and 0.9 cm, G1 margins were negative, 3 sentinel lymph nodes were negative.    05/27/2016 Oncotype testing    RS 18, which predicts 10 year distant recurrence risk of 12% with tamoxifen (intermediate risk)    11/23/2016 -  Anti-estrogen oral therapy   Tamoxifen 20 mg daily, she started with 163mdaily for the first month    04/27/2018 Mammogram   Benign     CURRENT THERAPY: Tamoxifen daily, starting 11/23/16, held 4/20 - 5/20 due to weight gain and uncontrolled HTN  INTERVAL HISTORY: Ms. Se[place]porteturns for follow up as scheduled. Last seen by Dr. FeBurr Medico/13/23. Screening breast MRI 12/26/21 was negative. She continues tamoxifen.    REVIEW OF SYSTEMS:   Constitutional: Denies fevers, chills or abnormal weight loss Eyes: Denies blurriness of vision Ears, nose, mouth, throat, and face: Denies mucositis or sore throat Respiratory: Denies cough, dyspnea or wheezes Cardiovascular: Denies palpitation, chest discomfort or lower extremity swelling Gastrointestinal:  Denies nausea, heartburn or change in bowel habits Skin: Denies abnormal skin rashes Lymphatics: Denies new lymphadenopathy or easy bruising Neurological:Denies numbness, tingling or new weaknesses Behavioral/Psych: Mood is stable, no new changes  All other systems were reviewed with the patient and are negative.  MEDICAL HISTORY:  Past Medical History:  Diagnosis Date   Anxiety    [place]   Depression    Family history of breast cancer    Family history of prostate cancer    Hypertension    "because of stress" (05/27/2016)   PONV (postoperative nausea and vomiting)    Seasonal allergies     SURGICAL HISTORY: Past Surgical History:  Procedure Laterality Date  ABDOMINAL HYSTERECTOMY  Aug 2011   Da Vinc[employer] TAH for fibroids   BREAST BIOPSY  03/2016   FOOT NEUROMA SURGERY Right 2000?   MASTECTOMY COMPLETE / SIMPLE W/ SENTINEL NODE BIOPSY Right 05/27/2016   MASTECTOMY W/ SENTINEL NODE BIOPSY Right 05/27/2016   Procedure: RIGHT TOTAL MASTECTOMY WITH AXILLARY SENTINEL  LYMPH NODE BIOPSY;  Surgeon: Excell Seltzer, MD;  Location: [facility];  Service: General;  Laterality: Right;   TONSILLECTOMY     WISDOM TOOTH EXTRACTION  ~ 1985    I have reviewed the social history and family history with the patient and they are unchanged from previous note.  ALLERGIES:  has No Known Allergies.  MEDICATIONS:  Current Outpatient Medications  Medication Sig Dispense Refill   amLODipine (NORVASC) 5 MG tablet Take 1 tablet (5 mg total) by mouth daily. 90 tablet 1   amphetamine-dextroamphetamine (ADDERALL) 10 MG tablet 15 mg.  0   cholecalciferol (VITAMIN D) 1000 UNITS tablet Take 6,000 Units by mouth daily.      ibuprofen (ADVIL,MOTRIN) 200 MG tablet Take 200 mg by mouth every 6 (six) hours as needed for mild pain.     L-Tyrosine 500 MG CAPS Take 1 capsule by mouth daily.     Lactobacillus Rhamnosus, GG, (CULTURELLE PO) Take by mouth.     loratadine (CLARITIN) 10 MG tablet Take 10 mg by mouth daily.     magnesium 30 MG tablet Take 500 mg by mouth daily.      Omega-3 Fatty Acids (FISH OIL PO) Take 1 capsule by mouth daily.      propranolol (INDERAL) 10 MG tablet Take 10 mg by mouth as needed. Take 1 tab PO BID PRN For Anxiety Or 20 Mins Prior to Known Anxiety Provoking Event NTE 2/D     tamoxifen (NOLVADEX) 20 MG tablet Take 1 tablet (20 mg total) by mouth daily. 90 tablet 1   valACYclovir (VALTREX) 1000 MG tablet Take 500-1,000 mg by mouth daily.     venlafaxine XR (EFFEXOR-XR) 75 MG 24 hr capsule Take 75 mg by mouth every morning.     No current facility-administered medications for this visit.    PHYSICAL EXAMINATION: ECOG PERFORMANCE STATUS: {CHL ONC ECOG PS:7033391109}  There were no vitals filed for this visit. There were no vitals filed for this visit.  GENERAL:alert, no distress and comfortable SKIN: skin color, texture, turgor are normal, no rashes or significant lesions EYES: normal, Conjunctiva are pink and non-injected, sclera clear OROPHARYNX:no  exudate, no erythema and lips, buccal mucosa, and tongue normal  NECK: supple, thyroid normal size, non-tender, without nodularity LYMPH:  no palpable lymphadenopathy in the cervical, axillary or inguinal LUNGS: clear to auscultation and percussion with normal breathing effort HEART: regular rate & rhythm and no murmurs and no lower extremity edema ABDOMEN:abdomen soft, non-tender and normal bowel sounds Musculoskeletal:no cyanosis of digits and no clubbing  NEURO: alert & oriented x 3 with fluent speech, no focal motor/sensory deficits  LABORATORY DATA:  I have reviewed the data as listed    Latest Ref Rng & Units 10/07/2021    8:38 AM 04/24/2021    8:52 AM 09/30/2019    1:16 PM  CBC  WBC 4.0 - 10.5 K/uL 6.1  5.6  10.2   Hemoglobin 12.0 - 15.0 g/dL 13.9  14.0  13.9   Hematocrit 36.0 - 46.0 % 40.7  40.1  39.0   Platelets 150 - 400 K/uL 350  326  366  Latest Ref Rng & Units 10/07/2021    8:38 AM 04/24/2021    8:52 AM 06/29/2020   11:21 AM  CMP  Glucose 70 - 99 mg/dL 117  107  105   BUN 6 - 20 mg/dL '17  16  18   ' Creatinine 0.44 - 1.00 mg/dL 0.86  0.86  0.92   Sodium 135 - 145 mmol/L 138  138  138   Potassium 3.5 - 5.1 mmol/L 4.1  3.9  4.2   Chloride 98 - 111 mmol/L 105  104  101   CO2 22 - 32 mmol/L '24  22  23   ' Calcium 8.9 - 10.3 mg/dL 9.0  9.1  9.8   Total Protein 6.5 - 8.1 g/dL 6.7  7.2  7.0   Total Bilirubin 0.3 - 1.2 mg/dL 0.6  0.4  0.3   Alkaline Phos 38 - 126 U/L 54  56  52   AST 15 - 41 U/L '19  20  20   ' ALT 0 - 44 U/L '18  28  18       ' RADIOGRAPHIC STUDIES: I have personally reviewed the radiological images as listed and agreed with the findings in the report. No results found.   ASSESSMENT & PLAN:  No problem-specific Assessment & Plan notes found for this encounter.   No orders of the defined types were placed in this encounter.  All questions were answered. The patient knows to call the clinic with any problems, questions or concerns. No barriers to  learning was detected. I spent {CHL ONC TIME VISIT - OADLK:[id number]} counseling the patient face to face. The total time spent in the appointment was {CHL ONC TIME VISIT - VEXOG:[id number]} and more than 50% was on counseling and review of test results     Alla Feeling, NP 04/06/22

## 2022-04-07 ENCOUNTER — Inpatient Hospital Stay: Payer: Commercial Managed Care - HMO | Admitting: Nurse Practitioner

## 2022-04-07 ENCOUNTER — Inpatient Hospital Stay: Payer: Commercial Managed Care - HMO

## 2022-04-07 ENCOUNTER — Other Ambulatory Visit: Payer: Self-pay

## 2022-04-07 DIAGNOSIS — Z170 Estrogen receptor positive status [ER+]: Secondary | ICD-10-CM

## 2022-05-11 ENCOUNTER — Other Ambulatory Visit: Payer: Self-pay | Admitting: Hematology

## 2022-05-11 DIAGNOSIS — C50411 Malignant neoplasm of upper-outer quadrant of right female breast: Secondary | ICD-10-CM

## 2022-06-25 NOTE — Progress Notes (Signed)
[place]   Telephone:(336) [phone] Fax:(336) [phone]   Clinic Follow up Note   Patient Care Team: Michael Boston, MD as PCP - General (Internal Medicine) Brien Few, MD as Consulting Physician (Obstetrics and Gynecology) Artelia Laroche, CNM as Midwife (Obstetrics and Gynecology) [name], MD as Consulting Physician (Hematology) Gery Pray, MD as Consulting Physician (Radiation Oncology) 06/26/2022  CHIEF COMPLAINT: Follow up right breast cancer   SUMMARY OF ONCOLOGIC HISTORY: Oncology History Overview Note  Breast cancer of upper-outer quadrant of right female breast [facility])   Staging form: Breast, AJCC 7th Edition   - Clinical stage from 04/23/2016: Stage IA (T1c(m), N0, M0) - Signed by [name], MD on 04/30/2016   - Pathologic stage from 05/27/2016: Stage IIA (T2(m), N0, cM0) - Unsigned    Breast cancer of upper-outer quadrant of right female breast ([place])  04/17/2016 Mammogram   Mammogram and US showed a 2.2cm cyst at 12:00, a 1.6cm lobulated mass at 9:00 and a 27m mass at 10:00, axilla was negative    04/23/2016 Initial Diagnosis   Breast cancer of upper-outer quadrant of right [place] breast [place]   04/23/2016 Initial Biopsy   Core needle biopsy of right breast masses at 10:00 and 9:30 positions both showed invasive and insitu mammary carcinoma    04/23/2016 Receptors her2   ER 90%+, PR 80-90%+, HER2-, KI67 10-15%   05/12/2016 Procedure   Genetic testing: Negative. Genes analyzed: ATM, BAP1, BARD1, BRCA1, BRCA2, BRIP1, CDH1, CDK4, CDKN2A, CHEK2, EPCAM, FANCC, MITF, MLH1, MSH2, MSH6, NBN, PALB2, PMS2, PTEN, RAD51C, RAD51D, TP53, and XRCC2   05/27/2016 Surgery   Right breast mastectomy and sentinel lymph node biopsy ([place])   05/27/2016 Pathology Results   Right breast mastectomy showed multifocal invasive and in situ lobular carcinoma, 3.2, 0.9 and 0.9 cm, G1 margins were negative, 3 sentinel lymph nodes were negative.    05/27/2016 Oncotype testing    RS 18, which predicts 10 year distant recurrence risk of 12% with tamoxifen (intermediate risk)    11/23/2016 -  Anti-estrogen oral therapy   Tamoxifen 20 mg daily, she started with 162mdaily for the first month    04/27/2018 Mammogram   Benign     CURRENT THERAPY: Tamoxifen daily, starting 11/23/2016; held 12/06/18 - 12/2018 due to weight gain and uncontrolled HTN  INTERVAL HISTORY: Ms. Se[place]eturns for follow up as scheduled, last seen by Dr. FeBurr Medico/13/23. She continues to stagger annual L mammogram and breast MRIs which have been negative. She continues tamoxifen, tolerating well. She gets occasional hot flash but not major.  She is working on her BP with PCP.  She continues working and trying to live a healthy active lifestyle.  Denies concerns in her right chest wall, left breast, new pain, or any other new specific complaints.  All other systems were reviewed with the patient and are negative.  MEDICAL HISTORY:  Past Medical History:  Diagnosis Date   Anxiety    Breast cancer of [place] of right female breast [place]   Depression    Family history of breast cancer    Family history of prostate cancer    Hypertension    "because of stress" (05/27/2016)   PONV (postoperative nausea and vomiting)    Seasonal allergies     SURGICAL HISTORY: Past Surgical History:  Procedure Laterality Date   ABDOMINAL HYSTERECTOMY  Aug 2011   Da Vinci TAH for fibroids   BREAST BIOPSY  03/2016   FOOT NEUROMA SURGERY Right  2000?   MASTECTOMY COMPLETE / SIMPLE W/ SENTINEL NODE BIOPSY Right 05/27/2016   MASTECTOMY W/ SENTINEL NODE BIOPSY Right 05/27/2016   Procedure: RIGHT TOTAL MASTECTOMY WITH AXILLARY SENTINEL LYMPH NODE BIOPSY;  Surgeon: Excell Seltzer, MD;  Location: [facility];  Service: General;  Laterality: Right;   TONSILLECTOMY     WISDOM TOOTH EXTRACTION  ~ 1985    I have reviewed the social history and family history with the patient and they are unchanged from  previous note.  ALLERGIES:  has No Known Allergies.  MEDICATIONS:  Current Outpatient Medications  Medication Sig Dispense Refill   amLODipine (NORVASC) 5 MG tablet Take 1 tablet (5 mg total) by mouth daily. 90 tablet 1   amphetamine-dextroamphetamine (ADDERALL) 10 MG tablet 15 mg.  0   cholecalciferol (VITAMIN D) 1000 UNITS tablet Take 6,000 Units by mouth daily.      ibuprofen (ADVIL,MOTRIN) 200 MG tablet Take 200 mg by mouth every 6 (six) hours as needed for mild pain.     L-Tyrosine 500 MG CAPS Take 1 capsule by mouth daily.     Lactobacillus Rhamnosus, GG, (CULTURELLE PO) Take by mouth.     loratadine (CLARITIN) 10 MG tablet Take 10 mg by mouth daily.     magnesium 30 MG tablet Take 500 mg by mouth daily.      Omega-3 Fatty Acids (FISH OIL PO) Take 1 capsule by mouth daily.      propranolol (INDERAL) 10 MG tablet Take 10 mg by mouth as needed. Take 1 tab PO BID PRN For Anxiety Or 20 Mins Prior to Known Anxiety Provoking Event NTE 2/D     tamoxifen (NOLVADEX) 20 MG tablet Take 1 tablet (20 mg total) by mouth daily. 90 tablet 1   valACYclovir (VALTREX) 1000 MG tablet Take 500-1,000 mg by mouth daily.     venlafaxine XR (EFFEXOR-XR) 75 MG 24 hr capsule Take 75 mg by mouth every morning.     No current facility-administered medications for this visit.    PHYSICAL EXAMINATION: ECOG PERFORMANCE STATUS: 0 - Asymptomatic  Vitals:   06/26/22 0957  BP: (!) 114/103  Pulse: (!) 114  Resp: 17  Temp: 98 F (36.7 C)  SpO2: 95%   Filed Weights   06/26/22 0957  Weight: 202 lb 3 oz (91.7 kg)    GENERAL:alert, no distress and comfortable SKIN: no rash  EYES: sclera clear NECK: without mass  LYMPH:  no palpable cervical or supraclavicular lymphadenopathy  LUNGS: clear with normal breathing effort HEART: regular rate & rhythm, no lower extremity edema ABDOMEN:abdomen soft, non-tender and normal bowel sounds NEURO: alert & oriented x 3 with fluent speech, no focal motor/sensory  deficits Breast exam: s/p right mastectomy, incisions completely healed. No palpable mass in the right chest wall, left breast, or either axilla that I could appreciate. No L nipple discharge or inversion  LABORATORY DATA:  I have reviewed the data as listed    Latest Ref Rng & Units 10/07/2021    8:38 AM 04/24/2021    8:52 AM 09/30/2019    1:16 PM  CBC  WBC 4.0 - 10.5 K/uL 6.1  5.6  10.2   Hemoglobin 12.0 - 15.0 g/dL 13.9  14.0  13.9   Hematocrit 36.0 - 46.0 % 40.7  40.1  39.0   Platelets 150 - 400 K/uL 350  326  366         Latest Ref Rng & Units 10/07/2021    8:38 AM 04/24/2021  8:52 AM 06/29/2020   11:21 AM  CMP  Glucose 70 - 99 mg/dL 117  107  105   BUN 6 - 20 mg/dL _0 Creatinine 0.44 - 1.00 mg/dL 0.86  0.86  0.92   Sodium 135 - 145 mmol/L 138  138  138   Potassium 3.5 - 5.1 mmol/L 4.1  3.9  4.2   Chloride 98 - 111 mmol/L 105  104  101   CO2 22 - 32 mmol/L _1 Calcium 8.9 - 10.3 mg/dL 9.0  9.1  9.8   Total Protein 6.5 - 8.1 g/dL 6.7  7.2  7.0   Total Bilirubin 0.3 - 1.2 mg/dL 0.6  0.4  0.3   Alkaline Phos 38 - 126 U/L 54  56  52   AST 15 - 41 U/L _2 ALT 0 - 44 U/L _3 RADIOGRAPHIC STUDIES: I have personally reviewed the radiological images as listed and agreed with the findings in the report. No results found.   ASSESSMENT & PLAN: JANAE BONSER is a 56 y.o. female with      1. Breast cancer of upper outer quadrant of right breast, invasive lobular carcinoma, mpT2N0M0, stage IIA, ER+/PR+/HER2-, G1, RS 18  -Diagnosed in 03/2016. She was treated with a right mastectomy. She had multifocal disease, surgical margins were negative. Node negative.  -Due to low risk oncotype, adjuvant chemotherapy was not indicated; and due to node-negative disease, she did not require post-mastectomy radiation.  -She started anti-estrogen therapy with Tamoxifen in 11/23/2016, held for month of 11/2018 due to weight gain and worsened HTN. No change  occurred so she restarted.  -Her previous [facility] labs have showed she is still pre-menopausal. Will check again at next visit. If post-menopause will discuss changing to AI -Mr. Goodgame is clinically doing well. Tolerating tamoxifen. Breast exam is benign, labs are stable. Overall no clinical concern for recurrence.  -She is on high risk screening program, with annual left mammo and breast MRI staggered 6 months apart. Due L mammo this month and next MRI in 12/2022, ordered  -She is 6 years from initial diagnosis, the recurrence risk has decreased. We discussed the risk of late recurrence in lobular breast cancers.  -Continue surveillance and tamoxifen -F/up in 6 months, or sooner if needed    2. Bone health -continue calcium and vitamin D -DEXAs per PCP   3. Hypertension, depression, weight gain  -She has stopped Wellbutrin and Zoloft. She is on Effexor and takes propranolol PRN for anxiety  -she is working on her weight and overall wellness  -follow up PCP, Dr. Jacalyn Lefevre   4. Recurrent UTI   -Well managed with Keflex PRN -f/up PCP   PLAN: -Labs and 12/2021 MRI reviewed -Continue breast cancer surveillance and tamoxifen  -lab with [facility] and f/up in 6 months  -L mammo due this month, and screening breast MRI in 12/2022, ordered today   Orders Placed This Encounter  Procedures   MR BREAST BILATERAL W [place] [facility]    Standing Status:   Future    Standing Expiration Date:   06/27/2023    Order Specific Question:   If indicated for the ordered procedure, I authorize the administration of contrast media per Radiology protocol    Answer:   Yes    Order Specific Question:   What is the patient's  sedation requirement?    Answer:   No Sedation    Order Specific Question:   Does the patient have a pacemaker or implanted devices?    Answer:   No    Order Specific Question:   Preferred imaging location?    Answer:   [name] (table limit - 681 lbs)   FSH-Follicle stimulating hormone     Standing Status:   Standing    Number of Occurrences:   1    Standing Expiration Date:   06/27/2023   All questions were answered. The patient knows to call the clinic with any problems, questions or concerns. No barriers to learning was detected. I spent 20 minutes counseling the patient face to face. The total time spent in the appointment was 30 minutes and more than 50% was on counseling and review of test results.     Alla Feeling, NP 06/26/22

## 2022-06-26 ENCOUNTER — Inpatient Hospital Stay: Payer: Commercial Managed Care - HMO | Attending: Nurse Practitioner | Admitting: Nurse Practitioner

## 2022-06-26 ENCOUNTER — Other Ambulatory Visit: Payer: Self-pay

## 2022-06-26 ENCOUNTER — Encounter: Payer: Self-pay | Admitting: Nurse Practitioner

## 2022-06-26 DIAGNOSIS — C50411 Malignant neoplasm of upper-outer quadrant of right female breast: Secondary | ICD-10-CM | POA: Insufficient documentation

## 2022-06-26 DIAGNOSIS — I10 Essential (primary) hypertension: Secondary | ICD-10-CM | POA: Insufficient documentation

## 2022-06-26 DIAGNOSIS — Z9011 Acquired absence of right breast and nipple: Secondary | ICD-10-CM | POA: Insufficient documentation

## 2022-06-26 DIAGNOSIS — Z170 Estrogen receptor positive status [ER+]: Secondary | ICD-10-CM | POA: Diagnosis not present

## 2022-06-26 DIAGNOSIS — Z79899 Other long term (current) drug therapy: Secondary | ICD-10-CM | POA: Diagnosis not present

## 2022-06-26 DIAGNOSIS — F32A Depression, unspecified: Secondary | ICD-10-CM | POA: Insufficient documentation

## 2022-06-26 MED ORDER — TAMOXIFEN CITRATE 20 MG PO TABS: 20.0000 mg | ORAL_TABLET | Freq: Every day | ORAL | 1 refills | Status: DC

## 2022-07-25 ENCOUNTER — Encounter: Payer: Self-pay | Admitting: Hematology

## 2022-12-23 ENCOUNTER — Telehealth: Payer: Self-pay | Admitting: Hematology

## 2022-12-23 NOTE — Telephone Encounter (Signed)
patient called to move her labs and follow up to the same day of her MRI since shes going to be out of town this friday.

## 2022-12-26 ENCOUNTER — Inpatient Hospital Stay: Payer: Commercial Managed Care - HMO | Admitting: Hematology

## 2022-12-26 ENCOUNTER — Inpatient Hospital Stay: Payer: Commercial Managed Care - HMO

## 2022-12-29 ENCOUNTER — Other Ambulatory Visit: Payer: Self-pay

## 2022-12-29 DIAGNOSIS — Z170 Estrogen receptor positive status [ER+]: Secondary | ICD-10-CM

## 2022-12-29 NOTE — Progress Notes (Signed)
[facility]   Telephone:([phone] Fax:([phone]) [phone]   Clinic Follow up Note   Patient Care Team: Melida Quitter, MD as PCP - General (Internal Medicine) Olivia Mackie, MD as Consulting Physician (Obstetrics and Gynecology) Marlinda Mike, CNM as Midwife (Obstetrics and Gynecology) Malachy Mood, MD as Consulting Physician (Hematology) Antony Blackbird, MD as Consulting Physician (Radiation Oncology)  Date of Service:  12/30/2022  CHIEF COMPLAINT: f/u of right breast cancer     CURRENT THERAPY:  Surveillance and Tamoxifen started 11/2022   ASSESSMENT:  Caitlin Monroe is a 57 y.o. female with   Breast cancer of upper-outer quadrant of right female breast (HCC) invasive lobular carcinoma, mpT2N0M0, stage IIA, ER+/PR+/HER2-, G1, RS 18  -diagnosed in 03/2016, s/p right mastectomy. She had multifocal disease, surgical margins and nodes were negative.  -She started anti-estrogen therapy with Tamoxifen in 11/23/16. She is s/p hysterectomy, ovaries still in place. Multiple FSH tests showed she was still premenopausal -left screening mammogram on 07/15/2022 and breast MRI on 04/09/21 were both negative. She will continue annual breast MRI and screening mammogram. -She is clinically doing well. Labs reviewed, CBC is WNL. Physical exam was unremarkable. There is no clinical concern for recurrence. -Continue Tamoxifen, she is tolerating well, plan for a total of 10 years.  -f/u in a year      PLAN: -lab reviewed. -Continue Tamoxifen for another 4 years -Breast MRI schedule for today -lab and f/u in 1 year -Left screening mammogram in November 2024  SUMMARY OF ONCOLOGIC HISTORY: Oncology History Overview Note  Breast cancer of upper-outer quadrant of right female breast [facility])   Staging form: Breast, AJCC 7th Edition   - Clinical stage from 04/23/2016: Stage IA (T1c(m), N0, M0) - Signed by Malachy Mood, MD on 04/30/2016   - Pathologic stage from 05/27/2016: Stage IIA (T2(m), N0,  cM0) - Unsigned    Breast cancer of upper-outer quadrant of right female breast (HCC)  04/17/2016 Mammogram   Mammogram and US showed a 2.2cm cyst at 12:00, a 1.6cm lobulated mass at 9:00 and a 6mm mass at 10:00, axilla was negative    04/23/2016 Initial Diagnosis   Breast cancer of upper-outer quadrant of right female breast (HCC)   04/23/2016 Initial Biopsy   Core needle biopsy of right breast masses at 10:00 and 9:30 positions both showed invasive and insitu mammary carcinoma    04/23/2016 Receptors her2   ER 90%+, PR 80-90%+, HER2-, KI67 10-15%   05/12/2016 Procedure   Genetic testing: Negative. Genes analyzed: ATM, BAP1, BARD1, BRCA1, BRCA2, BRIP1, CDH1, CDK4, CDKN2A, CHEK2, EPCAM, FANCC, MITF, MLH1, MSH2, MSH6, NBN, PALB2, PMS2, PTEN, RAD51C, RAD51D, TP53, and XRCC2   05/27/2016 Surgery   Right breast mastectomy and sentinel lymph node biopsy ([place])   05/27/2016 Pathology Results   Right breast mastectomy showed multifocal invasive and in situ lobular carcinoma, 3.2, 0.9 and 0.9 cm, G1 margins were negative, 3 sentinel lymph nodes were negative.    05/27/2016 Oncotype testing   RS 18, which predicts 10 year distant recurrence risk of 12% with tamoxifen (intermediate risk)    11/23/2016 -  Anti-estrogen oral therapy   Tamoxifen 20 mg daily, she started with 10mg  daily for the first month    04/27/2018 Mammogram   Benign      INTERVAL HISTORY:  Caitlin Monroe is here for a follow up of right breast cancer . She was last seen by NP Lacie on  06/26/2022. She presents to the clinic alone.  Pt state that she has no concerns. Pt state that her BP is good at home, She has been tracking it. Pt state that she has join the GYM 3 days a week. Pt state over-all she is clinically doing well. Pt state that her side effects from Tamoxifen  is very minimum.   All other systems were reviewed with the patient and are negative.  MEDICAL HISTORY:  Past Medical History:  Diagnosis Date    Anxiety    Breast cancer of upper-outer quadrant of right female breast (HCC) 04/25/2016   Depression    Family history of breast cancer    Family history of prostate cancer    Hypertension    "because of stress" (05/27/2016)   PONV (postoperative nausea and vomiting)    Seasonal allergies     SURGICAL HISTORY: Past Surgical History:  Procedure Laterality Date   ABDOMINAL HYSTERECTOMY  Aug 2011   [employer] TAH for fibroids   BREAST BIOPSY  03/2016   FOOT NEUROMA SURGERY Right 2000?   MASTECTOMY COMPLETE / SIMPLE W/ SENTINEL NODE BIOPSY Right 05/27/2016   MASTECTOMY W/ SENTINEL NODE BIOPSY Right 05/27/2016   Procedure: RIGHT TOTAL MASTECTOMY WITH AXILLARY SENTINEL LYMPH NODE BIOPSY;  Surgeon: Glenna Fellows, MD;  Location: [facility] OR;  Service: General;  Laterality: Right;   TONSILLECTOMY     WISDOM TOOTH EXTRACTION  ~ 1985    I have reviewed the social history and family history with the patient and they are unchanged from previous note.  ALLERGIES:  has No Known Allergies.  MEDICATIONS:  Current Outpatient Medications  Medication Sig Dispense Refill   amLODipine (NORVASC) 5 MG tablet Take 1 tablet (5 mg total) by mouth daily. 90 tablet 1   amphetamine-dextroamphetamine (ADDERALL) 10 MG tablet 15 mg.  0   cholecalciferol (VITAMIN D) 1000 UNITS tablet Take 6,000 Units by mouth daily.      ibuprofen (ADVIL,MOTRIN) 200 MG tablet Take 200 mg by mouth every 6 (six) hours as needed for mild pain.     L-Tyrosine 500 MG CAPS Take 1 capsule by mouth daily.     Lactobacillus Rhamnosus, GG, (CULTURELLE PO) Take by mouth.     loratadine (CLARITIN) 10 MG tablet Take 10 mg by mouth daily.     magnesium 30 MG tablet Take 500 mg by mouth daily.      Omega-3 Fatty Acids (FISH OIL PO) Take 1 capsule by mouth daily.      propranolol (INDERAL) 10 MG tablet Take 10 mg by mouth as needed. Take 1 tab PO BID PRN For Anxiety Or 20 Mins Prior to Known Anxiety Provoking Event NTE 2/D     tamoxifen (NOLVADEX)  20 MG tablet Take 1 tablet (20 mg total) by mouth daily. 90 tablet 1   valACYclovir (VALTREX) 1000 MG tablet Take 500-1,000 mg by mouth daily.     venlafaxine XR (EFFEXOR-XR) 75 MG 24 hr capsule Take 75 mg by mouth every morning.     No current facility-administered medications for this visit.    PHYSICAL EXAMINATION: ECOG PERFORMANCE STATUS: 0 - Asymptomatic  Vitals:   12/30/22 0928  BP: (!) 145/103  Pulse: 96  Resp: 15  Temp: 98.3 F (36.8 C)  SpO2: 99%   Wt Readings from Last 3 Encounters:  12/30/22 205 lb 3.2 oz (93.1 kg)  06/26/22 202 lb 3 oz (91.7 kg)  10/07/21 204 lb 11.2 oz (92.9 kg)     GENERAL:alert, no distress and comfortable SKIN: skin color normal, no  rashes or significant lesions EYES: normal, Conjunctiva are pink and non-injected, sclera clear  NEURO: alert & oriented x 3 with fluent speech NECK:(-)  supple, thyroid normal size, non-tender, without nodularity LYMPH:  (-) no palpable lymphadenopathy in the cervical, axillary  ABDOMEN:(-) abdomen soft,(-)  non-tender and(-) normal bowel sounds BREAST: rt breast mastectomy no palpable mass, and Lt breast no palpable mass breast exam benign.     LABORATORY DATA:  I have reviewed the data as listed    Latest Ref Rng & Units 12/30/2022    8:52 AM 10/07/2021    8:38 AM 04/24/2021    8:52 AM  CBC  WBC 4.0 - 10.5 K/uL 6.3  6.1  5.6   Hemoglobin 12.0 - 15.0 g/dL 16.1  09.6  04.5   Hematocrit 36.0 - 46.0 % 39.7  40.7  40.1   Platelets 150 - 400 K/uL 379  350  326         Latest Ref Rng & Units 12/30/2022    8:52 AM 10/07/2021    8:38 AM 04/24/2021    8:52 AM  CMP  Glucose 70 - 99 mg/dL 409  811  914   BUN 6 - 20 mg/dL 21  17  16    Creatinine 0.44 - 1.00 mg/dL 7.82  9.56  2.13   Sodium 135 - 145 mmol/L 136  138  138   Potassium 3.5 - 5.1 mmol/L 4.1  4.1  3.9   Chloride 98 - 111 mmol/L 107  105  104   CO2 22 - 32 mmol/L 22  24  22    Calcium 8.9 - 10.3 mg/dL 8.6  9.0  9.1   Total Protein 6.5 - 8.1 g/dL 7.1   6.7  7.2   Total Bilirubin 0.3 - 1.2 mg/dL 0.6  0.6  0.4   Alkaline Phos 38 - 126 U/L 53  54  56   AST 15 - 41 U/L 24  19  20    ALT 0 - 44 U/L 24  18  28        RADIOGRAPHIC STUDIES: I have personally reviewed the radiological images as listed and agreed with the findings in the report. No results found.    Orders Placed This Encounter  Procedures   MM Digital Screening Unilat L    Standing Status:   Future    Standing Expiration Date:   12/30/2023    Scheduling Instructions:     [facility]    Order Specific Question:   Reason for Exam (SYMPTOM  OR DIAGNOSIS REQUIRED)    Answer:   screening    Order Specific Question:   Is the patient pregnant?    Answer:   No    Order Specific Question:   Preferred imaging location?    Answer:   External   MR BREAST BILATERAL W WO CONTRAST INC CAD    Standing Status:   Future    Standing Expiration Date:   12/30/2023    Order Specific Question:   If indicated for the ordered procedure, I authorize the administration of contrast media per Radiology protocol    Answer:   Yes    Order Specific Question:   What is the patient's sedation requirement?    Answer:   No Sedation    Order Specific Question:   Does the patient have a pacemaker or implanted devices?    Answer:   No    Order Specific Question:   Radiology Contrast Protocol - do NOT remove file  path    Answer:   [email or web address]    Order Specific Question:   Preferred imaging location?    Answer:   [facility] (table limit - 550 lbs)   All questions were answered. The patient knows to call the clinic with any problems, questions or concerns. No barriers to learning was detected. The total time spent in the appointment was 25 minutes.     Malachy Mood, MD 12/30/2022   Carolin Coy, CMA, am acting as scribe for Malachy Mood, MD.   I have reviewed the above documentation for accuracy and completeness, and I agree with the above.

## 2022-12-30 ENCOUNTER — Other Ambulatory Visit: Payer: Self-pay

## 2022-12-30 ENCOUNTER — Inpatient Hospital Stay: Payer: Commercial Managed Care - HMO | Attending: Hematology

## 2022-12-30 ENCOUNTER — Inpatient Hospital Stay: Payer: Commercial Managed Care - HMO | Admitting: Hematology

## 2022-12-30 ENCOUNTER — Encounter: Payer: Self-pay | Admitting: Hematology

## 2022-12-30 ENCOUNTER — Ambulatory Visit (HOSPITAL_COMMUNITY)
Admission: RE | Admit: 2022-12-30 | Discharge: 2022-12-30 | Disposition: A | Discharge status: Home / Self Care | Payer: Commercial Managed Care - HMO | Source: Ambulatory Visit | Attending: Nurse Practitioner | Admitting: Nurse Practitioner

## 2022-12-30 VITALS — BP 145/103 | HR 96 | Temp 98.3°F | Resp 15 | Wt 205.2 lb

## 2022-12-30 DIAGNOSIS — Z1231 Encounter for screening mammogram for malignant neoplasm of breast: Secondary | ICD-10-CM

## 2022-12-30 DIAGNOSIS — Z9011 Acquired absence of right breast and nipple: Secondary | ICD-10-CM | POA: Insufficient documentation

## 2022-12-30 DIAGNOSIS — Z170 Estrogen receptor positive status [ER+]: Secondary | ICD-10-CM | POA: Insufficient documentation

## 2022-12-30 DIAGNOSIS — Z79899 Other long term (current) drug therapy: Secondary | ICD-10-CM | POA: Diagnosis not present

## 2022-12-30 DIAGNOSIS — C50411 Malignant neoplasm of upper-outer quadrant of right female breast: Secondary | ICD-10-CM | POA: Insufficient documentation

## 2022-12-30 LAB — CMP (CANCER CENTER ONLY)
ALT: 24 U/L (ref 0–44)
AST: 24 U/L (ref 15–41)
Albumin: 4.1 g/dL (ref 3.5–5.0)
Alkaline Phosphatase: 53 U/L (ref 38–126)
Anion gap: 7 (ref 5–15)
BUN: 21 mg/dL — ABNORMAL HIGH (ref 6–20)
CO2: 22 mmol/L (ref 22–32)
Calcium: 8.6 mg/dL — ABNORMAL LOW (ref 8.9–10.3)
Chloride: 107 mmol/L (ref 98–111)
Creatinine: 0.85 mg/dL (ref 0.44–1.00)
GFR, Estimated: >60 mL/min (ref >60)
Glucose, Bld: 110 mg/dL — ABNORMAL HIGH (ref 70–99)
Potassium: 4.1 mmol/L (ref 3.5–5.1)
Sodium: 136 mmol/L (ref 135–145)
Total Bilirubin: 0.6 mg/dL (ref 0.3–1.2)
Total Protein: 7.1 g/dL (ref 6.5–8.1)

## 2022-12-30 LAB — CBC WITH DIFFERENTIAL (CANCER CENTER ONLY)
Abs Immature Granulocytes: 0.01 10*3/uL (ref 0.00–0.07)
Basophils Absolute: 0 10*3/uL (ref 0.0–0.1)
Basophils Relative: 0 %
Eosinophils Absolute: 0.1 10*3/uL (ref 0.0–0.5)
Eosinophils Relative: 2 %
HCT: 39.7 % (ref 36.0–46.0)
Hemoglobin: 13.7 g/dL (ref 12.0–15.0)
Immature Granulocytes: 0 %
Lymphocytes Relative: 38 %
Lymphs Abs: 2.4 10*3/uL (ref 0.7–4.0)
MCH: 31.3 pg (ref 26.0–34.0)
MCHC: 34.5 g/dL (ref 30.0–36.0)
MCV: 90.6 fL (ref 80.0–100.0)
Monocytes Absolute: 0.4 10*3/uL (ref 0.1–1.0)
Monocytes Relative: 7 %
Neutro Abs: 3.3 10*3/uL (ref 1.7–7.7)
Neutrophils Relative %: 53 %
Platelet Count: 379 10*3/uL (ref 150–400)
RBC: 4.38 MIL/uL (ref 3.87–5.11)
RDW: 12.4 % (ref 11.5–15.5)
WBC Count: 6.3 10*3/uL (ref 4.0–10.5)
nRBC: 0 % (ref 0.0–0.2)

## 2022-12-30 MED ORDER — TAMOXIFEN CITRATE 20 MG PO TABS: 20.0000 mg | ORAL_TABLET | Freq: Every day | ORAL | 1 refills | Status: DC

## 2022-12-30 MED ORDER — GADOBUTROL 1 MMOL/ML IV SOLN
9.0000 mL | Freq: Once | INTRAVENOUS | Status: AC | PRN
  Administered 2022-12-30: 9 mL via INTRAVENOUS

## 2022-12-30 NOTE — Assessment & Plan Note (Signed)
invasive lobular carcinoma, mpT2N0M0, stage IIA, ER+/PR+/HER2-, G1, RS 18  -diagnosed in 03/2016, s/p right mastectomy. She had multifocal disease, surgical margins and nodes were negative.  -She started anti-estrogen therapy with Tamoxifen in 11/23/16. She is s/p hysterectomy, ovaries still in place. For the first month she used 10mg  daily. She continues to tolerate well with minimal hot flashes. Her BP still not well controlled and she feels UTIs are related but she wishes to continue Tamoxifen.  -Her 03/2021 [facility] showed she was still premenopausal. Plan to switch her to AI once postmenopausal.  -left screening mammogram on 07/15/21 and breast MRI on 04/09/21 were both negative. -She is clinically doing well. Labs reviewed, CBC is WNL. Physical exam was unremarkable. There is no clinical concern for recurrence. -Continue Tamoxifen  -f/u in a year

## 2023-01-01 LAB — FOLLICLE STIMULATING HORMONE: FSH: 22.5 m[IU]/mL

## 2023-06-27 ENCOUNTER — Other Ambulatory Visit: Payer: Self-pay | Admitting: Nurse Practitioner

## 2023-06-27 DIAGNOSIS — Z170 Estrogen receptor positive status [ER+]: Secondary | ICD-10-CM

## 2023-08-12 ENCOUNTER — Encounter: Payer: Self-pay | Admitting: Hematology

## 2023-12-14 ENCOUNTER — Other Ambulatory Visit: Payer: Self-pay

## 2023-12-14 DIAGNOSIS — Z1231 Encounter for screening mammogram for malignant neoplasm of breast: Secondary | ICD-10-CM

## 2023-12-14 DIAGNOSIS — Z170 Estrogen receptor positive status [ER+]: Secondary | ICD-10-CM

## 2023-12-14 NOTE — Progress Notes (Signed)
 Notified by [place] [employer] on Revenue Cycle Team that MRI bilateral breast was only authorized at [facility] by [employer].  Original order was for [facility] which pt was also scheduled.  Notified [facility] in [place] Scheduling to cancel the appt.  New order placed for MRI bilateral breast for [facility].  LVM for pt stating the appt scheduled for MRI at [facility] was cancelled d/t pt's insurance will not authorize this location.  Stated the authorized location is [facility] and provided pt with the telephone number to contact [facility] to schedule her MRI appt.  Instructed pt to contact Dr. Candise Chambers office should she have additional questions or concerns.  Faxed [facility] the order for the MRI Bilateral Breast.  Requested if GI would contact pt to get her scheduled by 12/30/2023.  Fax confirmation received.

## 2023-12-23 ENCOUNTER — Ambulatory Visit
Admission: RE | Admit: 2023-12-23 | Discharge: 2023-12-23 | Disposition: A | Discharge status: Home / Self Care | Source: Ambulatory Visit | Attending: Hematology | Admitting: Hematology

## 2023-12-23 DIAGNOSIS — Z1231 Encounter for screening mammogram for malignant neoplasm of breast: Secondary | ICD-10-CM

## 2023-12-23 DIAGNOSIS — Z170 Estrogen receptor positive status [ER+]: Secondary | ICD-10-CM

## 2023-12-23 MED ORDER — GADOPICLENOL 0.5 MMOL/ML IV SOLN
9.0000 mL | Freq: Once | INTRAVENOUS | Status: AC | PRN
  Administered 2023-12-23: 9 mL via INTRAVENOUS

## 2023-12-24 ENCOUNTER — Ambulatory Visit (HOSPITAL_COMMUNITY)

## 2023-12-29 ENCOUNTER — Other Ambulatory Visit: Payer: Self-pay | Admitting: Nurse Practitioner

## 2023-12-29 DIAGNOSIS — C50411 Malignant neoplasm of upper-outer quadrant of right female breast: Secondary | ICD-10-CM

## 2023-12-29 NOTE — Assessment & Plan Note (Signed)
 invasive lobular carcinoma, mpT2N0M0, stage IIA, ER+/PR+/HER2-, G1, RS 18  -diagnosed in 03/2016, s/p right mastectomy. She had multifocal disease, surgical margins and nodes were negative.  -She started anti-estrogen therapy with Tamoxifen  in 11/23/16. She is s/p hysterectomy, ovaries still in place. For the first month she used 10mg  daily. She continues to tolerate well with minimal hot flashes. Her BP still not well controlled and she feels UTIs are related but she wishes to continue Tamoxifen .  -Her 03/2021 [facility] showed she was still premenopausal. Plan to switch her to AI once postmenopausal.  -left screening mammogram on 07/15/21 and breast MRI on 04/09/21 were both negative. Bilateral breast MRI done 12/2022 was negative -Left screening mammogram done 06/2023 was negative. 12/24/2023 Bilateral breast MRI showing normal right chest wall. There is category C fibroglandular tissue. She has a 1.3 linear, non-mass enhancing lesion in posterior left breast. Radiology has recommended a MRI guided biopsy of the new lesion. There are no palpable masses or lumps on today's exam. A STAT order was placed for MRI guided biopsy of new abnormality of left breast.  -continue tamoxifen  daily.  -Will follow up with her via phone visit once biopsy results are available.

## 2023-12-29 NOTE — Progress Notes (Unsigned)
 Patient Care Team: Azalia Leo, MD as PCP - General (Internal Medicine) Meriam Stamp, MD as Consulting Physician (Obstetrics and Gynecology) Teresa Fender, CNM as Midwife (Obstetrics and Gynecology) Sonja [place], MD as Consulting Physician (Hematology) Retta Caster, MD as Consulting Physician (Radiation Oncology) Diagnostic Radiology & Imaging, [facility] as Radiologist (Diagnostic Radiology)  Clinic Day:  12/30/2023  Referring physician: Azalia Leo, MD  ASSESSMENT & PLAN:   Assessment & Plan: Breast cancer of upper-outer quadrant of right female breast (HCC) invasive lobular carcinoma, mpT2N0M0, stage IIA, ER+/PR+/HER2-, G1, RS 18  -diagnosed in 03/2016, s/p right mastectomy. She had multifocal disease, surgical margins and nodes were negative.  -She started anti-estrogen therapy with Tamoxifen  in 11/23/16. She is s/p hysterectomy, ovaries still in place. For the first month she used 10mg  daily. She continues to tolerate well with minimal hot flashes. Her BP still not well controlled and she feels UTIs are related but she wishes to continue Tamoxifen .  -Her 03/2021 [facility] showed she was still premenopausal. Plan to switch her to AI once postmenopausal.  -left screening mammogram on 07/15/21 and breast MRI on 04/09/21 were both negative. Bilateral breast MRI done 12/2022 was negative -Left screening mammogram done 06/2023 was negative. 12/24/2023 Bilateral breast MRI showing normal right chest wall. There is category C fibroglandular tissue. She has a 1.3 linear, non-mass enhancing lesion in posterior left breast. Radiology has recommended a MRI guided biopsy of the new lesion. There are no palpable masses or lumps on today's exam. A STAT order was placed for MRI guided biopsy of new abnormality of left breast.  -continue tamoxifen  daily.  -Will follow up with her via phone visit once biopsy results are available.    Abnormal breast MRI Bilateral breast MRI done 12/24/2023 showing normal right chest  wall. There is category C fibroglandular tissue. She has a 1.3 linear, non-mass enhancing lesion in posterior left breast. Radiology has recommended a MRI guided biopsy of the new lesion. There are no palpable masses or lumps on today's exam. A STAT order was placed for MRI guided biopsy of new abnormality of left breast. Will follow up with her via phone visit once biopsy results are available.   Plan: Reviewed labs  -all stable and unremarkable.  Discussed results of new breast MRI with patient, showing 1.3 cm non-mass, linear lesion in posterior left breast.  -STAT MRI guided biopsy ordered of new enhancing lesion. Will reach out to [facility] to ensure prompt appointment.  -telephone follow up with patient after biopsy results available.  Continue tamoxifen  20 mg daily.   The patient understands the plans discussed today and is in agreement with them.  She knows to contact our office if she develops concerns prior to her next appointment.  I provided 30 minutes of face-to-face time during this encounter and > 50% was spent counseling as documented under my assessment and plan.    Sharyon Deis, NP  [facility] - A DEPT OF [facility] [address] [place] [address] Dept: [phone] Dept Fax: [phone]   Orders Placed This Encounter  Procedures   MR LT BREAST BX W LOC DEV 1ST LESION IMAGE BX SPEC MR GUIDE    Standing Status:   Future    Expected Date:   12/31/2023    Expiration Date:   12/29/2024    Reason for Exam (SYMPTOM  OR DIAGNOSIS REQUIRED):   1.3 cm linear enhancing lesion in  left breast on recent MRI. history of Right breat cancer with mastectomy    Preferred Imaging Location?:   [facility] (table limit-550lbs)      CHIEF COMPLAINT:  CC: Right breast cancer, estrogen receptor positive  Current Treatment: Tamoxifen  started 11/23/2016 (goal of 10 years treatment)  INTERVAL HISTORY:   Austine is here today for repeat clinical assessment.  She was last seen on 12/30/2022 by Dr. Maryalice Smaller. Had bilateral breast MRI showing normal right chest wall. There is category C fibroglandular tissue. She has a 1.3 linear, non-mass enhancing lesion in posterior left breast. Radiology has recommended a MRI guided biopsy of the new lesion.  She has not noted any palpable masses, lumps, or changes in the right breast. Will order recommended biopsy today. She denies chest pain, chest pressure, or shortness of breath. She denies headaches or visual disturbances. She denies abdominal pain, nausea, vomiting, or changes in bowel or bladder habits.  She denies fevers or chills. She denies pain. Her appetite is good. Her weight has decreased 7 pounds over last year . She states that she has recently lost 17 pounds. Since her last visit, she gained 10 pounds. Has started compounded semiglutide to help her with weight management. She is doing well with this along with diet changes and regular exercise.   I have reviewed the past medical history, past surgical history, social history and family history with the patient and they are unchanged from previous note.  ALLERGIES:  has no known allergies.  MEDICATIONS:  Current Outpatient Medications  Medication Sig Dispense Refill   cephALEXin  (KEFLEX ) 500 MG capsule Take 1 capsule (500 mg total) by mouth 3 (three) times daily.     cholecalciferol (VITAMIN D ) 1000 UNITS tablet Take 6,000 Units by mouth daily.      hydrochlorothiazide  (HYDRODIURIL ) 25 MG tablet Take 1 tablet (25 mg total) by mouth daily.     ibuprofen  (ADVIL ,MOTRIN ) 200 MG tablet Take 200 mg by mouth every 6 (six) hours as needed for mild pain.     L-Tyrosine 500 MG CAPS Take 1 capsule by mouth daily.     loratadine (CLARITIN) 10 MG tablet Take 10 mg by mouth daily.     magnesium 30 MG tablet Take 500 mg by mouth daily.      Omega-3 Fatty Acids (FISH OIL PO) Take 1 capsule by mouth daily.       Semaglutide-Weight Management 0.5 MG/0.5ML SOAJ Inject 0.5 mg into the skin once a week.     tamoxifen  (NOLVADEX ) 20 MG tablet TAKE 1 TABLET(20 MG) BY MOUTH DAILY 90 tablet 1   valACYclovir  (VALTREX ) 1000 MG tablet Take 500-1,000 mg by mouth daily.     venlafaxine  XR (EFFEXOR -XR) 75 MG 24 hr capsule Take 112.5 mg by mouth every morning. Updated by patient     amLODipine  (NORVASC ) 10 MG tablet Take 1 tablet (10 mg total) by mouth daily.     No current facility-administered medications for this visit.    HISTORY OF PRESENT ILLNESS:   Oncology History Overview Note  Breast cancer of upper-outer quadrant of right female breast [facility])   Staging form: Breast, AJCC 7th Edition   - Clinical stage from 04/23/2016: Stage IA (T1c(m), N0, M0) - Signed by Sonja Fentress, MD on 04/30/2016   - Pathologic stage from 05/27/2016: Stage IIA (T2(m), N0, cM0) - Unsigned    Breast cancer of upper-outer quadrant of right female breast (HCC)  04/17/2016 Mammogram   Mammogram and US  showed a 2.2cm cyst  at 12:00, a 1.6cm lobulated mass at 9:00 and a 6mm mass at 10:00, axilla was negative    04/23/2016 Initial Diagnosis   Breast cancer of upper-outer quadrant of right female breast (HCC)   04/23/2016 Initial Biopsy   Core needle biopsy of right breast masses at 10:00 and 9:30 positions both showed invasive and insitu mammary carcinoma    04/23/2016 Receptors her2   ER 90%+, PR 80-90%+, HER2-, KI67 10-15%   05/12/2016 Procedure   Genetic testing: Negative. Genes analyzed: ATM, BAP1, BARD1, BRCA1, BRCA2, BRIP1, CDH1, CDK4, CDKN2A, CHEK2, EPCAM, FANCC, MITF, MLH1, MSH2, MSH6, NBN, PALB2, PMS2, PTEN, RAD51C, RAD51D, TP53, and XRCC2   05/27/2016 Surgery   Right breast mastectomy and sentinel lymph node biopsy ([facility])   05/27/2016 Pathology Results   Right breast mastectomy showed multifocal invasive and in situ lobular carcinoma, 3.2, 0.9 and 0.9 cm, G1 margins were negative, 3 sentinel lymph nodes were negative.     05/27/2016 Oncotype testing   RS 18, which predicts 10 year distant recurrence risk of 12% with tamoxifen  (intermediate risk)    11/23/2016 -  Anti-estrogen oral therapy   Tamoxifen  20 mg daily, she started with 10mg  daily for the first month    04/27/2018 Mammogram   Benign       REVIEW OF SYSTEMS:   Constitutional: Denies fevers, chills or abnormal weight loss Eyes: Denies blurriness of vision Ears, nose, mouth, throat, and face: Denies mucositis or sore throat Respiratory: Denies cough, dyspnea or wheezes Cardiovascular: Denies palpitation, chest discomfort or lower extremity swelling Gastrointestinal:  Denies nausea, heartburn or change in bowel habits Skin: Denies abnormal skin rashes Lymphatics: Denies new lymphadenopathy or easy bruising Neurological:Denies numbness, tingling or new weaknesses Behavioral/Psych: Mood is stable, no new changes  All other systems were reviewed with the patient and are negative.   VITALS:   Today's Vitals   12/30/23 0904 12/30/23 0955  BP: 138/82   Pulse: (!) 110   Resp: 17   Temp: 97.8 F (36.6 C)   SpO2: 99%   Weight: 198 lb 11.2 oz (90.1 kg)   PainSc:  0-No pain   Body mass index is 30.21 kg/m.   Wt Readings from Last 3 Encounters:  12/30/23 198 lb 11.2 oz (90.1 kg)  12/30/22 205 lb 3.2 oz (93.1 kg)  06/26/22 202 lb 3 oz (91.7 kg)    Body mass index is 30.21 kg/m.  Performance status (ECOG): 0 - Asymptomatic  PHYSICAL EXAM:   GENERAL:alert, no distress and comfortable SKIN: skin color, texture, turgor are normal, no rashes or significant lesions EYES: normal, Conjunctiva are pink and non-injected, sclera clear OROPHARYNX:no exudate, no erythema and lips, buccal mucosa, and tongue normal  NECK: supple, thyroid normal size, non-tender, without nodularity LYMPH:  no palpable lymphadenopathy in the cervical, axillary or inguinal LUNGS: clear to auscultation and percussion with normal breathing effort HEART: regular rate &  rhythm and no murmurs and no lower extremity edema ABDOMEN:abdomen soft, non-tender and normal bowel sounds Musculoskeletal:no cyanosis of digits and no clubbing  NEURO: alert & oriented x 3 with fluent speech, no focal motor/sensory deficits BREAST: Right mastectomy scar is nicely healed. No abnormalities palpated in the right chest wall. There is no axillary lymphadenopathy on the right. There are no palpable masses or lumps in the left breast during today's exam. There is no nipple inversion or nipple discharge. There is no axillary lymphadenopathy on the left.   LABORATORY DATA:  I have reviewed the data as listed  Component Value Date/Time   NA 138 12/30/2023 0845   NA 138 06/29/2020 1121   NA 138 04/16/2017 0852   K 4.1 12/30/2023 0845   K 4.5 04/16/2017 0852   CL 103 12/30/2023 0845   CO2 25 12/30/2023 0845   CO2 24 04/16/2017 0852   GLUCOSE 109 (H) 12/30/2023 0845   GLUCOSE 95 04/16/2017 0852   BUN 20 12/30/2023 0845   BUN 18 06/29/2020 1121   BUN 15.9 04/16/2017 0852   CREATININE 0.88 12/30/2023 0845   CREATININE 0.8 04/16/2017 0852   CALCIUM 8.9 12/30/2023 0845   CALCIUM 8.9 04/16/2017 0852   PROT 7.0 12/30/2023 0845   PROT 7.0 06/29/2020 1121   PROT 7.1 04/16/2017 0852   ALBUMIN 4.5 12/30/2023 0845   ALBUMIN 4.5 06/29/2020 1121   ALBUMIN 3.9 04/16/2017 0852   AST 20 12/30/2023 0845   AST 17 04/16/2017 0852   ALT 16 12/30/2023 0845   ALT 14 04/16/2017 0852   ALKPHOS 38 12/30/2023 0845   ALKPHOS 38 (L) 04/16/2017 0852   BILITOT 0.6 12/30/2023 0845   BILITOT 0.60 04/16/2017 0852   GFRNONAA >60 12/30/2023 0845   GFRNONAA >89 07/19/2014 0901   GFRAA 82 06/29/2020 1121   GFRAA >89 07/19/2014 0901    Lab Results  Component Value Date   WBC 6.9 12/30/2023   NEUTROABS 3.9 12/30/2023   HGB 14.3 12/30/2023   HCT 40.3 12/30/2023   MCV 89.6 12/30/2023   PLT 368 12/30/2023    RADIOGRAPHIC STUDIES: MR BREAST BILATERAL W WO CONTRAST INC CAD Result Date:  12/29/2023 CLINICAL DATA:  High risk screening. History of right breast cancer status post mastectomy in 2017. EXAM: BILATERAL BREAST MRI WITH AND WITHOUT CONTRAST TECHNIQUE: Multiplanar, multisequence MR images of both breasts were obtained prior to and following the intravenous administration of 9 ml of Vueway  Three-dimensional MR images were rendered by post-processing of the original MR data on an independent workstation. The three-dimensional MR images were interpreted, and findings are reported in the following complete MRI report for this study. Three dimensional images were evaluated at the independent interpreting workstation using the DynaCAD thin client. COMPARISON:  Previous exam(s). FINDINGS: Breast composition: c. Heterogeneous fibroglandular tissue. Background parenchymal enhancement: Mild No abnormal enhancement along the right chest wall. Left breast: There is some subtle indeterminate linear non mass enhancement in the outer posterior left breast spanning 1.3 cm (series 12, image 68). No additional suspicious enhancing mass or non mass enhancement elsewhere in the left breast. Lymph nodes: No abnormal appearing lymph nodes. Ancillary findings:  None. IMPRESSION: Indeterminate 1.3 cm area of linear non mass enhancement in the outer posterior left breast. RECOMMENDATION: MRI guided core needle biopsy x 1 left breast. BI-RADS CATEGORY  4: Suspicious. Electronically Signed   By: Allena Ito M.D.   On: 12/29/2023 15:01

## 2023-12-30 ENCOUNTER — Encounter: Payer: Self-pay | Admitting: Nurse Practitioner

## 2023-12-30 ENCOUNTER — Inpatient Hospital Stay: Payer: Commercial Managed Care - HMO | Attending: Hematology

## 2023-12-30 ENCOUNTER — Inpatient Hospital Stay: Payer: Commercial Managed Care - HMO | Admitting: Nurse Practitioner

## 2023-12-30 ENCOUNTER — Other Ambulatory Visit: Payer: Self-pay | Admitting: Nurse Practitioner

## 2023-12-30 VITALS — BP 138/82 | HR 110 | Temp 97.8°F | Resp 17 | Wt 198.7 lb

## 2023-12-30 DIAGNOSIS — Z1732 Human epidermal growth factor receptor 2 negative status: Secondary | ICD-10-CM | POA: Diagnosis not present

## 2023-12-30 DIAGNOSIS — Z9011 Acquired absence of right breast and nipple: Secondary | ICD-10-CM | POA: Diagnosis not present

## 2023-12-30 DIAGNOSIS — N951 Menopausal and female climacteric states: Secondary | ICD-10-CM | POA: Diagnosis not present

## 2023-12-30 DIAGNOSIS — R928 Other abnormal and inconclusive findings on diagnostic imaging of breast: Secondary | ICD-10-CM | POA: Diagnosis not present

## 2023-12-30 DIAGNOSIS — Z1721 Progesterone receptor positive status: Secondary | ICD-10-CM | POA: Diagnosis not present

## 2023-12-30 DIAGNOSIS — I10 Essential (primary) hypertension: Secondary | ICD-10-CM | POA: Diagnosis not present

## 2023-12-30 DIAGNOSIS — Z170 Estrogen receptor positive status [ER+]: Secondary | ICD-10-CM | POA: Insufficient documentation

## 2023-12-30 DIAGNOSIS — C50411 Malignant neoplasm of upper-outer quadrant of right female breast: Secondary | ICD-10-CM

## 2023-12-30 DIAGNOSIS — N649 Disorder of breast, unspecified: Secondary | ICD-10-CM | POA: Insufficient documentation

## 2023-12-30 DIAGNOSIS — Z79899 Other long term (current) drug therapy: Secondary | ICD-10-CM | POA: Diagnosis not present

## 2023-12-30 LAB — CBC WITH DIFFERENTIAL (CANCER CENTER ONLY)
Abs Immature Granulocytes: 0.01 10*3/uL (ref 0.00–0.07)
Basophils Absolute: 0 10*3/uL (ref 0.0–0.1)
Basophils Relative: 1 %
Eosinophils Absolute: 0.2 10*3/uL (ref 0.0–0.5)
Eosinophils Relative: 3 %
HCT: 40.3 % (ref 36.0–46.0)
Hemoglobin: 14.3 g/dL (ref 12.0–15.0)
Immature Granulocytes: 0 %
Lymphocytes Relative: 34 %
Lymphs Abs: 2.3 10*3/uL (ref 0.7–4.0)
MCH: 31.8 pg (ref 26.0–34.0)
MCHC: 35.5 g/dL (ref 30.0–36.0)
MCV: 89.6 fL (ref 80.0–100.0)
Monocytes Absolute: 0.4 10*3/uL (ref 0.1–1.0)
Monocytes Relative: 6 %
Neutro Abs: 3.9 10*3/uL (ref 1.7–7.7)
Neutrophils Relative %: 56 %
Platelet Count: 368 10*3/uL (ref 150–400)
RBC: 4.5 MIL/uL (ref 3.87–5.11)
RDW: 12.1 % (ref 11.5–15.5)
WBC Count: 6.9 10*3/uL (ref 4.0–10.5)
nRBC: 0 % (ref 0.0–0.2)

## 2023-12-30 LAB — CMP (CANCER CENTER ONLY)
ALT: 16 U/L (ref 0–44)
AST: 20 U/L (ref 15–41)
Albumin: 4.5 g/dL (ref 3.5–5.0)
Alkaline Phosphatase: 38 U/L (ref 38–126)
Anion gap: 10 (ref 5–15)
BUN: 20 mg/dL (ref 6–20)
CO2: 25 mmol/L (ref 22–32)
Calcium: 8.9 mg/dL (ref 8.9–10.3)
Chloride: 103 mmol/L (ref 98–111)
Creatinine: 0.88 mg/dL (ref 0.44–1.00)
GFR, Estimated: >60 mL/min (ref >60)
Glucose, Bld: 109 mg/dL — ABNORMAL HIGH (ref 70–99)
Potassium: 4.1 mmol/L (ref 3.5–5.1)
Sodium: 138 mmol/L (ref 135–145)
Total Bilirubin: 0.6 mg/dL (ref 0.0–1.2)
Total Protein: 7 g/dL (ref 6.5–8.1)

## 2023-12-30 MED ORDER — SEMAGLUTIDE-WEIGHT MANAGEMENT 0.5 MG/0.5ML ~~LOC~~ SOAJ: 0.5000 mg | SUBCUTANEOUS | Status: AC

## 2023-12-30 MED ORDER — CEPHALEXIN 500 MG PO CAPS: 500.0000 mg | ORAL_CAPSULE | Freq: Three times a day (TID) | ORAL | Status: AC

## 2023-12-30 MED ORDER — AMLODIPINE BESYLATE 10 MG PO TABS: 10.0000 mg | ORAL_TABLET | Freq: Every day | ORAL | Status: AC

## 2023-12-30 MED ORDER — HYDROCHLOROTHIAZIDE 25 MG PO TABS: 25.0000 mg | ORAL_TABLET | Freq: Every day | ORAL | Status: AC

## 2024-01-04 ENCOUNTER — Other Ambulatory Visit (HOSPITAL_COMMUNITY): Payer: Self-pay | Admitting: Diagnostic Radiology

## 2024-01-04 ENCOUNTER — Ambulatory Visit
Admission: RE | Admit: 2024-01-04 | Discharge: 2024-01-04 | Disposition: A | Discharge status: Home / Self Care | Source: Ambulatory Visit | Attending: Nurse Practitioner | Admitting: Nurse Practitioner

## 2024-01-04 DIAGNOSIS — Z170 Estrogen receptor positive status [ER+]: Secondary | ICD-10-CM

## 2024-01-04 DIAGNOSIS — R928 Other abnormal and inconclusive findings on diagnostic imaging of breast: Secondary | ICD-10-CM

## 2024-01-05 LAB — SURGICAL PATHOLOGY

## 2024-01-10 NOTE — Assessment & Plan Note (Signed)
 invasive lobular carcinoma, mpT2N0M0, stage IIA, ER+/PR+/HER2-, G1, RS 18  -diagnosed in 03/2016, s/p right mastectomy. She had multifocal disease, surgical margins and nodes were negative.  -She started anti-estrogen therapy with Tamoxifen  in 11/23/16. She is s/p hysterectomy, ovaries still in place. For the first month she used 10mg  daily. She continues to tolerate well with minimal hot flashes. Her BP still not well controlled and she feels UTIs are related but she wishes to continue Tamoxifen .  -Her 03/2021 [facility] showed she was still premenopausal. Plan to switch her to AI once postmenopausal.  -left screening mammogram on 07/15/21 and breast MRI on 04/09/21 were both negative. Bilateral breast MRI done 12/2022 was negative -Left screening mammogram done 06/2023 was negative. 12/24/2023 Bilateral breast MRI showing normal right chest wall. There is category C fibroglandular tissue. She has a 1.3 linear, non-mass enhancing lesion in posterior left breast. Radiology has recommended a MRI guided biopsy of the new lesion. There are no palpable masses or lumps on today's exam. A STAT order was placed for MRI guided biopsy of new abnormality of left breast.  -continue tamoxifen  daily.  MRI guided biopsy done 01/04/2024 - it was negative for atypia and malignancy

## 2024-01-10 NOTE — Progress Notes (Signed)
 Patient Care Team: Caitlin Leo, MD as PCP - General (Internal Medicine) Caitlin Stamp, MD as Consulting Physician (Obstetrics and Gynecology) [facility], CNM as Midwife (Obstetrics and Gynecology) Caitlin [place], MD as Consulting Physician (Hematology) Caitlin Caster, MD as Consulting Physician (Radiation Oncology) Diagnostic Radiology & Imaging, [facility] as Radiologist (Diagnostic Radiology)  Clinic Day:  01/11/2024  Referring physician: Azalia Leo, MD  I connected with [facility] on 01/11/24 at  8:30 AM EDT by telephone and verified that I am speaking with the correct person using two identifiers.   I discussed the limitations, risks, security and privacy concerns of performing an evaluation and management service by telemedicine and the availability of in-person appointments. I also discussed with the patient that there may be a patient responsible charge related to this service. The patient expressed understanding and agreed to proceed.   Other persons participating in the visit and their role in the encounter: n/a   Patient's location: home   Provider's location: [facility]    Chief Complaint: review biopsy results of left breast mass   ASSESSMENT & PLAN:   Assessment & Plan: Breast cancer of upper-outer quadrant of right female breast (HCC) invasive lobular carcinoma, mpT2N0M0, stage IIA, ER+/PR+/HER2-, G1, RS 18  -diagnosed in 03/2016, s/p right mastectomy. She had multifocal disease, surgical margins and nodes were negative.  -She started anti-estrogen therapy with Tamoxifen  in 11/23/16. She is s/p hysterectomy, ovaries still in place. For the first month she used 10mg  daily. She continues to tolerate well with minimal hot flashes. Her BP still not well controlled and she feels UTIs are related but she wishes to continue Tamoxifen .  -Her 03/2021 [facility] showed she was still premenopausal. Plan to switch her to AI once postmenopausal.  -left screening mammogram on  07/15/21 and breast MRI on 04/09/21 were both negative. Bilateral breast MRI done 12/2022 was negative -Left screening mammogram done 06/2023 was negative. 12/24/2023 Bilateral breast MRI showing normal right chest wall. There is category C fibroglandular tissue. She has a 1.3 linear, non-mass enhancing lesion in posterior left breast. Radiology has recommended a MRI guided biopsy of the new lesion. There are no palpable masses or lumps on today's exam. A STAT order was placed for MRI guided biopsy of new abnormality of left breast.  -continue tamoxifen  daily.  MRI guided biopsy done 01/04/2024 - it was negative for atypia and malignancy     Plan: Reviewed MRI guided biopsy results done 01/04/2024.  -The biopsy yielded usual ductal hyperplasia, small cyst, sclerosing adenosis, fibrous stroma, fibroadenomatoid change, and pseudoangiomatous stromal hyperplasia. The tissue was negative for atypia and malignancy.  -she will return to her routine for screening for breast cancer.  --she is due for unilateral 3D screening mammogram in December.  --She is due for MRI in 12/2023.  Continue tamoxifen  20 mg daily. Labs and follow up in 1 year .   The patient understands the plans discussed today and is in agreement with them.  She knows to contact our office if she develops concerns prior to her next appointment.  I provided 10 minutes of face-to-face time during this encounter and > 50% was spent counseling as documented under my assessment and plan.    Caitlin Deis, NP  [facility]. [facility] [address] [place] [address] Dept: [phone] Dept Fax: [phone]   Orders Placed This Encounter  Procedures  MM 3D SCREENING MAMMOGRAM UNILATERAL LEFT BREAST    Standing Status:   Future    Expected Date:   07/25/2024    Expiration Date:   01/10/2025    Reason for Exam (SYMPTOM  OR DIAGNOSIS REQUIRED):   screening  for breast cancer    Is the patient pregnant?:   No    Preferred imaging location?:   [facility]      CHIEF COMPLAINT:  CC: Right breast cancer, estrogen receptor positive  Current Treatment: Tamoxifen  20 mg started 11/23/2016 (goal of 10 years treatment)  INTERVAL HISTORY:  [facility] is here today for repeat clinical assessment.  She was last seen by me on 12/30/2023.  Reviewed bilateral breast MRI which showed a new, linear, non-mass in the posterior left breast, measuring 1.3 cm.  It had been recommended that she have MRI guided biopsy of this lesion.  Biopsy is negative for atypia and malignancy.  No new concerns or complaints today. She understands that she should return to her usual screening for breast cancer.   I have reviewed the past medical history, past surgical history, social history and family history with the patient and they are unchanged from previous note.  ALLERGIES:  has no known allergies.  MEDICATIONS:  Current Outpatient Medications  Medication Sig Dispense Refill   amLODipine  (NORVASC ) 10 MG tablet Take 1 tablet (10 mg total) by mouth daily.     cephALEXin  (KEFLEX ) 500 MG capsule Take 1 capsule (500 mg total) by mouth 3 (three) times daily.     cholecalciferol (VITAMIN D ) 1000 UNITS tablet Take 6,000 Units by mouth daily.      hydrochlorothiazide  (HYDRODIURIL ) 25 MG tablet Take 1 tablet (25 mg total) by mouth daily.     ibuprofen  (ADVIL ,MOTRIN ) 200 MG tablet Take 200 mg by mouth every 6 (six) hours as needed for mild pain.     L-Tyrosine 500 MG CAPS Take 1 capsule by mouth daily.     loratadine (CLARITIN) 10 MG tablet Take 10 mg by mouth daily.     magnesium 30 MG tablet Take 500 mg by mouth daily.      Omega-3 Fatty Acids (FISH OIL PO) Take 1 capsule by mouth daily.      Semaglutide -Weight Management 0.5 MG/0.5ML SOAJ Inject 0.5 mg into the skin once a week.     tamoxifen  (NOLVADEX ) 20 MG tablet TAKE 1 TABLET(20 MG) BY MOUTH DAILY 90 tablet 1   valACYclovir   (VALTREX ) 1000 MG tablet Take 500-1,000 mg by mouth daily.     venlafaxine  XR (EFFEXOR -XR) 75 MG 24 hr capsule Take 112.5 mg by mouth every morning. Updated by patient     No current facility-administered medications for this visit.    HISTORY OF PRESENT ILLNESS:   Oncology History Overview Note  Breast cancer of upper-outer quadrant of right female breast [facility])   Staging form: Breast, AJCC 7th Edition   - Clinical stage from 04/23/2016: Stage IA (T1c(m), N0, M0) - Signed by Caitlin [place], MD on 04/30/2016   - Pathologic stage from 05/27/2016: Stage IIA (T2(m), N0, cM0) - Unsigned    Breast cancer of upper-outer quadrant of right female breast (HCC)  04/17/2016 Mammogram   Mammogram and US  showed a 2.2cm cyst at 12:00, a 1.6cm lobulated mass at 9:00 and a 6mm mass at 10:00, axilla was negative    04/23/2016 Initial Diagnosis   Breast cancer of upper-outer quadrant of right female breast (HCC)   04/23/2016 Initial Biopsy   Core needle biopsy  of right breast masses at 10:00 and 9:30 positions both showed invasive and insitu mammary carcinoma    04/23/2016 Receptors her2   ER 90%+, PR 80-90%+, HER2-, KI67 10-15%   05/12/2016 Procedure   Genetic testing: Negative. Genes analyzed: ATM, BAP1, BARD1, BRCA1, BRCA2, BRIP1, CDH1, CDK4, CDKN2A, CHEK2, EPCAM, FANCC, MITF, MLH1, MSH2, MSH6, NBN, PALB2, PMS2, PTEN, RAD51C, RAD51D, TP53, and XRCC2   05/27/2016 Surgery   Right breast mastectomy and sentinel lymph node biopsy ([facility])   05/27/2016 Pathology Results   Right breast mastectomy showed multifocal invasive and in situ lobular carcinoma, 3.2, 0.9 and 0.9 cm, G1 margins were negative, 3 sentinel lymph nodes were negative.    05/27/2016 Oncotype testing   RS 18, which predicts 10 year distant recurrence risk of 12% with tamoxifen  (intermediate risk)    11/23/2016 -  Anti-estrogen oral therapy   Tamoxifen  20 mg daily, she started with 10mg  daily for the first month    04/27/2018 Mammogram   Benign    12/23/2023 Imaging   MRI bilateral breast  FINDINGS: Breast composition: c. Heterogeneous fibroglandular tissue.  Background parenchymal enhancement: Mild  No abnormal enhancement along the right chest wall.   Left breast: There is some subtle indeterminate linear non mass enhancement in the outer posterior left breast spanning 1.3 cm (series 12, image 68). No additional suspicious enhancing mass or non mass enhancement elsewhere in the left breast.   Lymph nodes: No abnormal appearing lymph nodes.  Ancillary findings:  None.  IMPRESSION: Indeterminate 1.3 cm area of linear non mass enhancement in the outer posterior left breast.   RECOMMENDATION: MRI guided core needle biopsy x 1 left breast.   BI-RADS CATEGORY  4: Suspicious.   01/04/2024 Pathology Results   Fine needle core biopsy from left breast  USUAL DUCTAL HYPERPLASIA, SMALL CYST, SCLEROSING ADENOSIS, FIBROUS STROMA,       FIBROADENOMATOID CHANGE, AND PSEUDOANGIOMATOUS STROMAL HYPERPLASIA.       - NEGATIVE FOR ATYPIA AND MALIGNANCY.        REVIEW OF SYSTEMS:   Constitutional: Denies fevers, chills or abnormal weight loss Eyes: Denies blurriness of vision Ears, nose, mouth, throat, and face: Denies mucositis or sore throat Respiratory: Denies cough, dyspnea or wheezes Cardiovascular: Denies palpitation, chest discomfort or lower extremity swelling Gastrointestinal:  Denies nausea, heartburn or change in bowel habits Skin: Denies abnormal skin rashes Lymphatics: Denies new lymphadenopathy or easy bruising Neurological:Denies numbness, tingling or new weaknesses Behavioral/Psych: Mood is stable, no new changes  All other systems were reviewed with the patient and are negative.   VITALS:   There were no vitals taken for this visit.  Wt Readings from Last 3 Encounters:  12/30/23 198 lb 11.2 oz (90.1 kg)  12/30/22 205 lb 3.2 oz (93.1 kg)  06/26/22 202 lb 3 oz (91.7 kg)    There is no height or weight on file to  calculate BMI.  Performance status (ECOG): 0 - Asymptomatic  PHYSICAL EXAM:   I have reviewed the data as listed    Component Value Date/Time   NA 138 12/30/2023 0845   NA 138 06/29/2020 1121   NA 138 04/16/2017 0852   K 4.1 12/30/2023 0845   K 4.5 04/16/2017 0852   CL 103 12/30/2023 0845   CO2 25 12/30/2023 0845   CO2 24 04/16/2017 0852   GLUCOSE 109 (H) 12/30/2023 0845   GLUCOSE 95 04/16/2017 0852   BUN 20 12/30/2023 0845   BUN 18 06/29/2020 1121   BUN 15.9 04/16/2017  1610   CREATININE 0.88 12/30/2023 0845   CREATININE 0.8 04/16/2017 0852   CALCIUM 8.9 12/30/2023 0845   CALCIUM 8.9 04/16/2017 0852   PROT 7.0 12/30/2023 0845   PROT 7.0 06/29/2020 1121   PROT 7.1 04/16/2017 0852   ALBUMIN 4.5 12/30/2023 0845   ALBUMIN 4.5 06/29/2020 1121   ALBUMIN 3.9 04/16/2017 0852   AST 20 12/30/2023 0845   AST 17 04/16/2017 0852   ALT 16 12/30/2023 0845   ALT 14 04/16/2017 0852   ALKPHOS 38 12/30/2023 0845   ALKPHOS 38 (L) 04/16/2017 0852   BILITOT 0.6 12/30/2023 0845   BILITOT 0.60 04/16/2017 0852   GFRNONAA >60 12/30/2023 0845   GFRNONAA >89 07/19/2014 0901   GFRAA 82 06/29/2020 1121   GFRAA >89 07/19/2014 0901    Lab Results  Component Value Date   WBC 6.9 12/30/2023   NEUTROABS 3.9 12/30/2023   HGB 14.3 12/30/2023   HCT 40.3 12/30/2023   MCV 89.6 12/30/2023   PLT 368 12/30/2023    RADIOGRAPHIC STUDIES: MR LT BREAST BX W LOC DEV 1ST LESION IMAGE BX SPEC MR GUIDE Addendum Date: 01/05/2024 ADDENDUM REPORT: 01/05/2024 10:34 ADDENDUM: Pathology revealed USUAL DUCTAL HYPERPLASIA, SMALL CYST, SCLEROSING ADENOSIS, FIBROUS STROMA, FIBROADENOMATOID CHANGE, AND PSEUDOANGIOMATOUS STROMAL HYPERPLASIA of the LEFT breast, posterior outer, (cylinder clip). This was found to be concordant by Dr. Severiano Danish. Pathology results were discussed with the patient by telephone. The patient reported doing well after the biopsy with tenderness at the site. Post biopsy instructions and care were  reviewed and questions were answered. The patient was encouraged to call [facility] for any additional concerns. My direct phone number was provided. The patient was instructed to return for annual screening mammography and high risk MRI. Pathology results reported by Kraig Peru, RN on 01/05/2024. Electronically Signed   By: Sundra Engel M.D.   On: 01/05/2024 10:34   Result Date: 01/05/2024 CLINICAL DATA:  58 year old female presents for tissue sampling of 1.3 cm posterior OUTER LEFT breast non masslike enhancement. EXAM: MRI GUIDED CORE NEEDLE BIOPSY OF THE LEFT BREAST TECHNIQUE: Multiplanar, multisequence MR imaging of the LEFT breast was performed both before and after administration of intravenous contrast. CONTRAST:  9 cc Vueway  COMPARISON:  Previous exam(s). FINDINGS: I met with the patient, and we discussed the procedure of MRI guided biopsy, including risks, benefits, and alternatives. Specifically, we discussed the risks of infection, bleeding, tissue injury, clip migration, and inadequate sampling. Informed, written consent was given. The usual time out protocol was performed immediately prior to the procedure. Using sterile technique, 1% Lidocaine  with and without epinephrine , MRI guidance, and a 9 gauge vacuum assisted device, biopsy was performed of the 1.3 cm area of posterior OUTER LEFT breast non masslike enhancement using a LATERAL approach. At the conclusion of the procedure, a a CYLINDER tissue marker clip was deployed into the biopsy cavity. Follow-up 2-view mammogram was performed and dictated separately. IMPRESSION: MRI guided biopsy of 1.3 cm posterior OUTER LEFT breast non masslike enhancement. No apparent complications. Electronically Signed: By: Sundra Engel M.D. On: 01/04/2024 09:20   MM CLIP PLACEMENT LEFT Result Date: 01/04/2024 CLINICAL DATA:  Evaluate CYLINDER biopsy clip placement following MR guided LEFT breast biopsy. EXAM: 3D DIAGNOSTIC LEFT  MAMMOGRAM POST MRI BIOPSY COMPARISON:  Previous exam(s). ACR Breast Density Category c: The breasts are heterogeneously dense, which may obscure small masses. FINDINGS: 3D Mammographic images were obtained following MR guided biopsy of 1.3 cm posterior OUTER LEFT  breast non masslike enhancement. The CYLINDER biopsy marking clip is in expected position at the site of biopsy. A small post biopsy hematoma is noted. IMPRESSION: Appropriate positioning of the CYLINDER shaped biopsy marking clip at the site of biopsy in the posterior UPPER-OUTER LEFT breast. Small post biopsy hematoma. Final Assessment: Post Procedure Mammograms for Marker Placement Electronically Signed   By: Sundra Engel M.D.   On: 01/04/2024 09:32   MR BREAST BILATERAL W WO CONTRAST INC CAD Result Date: 12/29/2023 CLINICAL DATA:  High risk screening. History of right breast cancer status post mastectomy in 2017. EXAM: BILATERAL BREAST MRI WITH AND WITHOUT CONTRAST TECHNIQUE: Multiplanar, multisequence MR images of both breasts were obtained prior to and following the intravenous administration of 9 ml of Vueway  Three-dimensional MR images were rendered by post-processing of the original MR data on an independent workstation. The three-dimensional MR images were interpreted, and findings are reported in the following complete MRI report for this study. Three dimensional images were evaluated at the independent interpreting workstation using the DynaCAD thin client. COMPARISON:  Previous exam(s). FINDINGS: Breast composition: c. Heterogeneous fibroglandular tissue. Background parenchymal enhancement: Mild No abnormal enhancement along the right chest wall. Left breast: There is some subtle indeterminate linear non mass enhancement in the outer posterior left breast spanning 1.3 cm (series 12, image 68). No additional suspicious enhancing mass or non mass enhancement elsewhere in the left breast. Lymph nodes: No abnormal appearing lymph nodes. Ancillary  findings:  None. IMPRESSION: Indeterminate 1.3 cm area of linear non mass enhancement in the outer posterior left breast. RECOMMENDATION: MRI guided core needle biopsy x 1 left breast. BI-RADS CATEGORY  4: Suspicious. Electronically Signed   By: Allena Ito M.D.   On: 12/29/2023 15:01

## 2024-01-11 ENCOUNTER — Inpatient Hospital Stay (HOSPITAL_BASED_OUTPATIENT_CLINIC_OR_DEPARTMENT_OTHER): Admitting: Nurse Practitioner

## 2024-01-11 ENCOUNTER — Encounter: Payer: Self-pay | Admitting: Nurse Practitioner

## 2024-01-11 DIAGNOSIS — Z1721 Progesterone receptor positive status: Secondary | ICD-10-CM | POA: Diagnosis not present

## 2024-01-11 DIAGNOSIS — Z1732 Human epidermal growth factor receptor 2 negative status: Secondary | ICD-10-CM | POA: Diagnosis not present

## 2024-01-11 DIAGNOSIS — Z170 Estrogen receptor positive status [ER+]: Secondary | ICD-10-CM

## 2024-01-11 DIAGNOSIS — C50411 Malignant neoplasm of upper-outer quadrant of right female breast: Secondary | ICD-10-CM | POA: Diagnosis not present

## 2024-01-11 DIAGNOSIS — Z9011 Acquired absence of right breast and nipple: Secondary | ICD-10-CM

## 2024-01-11 DIAGNOSIS — Z1231 Encounter for screening mammogram for malignant neoplasm of breast: Secondary | ICD-10-CM

## 2024-07-22 ENCOUNTER — Other Ambulatory Visit: Payer: Self-pay | Admitting: Hematology

## 2024-07-22 DIAGNOSIS — C50411 Malignant neoplasm of upper-outer quadrant of right female breast: Secondary | ICD-10-CM

## 2024-08-23 ENCOUNTER — Encounter: Payer: Self-pay | Admitting: Hematology

## 2024-09-07 ENCOUNTER — Encounter: Payer: Self-pay | Admitting: Hematology

## 2025-01-11 ENCOUNTER — Other Ambulatory Visit

## 2025-01-11 ENCOUNTER — Ambulatory Visit: Admitting: Nurse Practitioner
